# Patient Record
Sex: Female | Born: 1955 | Race: Black or African American | Hispanic: No | Marital: Married | Smoking: Never smoker
Health system: Southern US, Community
[De-identification: ages and names within clinical notes are randomized; demographics above are authoritative.]

## PROBLEM LIST (undated history)

## (undated) DIAGNOSIS — T699XXD Effect of reduced temperature, unspecified, subsequent encounter: Secondary | ICD-10-CM

## (undated) DIAGNOSIS — E669 Obesity, unspecified: Secondary | ICD-10-CM

## (undated) DIAGNOSIS — S86019A Strain of unspecified Achilles tendon, initial encounter: Secondary | ICD-10-CM

## (undated) DIAGNOSIS — D890 Polyclonal hypergammaglobulinemia: Secondary | ICD-10-CM

## (undated) DIAGNOSIS — D649 Anemia, unspecified: Principal | ICD-10-CM

## (undated) DIAGNOSIS — I2699 Other pulmonary embolism without acute cor pulmonale: Secondary | ICD-10-CM

## (undated) DIAGNOSIS — B009 Herpesviral infection, unspecified: Secondary | ICD-10-CM

## (undated) DIAGNOSIS — J45909 Unspecified asthma, uncomplicated: Secondary | ICD-10-CM

## (undated) DIAGNOSIS — R7303 Prediabetes: Secondary | ICD-10-CM

## (undated) DIAGNOSIS — Z86718 Personal history of other venous thrombosis and embolism: Secondary | ICD-10-CM

## (undated) DIAGNOSIS — J189 Pneumonia, unspecified organism: Secondary | ICD-10-CM

## (undated) DIAGNOSIS — I10 Essential (primary) hypertension: Secondary | ICD-10-CM

## (undated) DIAGNOSIS — E559 Vitamin D deficiency, unspecified: Secondary | ICD-10-CM

## (undated) DIAGNOSIS — M1990 Unspecified osteoarthritis, unspecified site: Secondary | ICD-10-CM

## (undated) DIAGNOSIS — R609 Edema, unspecified: Secondary | ICD-10-CM

## (undated) HISTORY — DX: Edema, unspecified: R60.9

## (undated) HISTORY — DX: Vitamin D deficiency, unspecified: E55.9

## (undated) HISTORY — DX: Herpesviral infection, unspecified: B00.9

## (undated) HISTORY — DX: Essential (primary) hypertension: I10

## (undated) HISTORY — DX: Strain of unspecified achilles tendon, initial encounter: S86.019A

## (undated) HISTORY — PX: BREAST BIOPSY: SHX20

## (undated) HISTORY — DX: Other pulmonary embolism without acute cor pulmonale: I26.99

## (undated) HISTORY — DX: Polyclonal hypergammaglobulinemia: D89.0

## (undated) HISTORY — DX: Obesity, unspecified: E66.9

## (undated) HISTORY — DX: Anemia, unspecified: D64.9

## (undated) HISTORY — DX: Personal history of other venous thrombosis and embolism: Z86.718

---

## 2006-04-17 ENCOUNTER — Ambulatory Visit: Payer: Self-pay | Admitting: Hematology & Oncology

## 2006-05-03 LAB — PROTIME-INR

## 2006-06-12 ENCOUNTER — Encounter: Payer: Self-pay | Admitting: Sports Medicine

## 2006-06-20 ENCOUNTER — Ambulatory Visit: Payer: Self-pay | Admitting: Oncology

## 2006-07-14 ENCOUNTER — Ambulatory Visit: Payer: Self-pay | Admitting: Sports Medicine

## 2006-07-14 DIAGNOSIS — IMO0002 Reserved for concepts with insufficient information to code with codable children: Secondary | ICD-10-CM

## 2006-07-14 DIAGNOSIS — M1710 Unilateral primary osteoarthritis, unspecified knee: Secondary | ICD-10-CM | POA: Insufficient documentation

## 2006-07-24 LAB — CBC WITH DIFFERENTIAL/PLATELET
Basophils Absolute: 0 10*3/uL (ref 0.0–0.1)
EOS%: 1.8 % (ref 0.0–7.0)
Eosinophils Absolute: 0.2 10*3/uL (ref 0.0–0.5)
HGB: 11.3 g/dL — ABNORMAL LOW (ref 11.6–15.9)
MCH: 28.3 pg (ref 26.0–34.0)
NEUT#: 6.6 10*3/uL — ABNORMAL HIGH (ref 1.5–6.5)
RDW: 18 % — ABNORMAL HIGH (ref 11.3–14.5)
lymph#: 2.5 10*3/uL (ref 0.9–3.3)

## 2006-07-24 LAB — MORPHOLOGY: PLT EST: ADEQUATE

## 2006-07-28 LAB — BETA-2 GLYCOPROTEIN ANTIBODIES
Beta-2-Glycoprotein I IgA: 5 U/mL (ref <10)
Beta-2-Glycoprotein I IgM: <4 U/mL (ref <10)

## 2006-09-21 ENCOUNTER — Ambulatory Visit: Payer: Self-pay | Admitting: Oncology

## 2006-09-28 LAB — FACTOR 8 ASSAY: Coagulation Factor VIII: 249 % — ABNORMAL HIGH (ref 73–140)

## 2007-01-17 ENCOUNTER — Encounter: Admission: RE | Admit: 2007-01-17 | Discharge: 2007-01-17 | Payer: Self-pay | Admitting: Family Medicine

## 2007-01-24 ENCOUNTER — Ambulatory Visit: Payer: Self-pay | Admitting: Oncology

## 2007-01-29 LAB — CBC WITH DIFFERENTIAL/PLATELET
Basophils Absolute: 0 10*3/uL (ref 0.0–0.1)
EOS%: 4.8 % (ref 0.0–7.0)
Eosinophils Absolute: 0.4 10*3/uL (ref 0.0–0.5)
HCT: 35.2 % (ref 34.8–46.6)
HGB: 11.6 g/dL (ref 11.6–15.9)
MCH: 28.6 pg (ref 26.0–34.0)
NEUT#: 5 10*3/uL (ref 1.5–6.5)
NEUT%: 54.3 % (ref 39.6–76.8)
lymph#: 2.9 10*3/uL (ref 0.9–3.3)

## 2007-01-30 ENCOUNTER — Encounter: Admission: RE | Admit: 2007-01-30 | Discharge: 2007-01-30 | Payer: Self-pay | Admitting: Family Medicine

## 2007-01-30 LAB — FACTOR 8 ASSAY: Coagulation Factor VIII: 266 % — ABNORMAL HIGH (ref 73–140)

## 2007-03-04 ENCOUNTER — Emergency Department (HOSPITAL_COMMUNITY): Admission: EM | Admit: 2007-03-04 | Discharge: 2007-03-05 | Payer: Self-pay | Admitting: Emergency Medicine

## 2007-04-25 ENCOUNTER — Ambulatory Visit: Payer: Self-pay | Admitting: Sports Medicine

## 2007-05-22 ENCOUNTER — Ambulatory Visit: Payer: Self-pay | Admitting: Sports Medicine

## 2007-05-22 DIAGNOSIS — S93499A Sprain of other ligament of unspecified ankle, initial encounter: Secondary | ICD-10-CM

## 2007-05-22 DIAGNOSIS — S96819A Strain of other specified muscles and tendons at ankle and foot level, unspecified foot, initial encounter: Secondary | ICD-10-CM

## 2007-08-21 ENCOUNTER — Ambulatory Visit: Payer: Self-pay | Admitting: Sports Medicine

## 2008-01-25 ENCOUNTER — Ambulatory Visit: Payer: Self-pay | Admitting: Oncology

## 2008-02-01 LAB — CBC WITH DIFFERENTIAL/PLATELET
BASO%: 0.5 % (ref 0.0–2.0)
Basophils Absolute: 0 10*3/uL (ref 0.0–0.1)
EOS%: 3 % (ref 0.0–7.0)
LYMPH%: 25 % (ref 14.0–48.0)
MCH: 27.8 pg (ref 26.0–34.0)
MONO#: 0.5 10*3/uL (ref 0.1–0.9)
NEUT#: 4.9 10*3/uL (ref 1.5–6.5)
Platelets: 403 10*3/uL — ABNORMAL HIGH (ref 145–400)
RDW: 16.5 % — ABNORMAL HIGH (ref 11.3–14.5)
lymph#: 1.9 10*3/uL (ref 0.9–3.3)

## 2008-02-01 LAB — PROTHROMBIN TIME: INR: 2.5 — ABNORMAL HIGH (ref 0.0–1.5)

## 2008-02-06 LAB — IRON AND TIBC
%SAT: 13 % — ABNORMAL LOW (ref 20–55)
Iron: 43 ug/dL (ref 42–145)
TIBC: 328 ug/dL (ref 250–470)

## 2008-02-06 LAB — IMMUNOFIXATION ELECTROPHORESIS: IgM, Serum: 95 mg/dL (ref 60–263)

## 2008-02-19 LAB — ANA: Anti Nuclear Antibody(ANA): NEGATIVE

## 2008-02-19 LAB — HEPATITIS C ANTIBODY: HCV Ab: NEGATIVE

## 2008-02-19 LAB — HEPATIC FUNCTION PANEL
ALT: 10 U/L (ref 0–35)
Alkaline Phosphatase: 73 U/L (ref 39–117)
Bilirubin, Direct: 0.1 mg/dL (ref 0.0–0.3)
Total Bilirubin: 0.4 mg/dL (ref 0.3–1.2)

## 2008-03-18 ENCOUNTER — Encounter: Admission: RE | Admit: 2008-03-18 | Discharge: 2008-03-18 | Payer: Self-pay | Admitting: Family Medicine

## 2008-04-10 ENCOUNTER — Other Ambulatory Visit: Admission: RE | Admit: 2008-04-10 | Discharge: 2008-04-10 | Payer: Self-pay | Admitting: Family Medicine

## 2008-05-22 ENCOUNTER — Ambulatory Visit: Payer: Self-pay | Admitting: Oncology

## 2008-05-26 LAB — IRON AND TIBC
%SAT: 23 % (ref 20–55)
Iron: 69 ug/dL (ref 42–145)

## 2008-05-26 LAB — CBC & DIFF AND RETIC
BASO%: 0.1 % (ref 0.0–2.0)
Basophils Absolute: 0 10*3/uL (ref 0.0–0.1)
EOS%: 3.1 % (ref 0.0–7.0)
Eosinophils Absolute: 0.2 10*3/uL (ref 0.0–0.5)
LYMPH%: 22.4 % (ref 14.0–49.7)
MONO#: 0.4 10*3/uL (ref 0.1–0.9)
MONO%: 6 % (ref 0.0–14.0)
NEUT%: 68.4 % (ref 38.4–76.8)
Platelets: 309 10*3/uL (ref 145–400)
RBC: 4.03 10*6/uL (ref 3.70–5.45)
WBC: 7.3 10*3/uL (ref 3.9–10.3)
lymph#: 1.6 10*3/uL (ref 0.9–3.3)

## 2008-05-26 LAB — FERRITIN: Ferritin: 27 ng/mL (ref 10–291)

## 2008-05-26 LAB — MORPHOLOGY: PLT EST: ADEQUATE

## 2008-11-14 ENCOUNTER — Ambulatory Visit: Payer: Self-pay | Admitting: Oncology

## 2008-11-20 LAB — CBC & DIFF AND RETIC
EOS%: 5.4 % (ref 0.0–7.0)
Eosinophils Absolute: 0.5 10*3/uL (ref 0.0–0.5)
Immature Retic Fract: 6.5 % (ref 0.00–10.70)
LYMPH%: 25.6 % (ref 14.0–49.7)
MONO#: 0.8 10*3/uL (ref 0.1–0.9)
MONO%: 8.5 % (ref 0.0–14.0)
Platelets: 304 10*3/uL (ref 145–400)
RBC: 4.1 10*6/uL (ref 3.70–5.45)
Retic %: 1.23 % (ref 0.50–1.50)
lymph#: 2.3 10*3/uL (ref 0.9–3.3)

## 2008-11-20 LAB — MORPHOLOGY: PLT EST: ADEQUATE

## 2008-11-21 LAB — IRON AND TIBC
Iron: 41 ug/dL — ABNORMAL LOW (ref 42–145)
UIBC: 278 ug/dL

## 2008-12-17 ENCOUNTER — Ambulatory Visit: Payer: Self-pay | Admitting: Oncology

## 2009-07-01 ENCOUNTER — Encounter: Admission: RE | Admit: 2009-07-01 | Discharge: 2009-07-01 | Payer: Self-pay | Admitting: Family Medicine

## 2009-12-09 ENCOUNTER — Ambulatory Visit: Payer: Self-pay | Admitting: Sports Medicine

## 2009-12-09 DIAGNOSIS — M170 Bilateral primary osteoarthritis of knee: Secondary | ICD-10-CM

## 2009-12-09 DIAGNOSIS — M794 Hypertrophy of (infrapatellar) fat pad: Secondary | ICD-10-CM | POA: Insufficient documentation

## 2010-01-08 ENCOUNTER — Ambulatory Visit: Payer: Self-pay | Admitting: Oncology

## 2010-04-04 ENCOUNTER — Encounter: Payer: Self-pay | Admitting: Family Medicine

## 2010-04-13 NOTE — Assessment & Plan Note (Signed)
Summary: b knee pain x 4-5 mos   Vital Signs:  Patient profile:   55 year old female BP sitting:   124 / 82  Vitals Entered By: Lillia Pauls CMA (December 09, 2009 10:36 AM)  History of Present Illness: 55 yo F here for b/l knee pain.  Knows she has OA in both knees.  Present worse x 4-5 months.  Worse throughout the day  with prolonged standing and walking.  Pain described as achy. Radiates down to her legs as well.  Better with rest. No other new injuries.  No swelling.  No locking/instability.  + popping/clicking. Has had xrays on L knee 3 years ago.  Had gotten CSI at that time and was better. Had some leftover tramadol that is working well.  Possibly intersted in taking daily med for he knees, cannot do NSAIDs because on coumadin. Possibly considering CSI. going to [employer] for weight lifting. Unable to walk for exercise 2/2 knee pain. Also has large anterior leg protrusion that she states has been ultrasounded and it was a "fat pad." Has only had that 1 CSI on L knee, none on R knee.  Physical Exam  General:  overweight-appearing.   Msk:  Knee: Normal to inspection with no erythema or effusion or obvious bony abnormalities. Palpation normal with no warmth.  Mild B/l medial joint line tenderness, no patellar tenderness or condyle tenderness. ROM normal in flexion and extension and lower leg rotation. Ligaments with solid consistent endpoints including ACL, PCL, LCL, MCL. Negative Mcmurray's and provocative meniscal tests. Non painful patellar compression. Patellar and quadriceps tendons unremarkable.  L anterior lower leg with large protrusion that is easily mobile and non tender.    Impression & Recommendations:  Problem # 1:  OSTEOARTHRITIS, KNEES, BILATERAL (ICD-715.96) Got great relief from 1st CSI, but would like to just try meds at this time, though admits to not liking daily medication. - Start tramadol 50 mg two times a day with 1-2 tabs as needed during the day  #90 - Declined CSI b/l today, but will RTC 1 month if decides she wants it - Counseled on continued weight loss, recommended recumbent bike and swimming/water aerobics. - f/u 1 month if no better or would like CSI  Her updated medication list for this problem includes:    Tramadol Hcl 50 Mg Tabs (Tramadol hcl) .Marland Kitchen... 1 by mouth two times a day, then 1-2 as needed during the day.  no more than 4 tabs per day.  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  Complete Medication List: 1)  Voltaren 1 % Gel (Diclofenac sodium) .... Apply 1/4 to 1/2 gram two times a day  to qid as needed to area of heel pain amount: 100 gm rf1 2)  Nitroglycerin 0.2 Mg/hr Pt24 (Nitroglycerin) .... Cut patch in quarters and apply to left achilles tendon in the morning and remove at bedtime. dispense enough for 3 months. 3)  Tramadol Hcl 50 Mg Tabs (Tramadol hcl) .Marland Kitchen.. 1 by mouth two times a day, then 1-2 as needed during the day.  no more than 4 tabs per day. Prescriptions: TRAMADOL HCL 50 MG  TABS (TRAMADOL HCL) 1 by mouth two times a day, then 1-2 as needed during the day.  No more than 4 tabs per day.  #90 x 3   Entered and Authorized by:   Corbin Ade MD  Signed by:   Corbin Ade MD on 12/09/2009   Method used:   Electronically to        [id number] (retail)       [address]       [place], [place]  [address]       Ph: [phone]       Fax: [phone]   RxID:   [id number]

## 2010-08-16 ENCOUNTER — Encounter: Payer: Self-pay | Admitting: Family Medicine

## 2010-08-16 ENCOUNTER — Ambulatory Visit (INDEPENDENT_AMBULATORY_CARE_PROVIDER_SITE_OTHER): Payer: BC Managed Care – PPO | Admitting: Family Medicine

## 2010-08-16 VITALS — BP 115/78 | HR 75 | Ht 66.5 in | Wt 260.0 lb

## 2010-08-16 DIAGNOSIS — M1710 Unilateral primary osteoarthritis, unspecified knee: Secondary | ICD-10-CM

## 2010-08-16 MED ORDER — TRAMADOL HCL 50 MG PO TABS: ORAL_TABLET | ORAL | Status: DC

## 2010-08-17 NOTE — Assessment & Plan Note (Signed)
B CSI 08/2010

## 2010-08-17 NOTE — Progress Notes (Signed)
  Subjective:    Patient ID: Laura [name], female    DOB: 24-Apr-1955, 55 y.o.   MRN: [id number]  HPI Bilateral knee pain that is chronic but worse over the last 2-3 months. The right is worse than the left. Pain is centered in the knee joint and does not radiate. She's noticed no locking or giving way. Pain is 4-6/10 most of the time. It occasionally awakens her from sleep. She has previously had a corticosteroid injection that actually helped for one to 2 years. She would like bilateral corticosteroid injections today if possible.   Review of Systems    denies recent fevers sweats or chills. No recent weight gain or weight loss. Objective:   Physical Exam    GENERAL: Well-developed obese female no acute distress KNEE: Bilaterally she has external changes of osteoarthritis including synovial thickening. She also has fat pad hypertrophy greater on the left than the right. Crepitus bilaterally greater on the left. He sneezed ligamentously intact. There is no effusion. She has full extension full flexion. Negative McMurray.  INJECTION: Patient was given informed consent, signed copy in the chart. Appropriate time out was taken. Area prepped and draped in usual sterile fashion. 1  cc of kenalog plus  4 cc of lidocaine was injected into the bilateral using a(n) anterior approach. The patient tolerated the procedure well. There were no complications. Post procedure instructions were given.     Assessment & Plan:  #1: Bilateral knee osteoarthritis. We discussed how weight loss would improve her long-term outcome. She has previously had good results from steroid injection and has not had one for several years. We discussed at length the evolution of osteoarthritis in the knee and use of corticosteroid treatment. She is aware that she cannot have injection more often than every 3 months but should go as long as possible in between injections. We also discussed pain management. She is only been taking  one or 2 of the 50 mg tramadol tablets a day. We will increase that dose and frequency as needed I have given her a new prescription. Bilateral steroid injections as above. She will followup when necessary.

## 2010-10-20 ENCOUNTER — Other Ambulatory Visit: Payer: Self-pay | Admitting: Family Medicine

## 2010-10-20 DIAGNOSIS — Z1231 Encounter for screening mammogram for malignant neoplasm of breast: Secondary | ICD-10-CM

## 2010-11-22 ENCOUNTER — Ambulatory Visit: Payer: BC Managed Care – PPO

## 2010-12-17 LAB — DIFFERENTIAL
Basophils Absolute: 0
Basophils Relative: 0
Eosinophils Absolute: 0.4
Eosinophils Relative: 5
Lymphocytes Relative: 26
Lymphs Abs: 2.3
Monocytes Absolute: 0.8
Monocytes Relative: 9
Neutro Abs: 5.4
Neutrophils Relative %: 61

## 2010-12-17 LAB — I-STAT 8, (EC8 V) (CONVERTED LAB)
Bicarbonate: 25.2 — ABNORMAL HIGH
Glucose, Bld: 110 — ABNORMAL HIGH
Potassium: 3.9
TCO2: 26
pH, Ven: 7.467 — ABNORMAL HIGH

## 2010-12-17 LAB — CBC
Hemoglobin: 10.9 — ABNORMAL LOW
RBC: 3.89

## 2010-12-17 LAB — PROTIME-INR
INR: 2 — ABNORMAL HIGH
Prothrombin Time: 23.5 — ABNORMAL HIGH

## 2011-02-24 ENCOUNTER — Ambulatory Visit
Admission: RE | Admit: 2011-02-24 | Discharge: 2011-02-24 | Disposition: A | Discharge status: Home / Self Care | Payer: BC Managed Care – PPO | Source: Ambulatory Visit | Attending: Family Medicine | Admitting: Family Medicine

## 2011-02-24 ENCOUNTER — Other Ambulatory Visit: Payer: Self-pay | Admitting: Family Medicine

## 2011-02-24 DIAGNOSIS — J189 Pneumonia, unspecified organism: Secondary | ICD-10-CM

## 2011-02-24 DIAGNOSIS — R0602 Shortness of breath: Secondary | ICD-10-CM

## 2011-02-24 MED ORDER — IOHEXOL 300 MG/ML  SOLN
75.0000 mL | Freq: Once | INTRAMUSCULAR | Status: AC | PRN
  Administered 2011-02-24: 75 mL via INTRAVENOUS

## 2011-03-30 ENCOUNTER — Encounter: Payer: Self-pay | Admitting: Internal Medicine

## 2011-03-30 ENCOUNTER — Telehealth: Payer: Self-pay

## 2011-03-30 ENCOUNTER — Ambulatory Visit (INDEPENDENT_AMBULATORY_CARE_PROVIDER_SITE_OTHER): Payer: BC Managed Care – PPO | Admitting: Internal Medicine

## 2011-03-30 VITALS — BP 124/84 | HR 89 | Temp 97.0°F | Ht 67.0 in | Wt 260.4 lb

## 2011-03-30 DIAGNOSIS — R0600 Dyspnea, unspecified: Secondary | ICD-10-CM

## 2011-03-30 DIAGNOSIS — R0609 Other forms of dyspnea: Secondary | ICD-10-CM

## 2011-03-30 NOTE — Progress Notes (Signed)
Subjective:    Patient ID: Laura [name], female    DOB: 02/03/1956, 56 y.o.   MRN: [id number]  HPI  IOV 03/30/2011  56 year old female. Non-smoker. 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 Has hx of 2 spontaneous  PE  (left lung per hx in 2002 and 2006 - Rx in [facility] [place], sees Dr Cyndie Chime, on lifelong coumadin, Factor '9 leidin" deficiency)  States in Nov 2012 developed cold and after that noticed dyspnea. Saw PMD cxr showed 'pna' and given abx. FU cxr 1 month later showed pna did not clear and so had CT chest and that still showed 'pna' (02/24/11) and had 2nd round of antibiotics. Dyspnea somewhat better but still dyspneic climbing steps and relieved by rest. Rates symptoms as mild (was moderate). But concerned that on fu cxr 03/27/11 abnormalities still persist.  Denies cough. Noted to on ACE inhibitor for bp. No asthma.   REview of radiology shows normal cxr 03/04/2007. BUt in 01/25/2011 there is left diaphragm elevation that persists to this day 03/28/2011. Emailed Dr Fredirick Lathe for her opinion who feels this c/w atelectasis related to diaphragm elevation and no mass seen or mediastinal tumor. Her opinion is slightly different which I agree with is different from Walking her in office today: 185 feet x 3 laps; lowest pulse ox is 90%   Past Medical History  Diagnosis Date  . High blood pressure   . History of blood clots      Family History  Problem Relation Age of Onset  . Multiple myeloma Mother 82    deceased  . Prostate cancer Father      History   Social History  . Marital Status: Single    Spouse Name: N/A    Number of Children: 2  . 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 Smoking status: Never Smoker   . Smokeless tobacco: Never Used  . Alcohol Use: No  . Drug Use: No  . Sexually Active: Not on file   Other Topics Concern  . Not on file   Social History Narrative  . No narrative on file     No Known Allergies    Outpatient Prescriptions Prior to Visit  Medication Sig Dispense Refill  . traMADol (ULTRAM) 50 MG tablet 1-2 tabs by mouth bid / tid prn knee pain  90 tablet  3  . warfarin (COUMADIN) 3 MG tablet Take 3 mg by mouth daily.        Marland Kitchen warfarin (COUMADIN) 4 MG tablet Take 4 mg by mouth daily.                .    Review of Systems  Constitutional: Negative for fever and unexpected weight change.  HENT: Negative for ear pain, nosebleeds, congestion, sore throat, rhinorrhea, sneezing, trouble swallowing, dental problem, postnasal drip and sinus pressure.   Eyes: Negative for redness and itching.  Respiratory: Positive for shortness of breath. Negative for cough, chest tightness and wheezing.   Cardiovascular: Negative for palpitations and leg swelling.  Gastrointestinal: Negative for nausea and vomiting.  Genitourinary: Negative for dysuria.  Musculoskeletal: Negative for joint swelling.  Skin: Negative for rash.  Neurological: Negative for headaches.  Hematological: Bruises/bleeds easily.  Psychiatric/Behavioral: Negative for dysphoric mood. The patient is not nervous/anxious.        Objective:   Physical Exam  Vitals reviewed. Constitutional: She is oriented to person, place, and time. She appears well-developed and  well-nourished. No distress.  HENT:  Head: Normocephalic and atraumatic.  Right Ear: External ear normal.  Left Ear: External ear normal.  Mouth/Throat: Oropharynx is clear and moist. No oropharyngeal exudate.  Eyes: Conjunctivae and EOM are normal. Pupils are equal, round, and reactive to light. Right eye exhibits no discharge. Left eye exhibits no discharge. No scleral icterus.  Neck: Normal range of motion. Neck supple. No JVD present. No tracheal deviation present. No thyromegaly present.  Cardiovascular: Normal rate, regular rhythm, normal heart sounds and intact distal pulses.  Exam reveals no gallop and no friction rub.   No murmur  heard. Pulmonary/Chest: Effort normal. No respiratory distress. She has no wheezes. She has no rales. She exhibits no tenderness.       Diminished on left base   Abdominal: Soft. Bowel sounds are normal. She exhibits no distension and no mass. There is no tenderness. There is no rebound and no guarding.  Musculoskeletal: Normal range of motion. She exhibits no edema and no tenderness.  Lymphadenopathy:    She has no cervical adenopathy.  Neurological: She is alert and oriented to person, place, and time. She has normal reflexes. No cranial nerve deficit. She exhibits normal muscle tone. Coordination normal.  Skin: Skin is warm and dry. No rash noted. She is not diaphoretic. No erythema. No pallor.  Psychiatric: She has a normal mood and affect. Her behavior is normal. Judgment and thought content normal.          Assessment & Plan:

## 2011-03-30 NOTE — Patient Instructions (Addendum)
I will talk to one of our raddiologist and get back to you over next several days Today my nurse will walk you and test for oxygen levels  ................ Post ov plan  - get sniff test  - get full pft  - fu after above

## 2011-03-30 NOTE — Telephone Encounter (Signed)
Consult w/ MR was just cancelled for today- I am calling shirley to advise of this opening. Hazel Sams

## 2011-04-01 ENCOUNTER — Encounter: Payer: Self-pay | Admitting: Internal Medicine

## 2011-04-01 ENCOUNTER — Telehealth: Payer: Self-pay | Admitting: Internal Medicine

## 2011-04-01 DIAGNOSIS — R0600 Dyspnea, unspecified: Secondary | ICD-10-CM | POA: Insufficient documentation

## 2011-04-01 NOTE — Assessment & Plan Note (Signed)
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 I suspect that the pulmonary opacity in left lower lobe is dependent atlectasis. Not sure why she has raise left hemidiphragm on left side. No obvious tumor. Will get PFT and SNIFF test and review  Note: plan created after she was seen in office.

## 2011-04-01 NOTE — Telephone Encounter (Signed)
Attempted to reach patient on cell/home phone x 2 - went to some one else. Called her at work but she was in meeting and someone else picked up and for privacy reasons did not reveal that it was MD calling  Please let patient know that I d/w radiologist and we feel that the left diaprhagm is weak and is unlikely pneumonia. Please arrange for SNIFF test and full PFT and come back and see me after that  Need her to get records from [facility] about her PE. She might says that is wiuth Dr Sheldon Silvan here about the PE

## 2011-04-05 NOTE — Telephone Encounter (Signed)
[email or web address].Nary Sneed, CMA  

## 2011-04-06 NOTE — Telephone Encounter (Signed)
I spoke with the pt and advised of results and appt set for pft on 04-12-11 at 9 am. Order placed for SNIFF test. Carron Curie, CMA

## 2011-04-11 ENCOUNTER — Other Ambulatory Visit (HOSPITAL_COMMUNITY): Payer: BC Managed Care – PPO

## 2011-04-12 ENCOUNTER — Ambulatory Visit (INDEPENDENT_AMBULATORY_CARE_PROVIDER_SITE_OTHER): Payer: BC Managed Care – PPO | Admitting: Internal Medicine

## 2011-04-12 DIAGNOSIS — R0989 Other specified symptoms and signs involving the circulatory and respiratory systems: Secondary | ICD-10-CM

## 2011-04-12 DIAGNOSIS — R0600 Dyspnea, unspecified: Secondary | ICD-10-CM

## 2011-04-12 LAB — PULMONARY FUNCTION TEST

## 2011-04-12 NOTE — Progress Notes (Signed)
PFT done today. 

## 2011-04-15 ENCOUNTER — Ambulatory Visit (HOSPITAL_COMMUNITY)
Admission: RE | Admit: 2011-04-15 | Discharge: 2011-04-15 | Disposition: A | Discharge status: Home / Self Care | Payer: BC Managed Care – PPO | Source: Ambulatory Visit | Attending: Internal Medicine | Admitting: Internal Medicine

## 2011-04-15 DIAGNOSIS — J986 Disorders of diaphragm: Secondary | ICD-10-CM | POA: Insufficient documentation

## 2011-04-15 DIAGNOSIS — R0602 Shortness of breath: Secondary | ICD-10-CM | POA: Insufficient documentation

## 2011-04-15 DIAGNOSIS — R0600 Dyspnea, unspecified: Secondary | ICD-10-CM

## 2011-05-05 ENCOUNTER — Encounter: Payer: Self-pay | Admitting: Internal Medicine

## 2011-05-06 ENCOUNTER — Ambulatory Visit: Payer: BC Managed Care – PPO | Admitting: Internal Medicine

## 2011-05-09 ENCOUNTER — Ambulatory Visit (INDEPENDENT_AMBULATORY_CARE_PROVIDER_SITE_OTHER): Payer: BC Managed Care – PPO | Admitting: Internal Medicine

## 2011-05-09 ENCOUNTER — Encounter: Payer: Self-pay | Admitting: Internal Medicine

## 2011-05-09 VITALS — BP 116/72 | HR 73 | Temp 97.8°F | Ht 67.0 in | Wt 258.4 lb

## 2011-05-09 DIAGNOSIS — J986 Disorders of diaphragm: Secondary | ICD-10-CM

## 2011-05-09 NOTE — Progress Notes (Signed)
Subjective:    Patient ID: Laura [name], female    DOB: 1955-09-30, 56 y.o.   MRN: [id number]  HPI  OV 03/30/11:  56 year old female. 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 Has hx of 2 spontaneous  PE  (left lung per hx in 2002 and 2006 - Rx in [facility] [place], sees Dr Cyndie Chime, on lifelong coumadin, Factor '9 leidin" deficiency). Body mass index is 40.47 kg/(m^2).   States in Nov 2012 developed cold and after that noticed dyspnea. Saw PMD cxr showed 'pna' and given abx. FU cxr 1 month later showed pna did not clear and so had CT chest and that still showed 'pna' (02/24/11) and had 2nd round of antibiotics. Dyspnea somewhat better but still dyspneic climbing steps and relieved by rest. Rates symptoms as mild (was moderate). But concerned that on fu cxr 03/27/11 abnormalities still persist.  Denies cough. Noted to on ACE inhibitor for bp. No asthma.   REview of radiology shows normal cxr 03/04/2007. BUt in 01/25/2011 there is left diaphragm elevation that persists to this day 03/28/2011. Emailed Dr Fredirick Lathe for her opinion who feels this c/w atelectasis related to diaphragm elevation and no mass seen or mediastinal tumor. Walking her in office today: 185 feet x 3 laps; lowest pulse ox is 90%  REC I will talk to one of our raddiologist and get back to you over next several days  Today my nurse will walk you and test for oxygen levels  ................  Post ov plan  - get sniff test  - get full pft  - fu after above   OV 05/09/2011  Telephonhe call to patient 04/01/11: d/w radiologist and we feel that the left diaprhagm is weak and is unlikely pneumonia. Dr Fredirick Lathe feels changes c/w compressive atelectasis.   PFT done 04/12/11: shows significant restriction with low dlco as well but also with positive BD response. TLC 2.9L/52%. 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 Dyspnea and cough and symptoms resolved. Just mild  baseline dyspnea when climbs 2 flight. Weight unchanged. Body mass index is 40.47 kg/(m^2). She has done some reading about diaph paralysis. We went over results  Past, Family, Social reviewed: no change since last visit       Review of Systems  Constitutional: Negative for fever and unexpected weight change.  HENT: Negative for ear pain, nosebleeds, congestion, sore throat, rhinorrhea, sneezing, trouble swallowing, dental problem, postnasal drip and sinus pressure.   Eyes: Negative for redness and itching.  Respiratory: Negative for cough, chest tightness, shortness of breath and wheezing.   Cardiovascular: Negative for palpitations and leg swelling.  Gastrointestinal: Negative for nausea and vomiting.  Genitourinary: Negative for dysuria.  Musculoskeletal: Negative for joint swelling.  Skin: Negative for rash.  Neurological: Negative for headaches.  Hematological: Does not bruise/bleed easily.  Psychiatric/Behavioral: Negative for dysphoric mood. The patient is not nervous/anxious.        Objective:   Physical Exam Vitals reviewed. Constitutional: She is oriented to person, place, and time. She appears well-developed and well-nourished. No distress.  HENT:  Head: Normocephalic and atraumatic.  Right Ear: External ear normal.  Left Ear: External ear normal.  Mouth/Throat: Oropharynx is clear and moist. No oropharyngeal exudate.  Eyes: Conjunctivae and EOM are normal. Pupils are equal, round, and reactive to light. Right eye exhibits no discharge. Left eye exhibits no discharge. No scleral icterus.  Neck: Normal range of motion. Neck supple. No JVD present. No  tracheal deviation present. No thyromegaly present.  Cardiovascular: Normal rate, regular rhythm, normal heart sounds and intact distal pulses.  Exam reveals no gallop and no friction rub.   No murmur heard. Pulmonary/Chest: Effort normal. No respiratory distress. She has no wheezes. She has no rales. She exhibits no  tenderness.       Diminished on left base  - SIMILAR TO LAST VISIT Abdominal: Soft. Bowel sounds are normal. She exhibits no distension and no mass. There is no tenderness. There is no rebound and no guarding.  Musculoskeletal: Normal range of motion. She exhibits no edema and no tenderness.  Lymphadenopathy:    She has no cervical adenopathy.  Neurological: She is alert and oriented to person, place, and time. She has normal reflexes. No cranial nerve deficit. She exhibits normal muscle tone. Coordination normal.  Skin: Skin is warm and dry. No rash noted. She is not diaphoretic. No erythema. No pallor.  Psychiatric: She has a normal mood and affect. Her behavior is normal. Judgment and thought content normal.           Assessment & Plan:

## 2011-05-09 NOTE — Assessment & Plan Note (Signed)
New in 2012 and new since 2008. 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 Went over potential etiologies: Unable to identify any. LLL infiltrate felt to be compressive atelectasis.   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

## 2011-05-09 NOTE — Patient Instructions (Signed)
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

## 2011-05-17 ENCOUNTER — Ambulatory Visit: Payer: BC Managed Care – PPO | Admitting: Sports Medicine

## 2011-05-27 ENCOUNTER — Other Ambulatory Visit: Payer: Self-pay | Admitting: Oncology

## 2011-05-27 ENCOUNTER — Other Ambulatory Visit (HOSPITAL_BASED_OUTPATIENT_CLINIC_OR_DEPARTMENT_OTHER): Payer: BC Managed Care – PPO | Admitting: Lab

## 2011-05-27 DIAGNOSIS — I2699 Other pulmonary embolism without acute cor pulmonale: Secondary | ICD-10-CM

## 2011-05-27 LAB — CBC WITH DIFFERENTIAL/PLATELET
Basophils Absolute: 0 10*3/uL (ref 0.0–0.1)
EOS%: 2.7 % (ref 0.0–7.0)
HGB: 11.9 g/dL (ref 11.6–15.9)
LYMPH%: 26 % (ref 14.0–49.7)
MCH: 28.8 pg (ref 25.1–34.0)
MCV: 89.3 fL (ref 79.5–101.0)
MONO%: 9.5 % (ref 0.0–14.0)
NEUT%: 61.6 % (ref 38.4–76.8)
Platelets: 332 10*3/uL (ref 145–400)
RDW: 14.9 % — ABNORMAL HIGH (ref 11.2–14.5)

## 2011-05-27 LAB — PROTIME-INR: INR: 2.8 (ref 2.00–3.50)

## 2011-05-30 ENCOUNTER — Ambulatory Visit (HOSPITAL_BASED_OUTPATIENT_CLINIC_OR_DEPARTMENT_OTHER): Payer: BC Managed Care – PPO | Admitting: Oncology

## 2011-05-30 ENCOUNTER — Telehealth: Payer: Self-pay | Admitting: Oncology

## 2011-05-30 ENCOUNTER — Encounter: Payer: Self-pay | Admitting: Oncology

## 2011-05-30 ENCOUNTER — Telehealth: Payer: Self-pay

## 2011-05-30 VITALS — BP 133/89 | HR 81 | Temp 97.0°F | Ht 67.0 in | Wt 257.7 lb

## 2011-05-30 DIAGNOSIS — D649 Anemia, unspecified: Secondary | ICD-10-CM

## 2011-05-30 DIAGNOSIS — I2699 Other pulmonary embolism without acute cor pulmonale: Secondary | ICD-10-CM

## 2011-05-30 DIAGNOSIS — D689 Coagulation defect, unspecified: Secondary | ICD-10-CM

## 2011-05-30 HISTORY — DX: Other pulmonary embolism without acute cor pulmonale: I26.99

## 2011-05-30 LAB — FACTOR 8 ASSAY: Coagulation Factor VIII: 245 % — ABNORMAL HIGH (ref 73–140)

## 2011-05-30 NOTE — Telephone Encounter (Signed)
Pt notified of factor VIII results per Dr Patsy Lager note. dph

## 2011-05-30 NOTE — Telephone Encounter (Signed)
Message copied by Albertha Ghee on Mon May 30, 2011  5:00 PM ------      Message from: Levert Feinstein      Created: Mon May 30, 2011  3:34 PM       Call pt factor 8 level same as her baseline @ 245% of control

## 2011-05-30 NOTE — Telephone Encounter (Signed)
pts appts made and printed for pt aom °

## 2011-05-31 NOTE — Progress Notes (Signed)
Hematology and Oncology Follow Up Visit  Laura [name] [id number] 08-10-1955 56 y.o. 05/31/2011 6:29 PM   Principle Diagnosis: Encounter Diagnoses  Name Primary?  . Coagulopathy   . Pulmonary embolus Yes     Interim History:   Followup visit for this pleasant 56 year old [employer]. 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 First event in November 2005. Recurrence in October 2007. She was first evaluated in this office in July 2008. 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 She has been on full dose Coumadin anticoagulation since that time with no subsequent thrombotic events.  Other than the hypercoagulation testing, additional testing showed  Negative ANA, hepatitis C antibody negative, protein electrophoresis with a mild polyclonal elevation of both IgG and IgA at 2120 mg percent and 441 mg percent respectively. She had a mild normochromic anemia with borderline decrease ferritin of 13. She was put on iron in her hemoglobin started to come up but she has not kept an appointment here since October of 2010. She was advised to get a colonoscopy.  Overall she has done well with no interim medical problems except for an episode of pneumonia in December 2012. 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 Since her last visit her mother died last year at age 31 of complications of multiple myeloma.   Medications: reviewed  Allergies: No Known Allergies  Review of Systems: Constitutional:   No constitutional symptoms Respiratory: See above Cardiovascular:  No chest pain or palpitations Gastrointestinal: No abdominal pain change in bowel habit or hematochezia Genito-Urinary: No vaginal bleeding Musculoskeletal: No muscle or bone pain Neurologic: No headache or change in vision Skin: No rash Remaining ROS negative.  Physical Exam: Blood pressure 133/89, pulse 81, temperature 97 F  (36.1 C), temperature source Oral, height 5\' 7"  (1.702 m), weight 257 lb 11.2 oz (116.892 kg). Wt Readings from Last 3 Encounters:  05/30/11 257 lb 11.2 oz (116.892 kg)  05/09/11 258 lb 6.4 oz (117.209 kg)  03/30/11 260 lb 6.4 oz (118.117 kg)     General appearance: Obese African American woman HENNT: Pharynx no erythema or exudate Lymph nodes: No cervical supraclavicular or axillary adenopathy Breasts: Not examined Lungs: Clear to auscultation resonant to percussion Heart: Regular rhythm no murmur Abdomen: Soft nontender Extremities: Large calves but nontender no edema Vascular: No cyanosis Neurologic: No focal deficit Skin: No rash or ecchymosis  Lab Results: Lab Results  Component Value Date   WBC 6.4 05/27/2011   HGB 11.9 05/27/2011   HCT 36.8 05/27/2011   MCV 89.3 05/27/2011   PLT 332 05/27/2011   MCV 89.3  normal white count differential 62% neutrophils 26 lymphocytes 9 monocytes 3 eosinophils Fracture VIII level 245% which is comparable to previous values obtained through this office in the last 3 years   Chemistry      Component Value Date/Time   NA 137 03/04/2007 2336   K 3.9 03/04/2007 2336   CL 106 03/04/2007 2336   BUN 14 03/04/2007 2336   CREATININE 0.9 03/04/2007 2336      Component Value Date/Time   ALKPHOS 73 02/18/2008 1458   AST 15 02/18/2008 1458   ALT 10 02/18/2008 1458   BILITOT 0.4 02/18/2008 1458       Impression and Plan: #1. 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  #2. Recurrent pulmonary emboli secondary to #1. She remains stable with  no further thrombotic events on chronic Coumadin anticoagulation. We reviewed and updated information about the new oral anticoagulants. Since she is on a stable dose of Coumadin I am recommending that she stay on the Coumadin for now. However, I think over the next year as we gain more experience with the new drugs particularly Xarelto, we will likely switch her over to this drug for  her convenience. I will see her again in one year. She would like to have her Coumadin monitored through our office. I will make arrangements for this. She would like to have her children tested. Her son is 20 her daughter 50. I gave her prescriptions have factor VIII activity levels done through the children's primary care physician's.  #3. Chronic mild normochromic anemia with some component of iron deficiency that was corrected with oral iron replacement. Myeloma screen was negative.   CC:. Dr. Aram Beecham white   Levert Feinstein, MD 3/19/20136:29 PM

## 2011-06-15 ENCOUNTER — Ambulatory Visit (INDEPENDENT_AMBULATORY_CARE_PROVIDER_SITE_OTHER): Payer: BC Managed Care – PPO | Admitting: Sports Medicine

## 2011-06-15 VITALS — BP 115/79

## 2011-06-15 DIAGNOSIS — M1710 Unilateral primary osteoarthritis, unspecified knee: Secondary | ICD-10-CM

## 2011-06-15 NOTE — Assessment & Plan Note (Signed)
Patient has responded well to injection. She would like to do this again today. Under ultrasound guidance I directed injections into the lateral suprapatellar pouch of the right and than the left knee  Consent obtained and verified. Sterile  prep. Furthur cleansed with alcohol. Topical analgesic spray: Ethyl chloride. Joint:left knee and Right knee Approached in typical fashion with: lateral US guided Completed without difficulty Meds: 1 cc of 40 mg Depo-Medrol in 4 cc of lidocaine in each injection  Warning signs for infection and complications were advised  She was advised that injection is effective in about 70% of cases but the duration of benefit ranges from 6 weeks to 6 months She can return for further treatment within 3 months  She will use Tylenol during the day and using tramadol at night also help lessen the pain She will modify her exercise schedule to include both weight bearing and also some nonweightbearing activity to help with her weight loss and aerobic conditioning Needle: Aftercare instructions and Red flags advised.

## 2011-06-15 NOTE — Progress Notes (Signed)
  Subjective:    Patient ID: Laura [name], female    DOB: 11/14/1955, 56 y.o.   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

## 2011-07-29 ENCOUNTER — Other Ambulatory Visit (HOSPITAL_BASED_OUTPATIENT_CLINIC_OR_DEPARTMENT_OTHER): Payer: BC Managed Care – PPO | Admitting: Lab

## 2011-07-29 ENCOUNTER — Ambulatory Visit (HOSPITAL_BASED_OUTPATIENT_CLINIC_OR_DEPARTMENT_OTHER): Payer: BC Managed Care – PPO | Admitting: Pharmacist

## 2011-07-29 DIAGNOSIS — D689 Coagulation defect, unspecified: Secondary | ICD-10-CM

## 2011-07-29 DIAGNOSIS — I2699 Other pulmonary embolism without acute cor pulmonale: Secondary | ICD-10-CM

## 2011-07-29 LAB — CBC & DIFF AND RETIC
Basophils Absolute: 0 10*3/uL (ref 0.0–0.1)
EOS%: 3.4 % (ref 0.0–7.0)
Eosinophils Absolute: 0.2 10*3/uL (ref 0.0–0.5)
HCT: 38.2 % (ref 34.8–46.6)
HGB: 12.1 g/dL (ref 11.6–15.9)
LYMPH%: 36.7 % (ref 14.0–49.7)
MCH: 28.4 pg (ref 25.1–34.0)
MCV: 89.7 fL (ref 79.5–101.0)
MONO%: 8.3 % (ref 0.0–14.0)
NEUT%: 51.3 % (ref 38.4–76.8)
Platelets: 281 10*3/uL (ref 145–400)

## 2011-07-29 LAB — PROTIME-INR
INR: 1.5 — ABNORMAL LOW (ref 2.00–3.50)
Protime: 18 Seconds — ABNORMAL HIGH (ref 10.6–13.4)

## 2011-07-29 NOTE — Progress Notes (Signed)
INR = 1.5 on 7 mg/day for "years" per pt.   She missed 1 dose this week & as a result, we are likely seeing a drop in her INR. Pt previously monitored by Dr. Laurann Montana, MD at [facility] on [facility].  Pt requests that we fax Dr. Cliffton Asters her INR results each time she visits our clinic (ph [phone]; fax [phone]) Diet: consists of salads every day (bagged lettuce) & has 2-3 servings/week of a green vegetable (collards, broccoli, spinach). EtOH: socially on occasion INR slightly low today but pt seems hesitant to increase her dose since she has been on this dose for years without altering dose (other than being held for dental procedures). I will keep her at 7 mg/day & repeat protime in 1 month. Marily Lente, Pharm.D.

## 2011-08-05 ENCOUNTER — Ambulatory Visit
Admission: RE | Admit: 2011-08-05 | Discharge: 2011-08-05 | Disposition: A | Discharge status: Home / Self Care | Payer: BC Managed Care – PPO | Source: Ambulatory Visit | Attending: Family Medicine | Admitting: Family Medicine

## 2011-08-05 DIAGNOSIS — Z1231 Encounter for screening mammogram for malignant neoplasm of breast: Secondary | ICD-10-CM

## 2011-08-26 ENCOUNTER — Telehealth: Payer: Self-pay | Admitting: Pharmacist

## 2011-08-26 ENCOUNTER — Ambulatory Visit: Payer: BC Managed Care – PPO

## 2011-08-26 ENCOUNTER — Other Ambulatory Visit: Payer: BC Managed Care – PPO | Admitting: Lab

## 2011-09-08 ENCOUNTER — Other Ambulatory Visit: Payer: BC Managed Care – PPO | Admitting: Lab

## 2011-09-08 ENCOUNTER — Ambulatory Visit (HOSPITAL_BASED_OUTPATIENT_CLINIC_OR_DEPARTMENT_OTHER): Payer: BC Managed Care – PPO | Admitting: Pharmacist

## 2011-09-08 DIAGNOSIS — D689 Coagulation defect, unspecified: Secondary | ICD-10-CM

## 2011-09-08 DIAGNOSIS — I2699 Other pulmonary embolism without acute cor pulmonale: Secondary | ICD-10-CM

## 2011-09-08 LAB — CBC & DIFF AND RETIC
BASO%: 0.3 % (ref 0.0–2.0)
Eosinophils Absolute: 0.3 10*3/uL (ref 0.0–0.5)
LYMPH%: 30.3 % (ref 14.0–49.7)
MCHC: 32.1 g/dL (ref 31.5–36.0)
MONO#: 0.7 10*3/uL (ref 0.1–0.9)
MONO%: 10.1 % (ref 0.0–14.0)
NEUT#: 4 10*3/uL (ref 1.5–6.5)
RBC: 4.28 10*6/uL (ref 3.70–5.45)
RDW: 15.1 % — ABNORMAL HIGH (ref 11.2–14.5)
Retic %: 1.56 % (ref 0.70–2.10)
WBC: 7.2 10*3/uL (ref 3.9–10.3)
nRBC: 0 % (ref 0–0)

## 2011-09-08 LAB — POCT INR: INR: 2.3

## 2011-09-08 LAB — PROTIME-INR: Protime: 27.6 Seconds — ABNORMAL HIGH (ref 10.6–13.4)

## 2011-09-08 NOTE — Patient Instructions (Signed)
Continue 7 mg/day Return 11/03/11 at 3:30 pm for lab; 3:45 pm for Coumadin clinic 

## 2011-09-08 NOTE — Progress Notes (Signed)
Continue 7 mg/day Return 11/03/11 at 3:30 pm for lab; 3:45 pm for Coumadin clinic

## 2011-11-01 ENCOUNTER — Ambulatory Visit (INDEPENDENT_AMBULATORY_CARE_PROVIDER_SITE_OTHER): Payer: BC Managed Care – PPO | Admitting: Sports Medicine

## 2011-11-01 ENCOUNTER — Encounter: Payer: Self-pay | Admitting: Sports Medicine

## 2011-11-01 ENCOUNTER — Other Ambulatory Visit: Payer: Self-pay | Admitting: *Deleted

## 2011-11-01 VITALS — BP 127/84 | HR 77 | Ht 67.0 in | Wt 250.0 lb

## 2011-11-01 DIAGNOSIS — M25569 Pain in unspecified knee: Secondary | ICD-10-CM

## 2011-11-01 DIAGNOSIS — M1710 Unilateral primary osteoarthritis, unspecified knee: Secondary | ICD-10-CM

## 2011-11-01 MED ORDER — TRAMADOL HCL 50 MG PO TABS: ORAL_TABLET | ORAL | Status: DC

## 2011-11-01 NOTE — Assessment & Plan Note (Signed)
Injection of RT knee today  We will monitor but certainly response seems to be getting shorter  Check XRay results  Trial with compression sleeve

## 2011-11-01 NOTE — Progress Notes (Signed)
  Subjective:    Patient ID: Laura [name], female    DOB: Mar 20, 1955, 56 y.o.   MRN: [id number]  HPI 61 yof with h/o DVT on chronic anticoagulation, HTN, and osteoarthritis presents for right knee pain.  Last steroid injection approximately 4 months ago, had relief for approximately 3 months, but pain has gradually returned.  Mostly aching sensation over lateral aspect of the knee.  Yesterday when walking she felt a sharp pain over the lateral aspect of the knee which made it feel like her knee was going to give out.  She denies any locking or catching of the knee.  She has not noticed increased swelling.  She endorses morning stiffness.  Denies other joint symptoms.       Review of Systems  No fevers, chills, skin rashes, bleeding.       Objective:   Physical Exam General - well appearing, well nourished, good hygeine, nad, morbidly obese HEENT - ncat, mmm, EOMI, no conjunctival injection or scleral icterus Resp - respirations non-labored MS - Antalgic gait.  Right knee with gross effusion, no overlying skin changes.  Tender to palpation over lateral joint line.  Right knee ROM is -10 degrees from full extension, flexion to 130 degrees.  Left knee ROM is -3 degrees from full extension and flexion to 135 degrees.  Right knee ultrasound shows relatively normal menisci.  Lateral suprapatellar effusion noted.note she does not have a large suprapatellar effusion which makes me concerned that her DJD is progressive  Procedure note:  Right knee corticosteroid injection.  Informed consent obtained.  Patient placed in supine position with right knee slightly flexed.  Suprapatellar bursa identified using ultrasound.  Area cleaned with isopropyl alcohol.  1cc of 40mg  Depo-Medrol and 4cc of 1% lidocaine injected.  No complications, no bleeding.  Patient tolerated procedure well.         Assessment & Plan:  56 year old morbidly obese female with osteoarthritis presents for recurrent knee pain.  1.   Right knee osteoarthritis.  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  Injection performed today with relief in office.  Educated on signs of infection or complication.  Continue to use tramadol for pain.  Body helix knee compression sleeve given, to be worn for 2-3 hours per day.  (avoid longer time secondary to increased risk of stasis and thrombosis).  Will obtain plain films of bilateral knees to evaluate severity.    RTC for appointment already scheduled for 11/23/11.

## 2011-11-03 ENCOUNTER — Other Ambulatory Visit: Payer: BC Managed Care – PPO | Admitting: Lab

## 2011-11-03 ENCOUNTER — Ambulatory Visit: Payer: BC Managed Care – PPO

## 2011-11-11 ENCOUNTER — Other Ambulatory Visit (HOSPITAL_BASED_OUTPATIENT_CLINIC_OR_DEPARTMENT_OTHER): Payer: BC Managed Care – PPO | Admitting: Lab

## 2011-11-11 ENCOUNTER — Ambulatory Visit
Admission: RE | Admit: 2011-11-11 | Discharge: 2011-11-11 | Disposition: A | Discharge status: Home / Self Care | Payer: BC Managed Care – PPO | Source: Ambulatory Visit | Attending: Sports Medicine | Admitting: Sports Medicine

## 2011-11-11 ENCOUNTER — Ambulatory Visit (HOSPITAL_BASED_OUTPATIENT_CLINIC_OR_DEPARTMENT_OTHER): Payer: BC Managed Care – PPO | Admitting: Pharmacist

## 2011-11-11 DIAGNOSIS — I2699 Other pulmonary embolism without acute cor pulmonale: Secondary | ICD-10-CM

## 2011-11-11 DIAGNOSIS — D689 Coagulation defect, unspecified: Secondary | ICD-10-CM

## 2011-11-11 DIAGNOSIS — M25569 Pain in unspecified knee: Secondary | ICD-10-CM

## 2011-11-11 LAB — PROTIME-INR
INR: 2.8 (ref 2.00–3.50)
Protime: 33.6 Seconds — ABNORMAL HIGH (ref 10.6–13.4)

## 2011-11-11 LAB — POCT INR: INR: 2.8

## 2011-11-11 LAB — CBC & DIFF AND RETIC
Basophils Absolute: 0 10*3/uL (ref 0.0–0.1)
Eosinophils Absolute: 0.4 10*3/uL (ref 0.0–0.5)
HCT: 40.7 % (ref 34.8–46.6)
HGB: 13.1 g/dL (ref 11.6–15.9)
Immature Retic Fract: 7.6 % (ref 1.60–10.00)
LYMPH%: 29.5 % (ref 14.0–49.7)
MCHC: 32.2 g/dL (ref 31.5–36.0)
MONO#: 0.7 10*3/uL (ref 0.1–0.9)
NEUT%: 55.8 % (ref 38.4–76.8)
Platelets: 270 10*3/uL (ref 145–400)
Retic Ct Abs: 51.08 10*3/uL (ref 33.70–90.70)
WBC: 7.7 10*3/uL (ref 3.9–10.3)
lymph#: 2.3 10*3/uL (ref 0.9–3.3)

## 2011-11-11 MED ORDER — WARFARIN SODIUM 3 MG PO TABS: 3.0000 mg | ORAL_TABLET | Freq: Every day | ORAL | Status: DC

## 2011-11-11 MED ORDER — WARFARIN SODIUM 4 MG PO TABS: 4.0000 mg | ORAL_TABLET | Freq: Every day | ORAL | Status: DC

## 2011-11-11 NOTE — Progress Notes (Signed)
No changes.  INR at goal.  Will continue warfarin 7mg  daily.  Refills called into [facility] for both 3mg  and 4mg  tablets.  Pt will return for PT/INR check in 2 months.  INR results faxed to Dr Cliffton Asters.

## 2011-11-23 ENCOUNTER — Ambulatory Visit (INDEPENDENT_AMBULATORY_CARE_PROVIDER_SITE_OTHER): Payer: BC Managed Care – PPO | Admitting: Sports Medicine

## 2011-11-23 ENCOUNTER — Encounter: Payer: Self-pay | Admitting: Sports Medicine

## 2011-11-23 VITALS — BP 117/84 | HR 85 | Ht 67.0 in | Wt 250.0 lb

## 2011-11-23 DIAGNOSIS — IMO0002 Reserved for concepts with insufficient information to code with codable children: Secondary | ICD-10-CM

## 2011-11-23 DIAGNOSIS — M1710 Unilateral primary osteoarthritis, unspecified knee: Secondary | ICD-10-CM

## 2011-11-23 NOTE — Assessment & Plan Note (Signed)
Patient had injection of the left knee today. Discuss with patient multiple other options. Patient brought up the idea of viscous supplementation. Told her evidence at this time does not show significant improvement over corticosteroid injections. As long as patient continues to have relief for 3 months we will continue the same regimen. If she has only one month of relief we'll consider doing viscous supplementation at that time. Encourage patient to continue with her weight loss which will be the most beneficial for her. Patient will followup again as needed for steroid injections of the knees.

## 2011-11-23 NOTE — Progress Notes (Signed)
  Subjective:    Patient ID: Laura [name], female    DOB: [birth year], 56 y.o.   MRN: [id number]  HPI 10 yof with h/o DVT on chronic anticoagulation, HTN, and osteoarthritis presents for leftknee pain.  Last steroid injection approximately 3 1/2 months ago, had relief for approximately 3 months, but pain has gradually returned.  Mostly aching sensation over medial aspect of the knee.   She denies any locking or catching of the knee.  She has not noticed increased swelling.  She endorses morning stiffness.  Denies other joint symptoms.     Patient has been working on trying to lose weight which he thinks has been beneficial for her knee pain. Patient did have x-rays done on August 20 which shows moderate to severe degenerative joint disease of the medial compartments bilateral knees.  Review of Systems  No fevers, chills, skin rashes, bleeding.       Objective:   Physical Exam Filed Vitals:   11/23/11 0902  BP: 117/84  Pulse: 85    General - well appearing, well nourished, good hygeine, nad, morbidly obese HEENT - ncat, mmm, EOMI, no conjunctival injection or scleral icterus Resp - respirations non-labored MS - Antalgic gait.  Left knee:  Tender to palpation over medial joint line.  Left knee ROM is -5 degrees from full extension, flexion to 120 degrees.  right knee ROM is -5 degrees from full extension and flexion to 135 degrees.  Left knee ultrasound shows relatively normal menisci.  Lateral suprapatellar effusion noted.  Procedure note:  Left knee corticosteroid injection.  Informed consent obtained.  Patient placed in supine position with right knee slightly flexed.  Suprapatellar bursa identified using ultrasound.  Area cleaned with isopropyl alcohol.  1cc of 40mg  Depo-Medrol and 4cc of 1% lidocaine injected.  No complications, no bleeding.  Patient tolerated procedure well.        Assessment & Plan:

## 2011-11-29 ENCOUNTER — Ambulatory Visit: Payer: BC Managed Care – PPO | Admitting: Internal Medicine

## 2011-12-12 ENCOUNTER — Ambulatory Visit (INDEPENDENT_AMBULATORY_CARE_PROVIDER_SITE_OTHER)
Admission: RE | Admit: 2011-12-12 | Discharge: 2011-12-12 | Disposition: A | Discharge status: Home / Self Care | Payer: BC Managed Care – PPO | Source: Ambulatory Visit | Attending: Internal Medicine | Admitting: Internal Medicine

## 2011-12-12 ENCOUNTER — Encounter: Payer: Self-pay | Admitting: Internal Medicine

## 2011-12-12 ENCOUNTER — Ambulatory Visit (INDEPENDENT_AMBULATORY_CARE_PROVIDER_SITE_OTHER): Payer: BC Managed Care – PPO | Admitting: Internal Medicine

## 2011-12-12 VITALS — BP 126/78 | HR 88 | Temp 98.0°F | Ht 67.0 in | Wt 263.6 lb

## 2011-12-12 DIAGNOSIS — R0602 Shortness of breath: Secondary | ICD-10-CM

## 2011-12-12 DIAGNOSIS — J986 Disorders of diaphragm: Secondary | ICD-10-CM

## 2011-12-12 NOTE — Patient Instructions (Addendum)
YOur diaphragm position is back to normal: If there are changes from official report I will let you know YOur breathing test is improved Your walking test is normal as well Suspect residual shortness of breath is weight related Please talk to your Cala Bradford, MD your PMD about weight loss - remember nutrition is most important thing in weight loss REturn as needed

## 2011-12-12 NOTE — Progress Notes (Signed)
Subjective:    Patient ID: Laura [name], female    DOB: 10-May-1955, 56 y.o.   MRN: [id number]  HPI OV 03/30/11:  56 year old female. Non-smoker. 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 Has hx of 2 spontaneous  PE  (left lung per hx in 2002 and 2006 - Rx in [facility], sees Dr Cyndie Chime, on lifelong coumadin, Factor '9 leidin" deficiency). Body mass index is 40.47 kg/(m^2).   States in Nov 2012 developed cold and after that noticed dyspnea. Saw PMD cxr showed 'pna' and given abx. FU cxr 1 month later showed pna did not clear and so had CT chest and that still showed 'pna' (02/24/11) and had 2nd round of antibiotics. Dyspnea somewhat better but still dyspneic climbing steps and relieved by rest. Rates symptoms as mild (was moderate). But concerned that on fu cxr 03/27/11 abnormalities still persist.  Denies cough. Noted to on ACE inhibitor for bp. No asthma.   REview of radiology shows normal cxr 03/04/2007. BUt in 01/25/2011 there is left diaphragm elevation that persists to this day 03/28/2011. Emailed Dr Fredirick Lathe for her opinion who feels this c/w atelectasis related to diaphragm elevation and no mass seen or mediastinal tumor. Walking her in office today: 185 feet x 3 laps; lowest pulse ox is 90%  REC I will talk to one of our raddiologist and get back to you over next several days  Today my nurse will walk you and test for oxygen levels  ................  Post ov plan  - get sniff test  - get full pft  - fu after above   OV 05/09/2011  Telephonhe call to patient 04/01/11: d/w radiologist and we feel that the left diaprhagm is weak and is unlikely pneumonia. Dr Fredirick Lathe feels changes c/w compressive atelectasis.   PFT done 04/12/11: shows significant restriction with low dlco as well but also with positive BD response. TLC 2.9L/52%. 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 Dyspnea and cough and symptoms resolved.  Just mild baseline dyspnea when climbs 2 flight. Weight unchanged. Body mass index is 40.47 kg/(m^2). She has done some reading about diaph paralysis. We went over results  Past, Family, Social reviewed: no change since last visit  REC Glad you feel better  Best way to combat left diaphragm paralysis is weight loss through nutrition and aerobic exercises The abnormality On CT scan in left lung wast not pneumonia but compressive lung collapse at local region due to diaphragm weakness  REturn in 6 months with cxr and spirometry and walk test for oxygen at followup   OV 12/12/2011  Well, Asymptomatic. Has not lost weight. Not had flu shot  Spirpo - fev1 1.44L/59%, FVC 1.9L/62%, Ratio 76/96% - improved signifiantly  CXR - normal status of left diaphragm according to me but per official report - both are in equal line   Body mass index is 41.29 kg/(m^2).   Dg Chest 2 View  12/12/2011  *RADIOLOGY REPORT*  Clinical Data: Diaphragm weakness.  CHEST - 2 VIEW  Comparison: 03/28/2011  Findings: Heart size is normal.  No pleural effusion or edema.  On today's examination the diaphragms appear even.  Linear density at the left base may represent scar or atelectasis.  IMPRESSION:  1.  No acute cardiopulmonary abnormalities. 2.  Symmetric appearance of the hemidiaphragms.   Original Report Authenticated By: Rosealee Albee, M.D.      Review of Systems  Constitutional: Negative for  fever and unexpected weight change.  HENT: Negative for ear pain, nosebleeds, congestion, sore throat, rhinorrhea, sneezing, trouble swallowing, dental problem, postnasal drip and sinus pressure.   Eyes: Negative for redness and itching.  Respiratory: Negative for cough, chest tightness, shortness of breath and wheezing.   Cardiovascular: Negative for palpitations and leg swelling.  Gastrointestinal: Negative for nausea and vomiting.  Genitourinary: Negative for dysuria.  Musculoskeletal: Negative for joint swelling.    Skin: Negative for rash.  Neurological: Negative for headaches.  Hematological: Does not bruise/bleed easily.  Psychiatric/Behavioral: Negative for dysphoric mood. The patient is not nervous/anxious.        Objective:   Physical Exam Vitals reviewed. Constitutional: She is oriented to person, place, and time. She appears well-developed and well-nourished. No distress.  Body mass index is 41.29 kg/(m^2).  HENT:  Head: Normocephalic and atraumatic.  Right Ear: External ear normal.  Left Ear: External ear normal.  Mouth/Throat: Oropharynx is clear and moist. No oropharyngeal exudate.  Eyes: Conjunctivae and EOM are normal. Pupils are equal, round, and reactive to light. Right eye exhibits no discharge. Left eye exhibits no discharge. No scleral icterus.  Neck: Normal range of motion. Neck supple. No JVD present. No tracheal deviation present. No thyromegaly present.  Cardiovascular: Normal rate, regular rhythm, normal heart sounds and intact distal pulses.  Exam reveals no gallop and no friction rub.   No murmur heard. Pulmonary/Chest: Effort normal. No respiratory distress. She has no wheezes. She has no rales. She exhibits no tenderness.       Improved on left base, no longer diminished Abdominal: Soft. Bowel sounds are normal. She exhibits no distension and no mass. There is no tenderness. There is no rebound and no guarding.  Musculoskeletal: Normal range of motion. She exhibits no edema and no tenderness.  Lymphadenopathy:    She has no cervical adenopathy.  Neurological: She is alert and oriented to person, place, and time. She has normal reflexes. No cranial nerve deficit. She exhibits normal muscle tone. Coordination normal.  Skin: Skin is warm and dry. No rash noted. She is not diaphoretic. No erythema. No pallor.  Psychiatric: She has a normal mood and affect. Her behavior is normal. Judgment and thought content normal.          Assessment & Plan:

## 2011-12-13 NOTE — Assessment & Plan Note (Signed)
Clinically and radiologically improved. REsidual dyspnea is only mild and likely due to obesity though with PE in past CTEPH (rare dx) should remain in ddx. I have reassured her from diaphragm stand point. Advised to talk to PMD and enrol in weight loss program. Emphasized nutrition is most important. Followup with me if any recurrence or worsening dyspnea > 50% of this 15 min visit spent in face to face counseling

## 2012-02-03 ENCOUNTER — Other Ambulatory Visit: Payer: BC Managed Care – PPO

## 2012-02-03 ENCOUNTER — Telehealth: Payer: Self-pay | Admitting: Pharmacist

## 2012-02-03 ENCOUNTER — Ambulatory Visit: Payer: BC Managed Care – PPO

## 2012-02-03 NOTE — Telephone Encounter (Signed)
Patient called and left a voicemail on coumadin clinic phone saying she needed to reschedule her coumadin appointment due to work today. Tried calling patient back but was unable to reach at work. Patient said she will call on Monday to reschedule. Pharmacy will follow up. CE

## 2012-02-27 ENCOUNTER — Other Ambulatory Visit (HOSPITAL_BASED_OUTPATIENT_CLINIC_OR_DEPARTMENT_OTHER): Payer: BC Managed Care – PPO | Admitting: Lab

## 2012-02-27 ENCOUNTER — Ambulatory Visit (HOSPITAL_BASED_OUTPATIENT_CLINIC_OR_DEPARTMENT_OTHER): Payer: BC Managed Care – PPO | Admitting: Pharmacist

## 2012-02-27 DIAGNOSIS — I2699 Other pulmonary embolism without acute cor pulmonale: Secondary | ICD-10-CM

## 2012-02-27 DIAGNOSIS — D689 Coagulation defect, unspecified: Secondary | ICD-10-CM

## 2012-02-27 LAB — CBC & DIFF AND RETIC
BASO%: 0.2 % (ref 0.0–2.0)
Basophils Absolute: 0 10*3/uL (ref 0.0–0.1)
EOS%: 5.2 % (ref 0.0–7.0)
HGB: 12.9 g/dL (ref 11.6–15.9)
MCH: 28.6 pg (ref 25.1–34.0)
MCHC: 31.1 g/dL — ABNORMAL LOW (ref 31.5–36.0)
MCV: 92 fL (ref 79.5–101.0)
MONO%: 8.5 % (ref 0.0–14.0)
RBC: 4.51 10*6/uL (ref 3.70–5.45)
RDW: 14.5 % (ref 11.2–14.5)
Retic %: 1.47 % (ref 0.70–2.10)
Retic Ct Abs: 66.3 10*3/uL (ref 33.70–90.70)
lymph#: 2.5 10*3/uL (ref 0.9–3.3)

## 2012-02-27 LAB — PROTIME-INR: INR: 2 (ref 2.00–3.50)

## 2012-02-27 NOTE — Progress Notes (Signed)
Pt continues to be therapeutic with an INR of 2.0 today on current regimen of 7mg  daily. She missed 2 doses last week because she ran out of her medication, but she has refilled it since then. She denies any change in diet or signs and symptoms of bruising/bleeding. Will continue this regimen and follow up in 8 weeks on 04/23/12.

## 2012-02-29 ENCOUNTER — Ambulatory Visit (INDEPENDENT_AMBULATORY_CARE_PROVIDER_SITE_OTHER): Payer: BC Managed Care – PPO | Admitting: Family Medicine

## 2012-02-29 ENCOUNTER — Encounter: Payer: Self-pay | Admitting: Family Medicine

## 2012-02-29 ENCOUNTER — Ambulatory Visit: Payer: BC Managed Care – PPO | Admitting: Family Medicine

## 2012-02-29 VITALS — BP 121/82 | HR 90 | Ht 67.0 in | Wt 259.0 lb

## 2012-02-29 DIAGNOSIS — M1710 Unilateral primary osteoarthritis, unspecified knee: Secondary | ICD-10-CM

## 2012-02-29 NOTE — Patient Instructions (Addendum)
You were given cortisone injections into both of your knees.   Follow up in 3 months in [place] for these if needed. Have fun in [place]!

## 2012-03-01 ENCOUNTER — Encounter: Payer: Self-pay | Admitting: Family Medicine

## 2012-03-01 NOTE — Progress Notes (Signed)
Subjective:    Patient ID: Laura [name], female    DOB: 06/01/1955, 56 y.o.   MRN: [id number]  PCP: Dr. Cliffton Asters  HPI 56 yo F here for bilateral knee pain  Patient has known history of mod-severe DJD of bilateral knees. Last cortisone injections 3 months ago - started wearing off recently and going on a trip to [place]. Has h/o PEs, on anticoagulation with coumadin - last INR in past week was 2.0. Pain currently worse in right knee than left - more lateral than medial but worse medially on left knee.   Past Medical History  Diagnosis Date  . High blood pressure   . History of blood clots   . 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 LISINOPRIL PO Take by mouth daily. Patient unsure of dosage      . traMADol (ULTRAM) 50 MG tablet 1-2 tabs by mouth bid / tid prn knee pain  90 tablet  3  . warfarin (COUMADIN) 4 MG tablet Take 1 tablet (4 mg total) by mouth daily. Take 1 tablet daily along with 3mg  tablet for a total of 7mg  daily.  90 tablet  4    Past Surgical History  Procedure Date  . Cesarean section     1993 and 1997    No Known Allergies  History   Social History  . Marital Status: Single    Spouse Name: N/A    Number of Children: 2  . 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 Smoking status: Never Smoker   . Smokeless tobacco: Never Used  . Alcohol Use: No  . Drug Use: No  . Sexually Active: Not on file   Other Topics Concern  . Not on file   Social History Narrative  . No narrative on file    Family History  Problem Relation Age of Onset  . Multiple myeloma Mother 82    deceased  . Hypertension Mother   . Prostate cancer Father   . Heart attack Neg Hx   . Hyperlipidemia Neg Hx   . Diabetes Neg Hx   . Sudden death Neg Hx     BP 121/82  Pulse 90  Ht 5'  7" (1.702 m)  Wt 259 lb (117.482 kg)  BMI 40.57 kg/m2  Review of Systems See HPI above.    Objective:   Physical Exam Gen: NAD  R knee: No effusion, ecchymoses, other deformity.  2+ crepitation. TTP medial joint line.  No lateral joint line, post patellar facet TTP. ROM 0 - 110 degrees. Negative ant/post drawers. Negative valgus/varus testing. Negative lachmanns. Negative mcmurrays, apleys, patellar apprehension. NV intact distally.  L knee: No effusion, ecchymoses, other deformity.  2+ crepitation. TTP lateral joint line.  No medial joint line, post patellar facet TTP. ROM 0 - 110 degrees. Negative ant/post drawers. Negative valgus/varus testing. Negative lachmanns. Negative mcmurrays, apleys, patellar apprehension. NV intact distally.    Assessment & Plan:  1. Bilateral knee pain - 2/2 DJD.  Went ahead with repeated injections today.  No visible fluid in suprapatellar pouches by ultrasound today.  F/u in 3 months if needed for repeat injection.  After informed written consent, patient was seated on exam table. Right knee was prepped with alcohol swab and utilizing anterolateral approach,  patient's right knee was injected intraarticularly with 3:1 marcaine: depomedrol. Patient tolerated the procedure well without immediate complications.  After informed written consent, patient was seated on exam table. Left knee was prepped with alcohol swab and utilizing anterolateral approach, patient's left knee was injected intraarticularly with 3:1 marcaine: depomedrol. Patient tolerated the procedure well without immediate complications.

## 2012-03-01 NOTE — Assessment & Plan Note (Signed)
2/2 DJD.  Went ahead with repeated injections today.  No visible fluid in suprapatellar pouches by ultrasound today.  F/u in 3 months if needed for repeat injection.  After informed written consent, patient was seated on exam table. Right knee was prepped with alcohol swab and utilizing anterolateral approach, patient's right knee was injected intraarticularly with 3:1 marcaine: depomedrol. Patient tolerated the procedure well without immediate complications.  After informed written consent, patient was seated on exam table. Left knee was prepped with alcohol swab and utilizing anterolateral approach, patient's left knee was injected intraarticularly with 3:1 marcaine: depomedrol. Patient tolerated the procedure well without immediate complications.

## 2012-04-03 ENCOUNTER — Ambulatory Visit (INDEPENDENT_AMBULATORY_CARE_PROVIDER_SITE_OTHER): Payer: BC Managed Care – PPO | Admitting: Sports Medicine

## 2012-04-03 VITALS — BP 130/86 | Ht 67.0 in | Wt 240.0 lb

## 2012-04-03 DIAGNOSIS — M1710 Unilateral primary osteoarthritis, unspecified knee: Secondary | ICD-10-CM

## 2012-04-03 MED ORDER — AMITRIPTYLINE HCL 10 MG PO TABS: 10.0000 mg | ORAL_TABLET | Freq: Every day | ORAL | Status: DC

## 2012-04-03 NOTE — Progress Notes (Signed)
HPI 56 yo female presents for followup of knee pain, complains that right knee is hurting.  She has h/o moderate-severe OA of both knees. States that last month she received corticosteroid injections of both knees, by Dr Pearletha Forge -- she then went to [place] and reportedly walked a lot as well as climbed up and down subway stairs. States after her return on January 2nd, she had significant knee pain with catching sensation and feeling that knee was giving out on her on lateral side of right knee.  Reports that pain is improved by rest, exacerbated by activity, and does not radiate.  States she usually gets good relief with steroid injections.  She does not take NSAIDS on a regular basis as she is on coumadin for h/o PE's.  She has seldom taken aleve which she states has helped relieve her pain.  She has tried tramadol as well, but states that the med makes her tired and she continues to be drowsy after waking up.  Physical Exam: General: in NAD Extremities: 5/5 strength lower extremities.   Right knee:  Warmth noted over patella; unclear if effusion is present; no erythema noted.   TTP of lateral joint line and on varus testing.   Stable on varus and valgus testing. ROM to 100 degrees flex and ~ - 3 of extension. Voluntary guarding noted. Negative anterior drawer, posterior drawer, and Mcmurray's test.  L knee:  No warmth over patella noted.  No erythema or deformity noted. ROM to 120 degrees flex and 0 extesnion  Review of x-rays reveals significant medial compartment arthritis bilaterally and mild spurring

## 2012-04-03 NOTE — Assessment & Plan Note (Signed)
We will try to fit her with an unloader brace and see if this gives her some relief of pain  Keep up using the exercycle and using water aerobics for exercise  Amitriptyline at night and she can take the tramadol at night as well as it makes her sleepy during the day  She continues on Coumadin so I would discourage use of NSAID agents  use good foot support

## 2012-04-19 ENCOUNTER — Encounter: Payer: Self-pay | Admitting: Pharmacist

## 2012-04-19 ENCOUNTER — Telehealth: Payer: Self-pay | Admitting: *Deleted

## 2012-04-19 NOTE — Progress Notes (Signed)
Pt called RN triage line today & s/w Jonna Coup.  Pt asked if she could begin RX for Voltaren gel (NSAID). While the INR may not be affected by this combination, pt may be at higher risk for bleeding.  Pt should use Voltaren gel in moderation. Pt coming 04/23/12 for next INR/CC visit.  Marily Lente, Pharm.D.

## 2012-04-19 NOTE — Telephone Encounter (Signed)
MAY SHE USE VOLTAREN GEL FOR ARTHRITIS IN HER KNEE? CHECKED WITH PHARMACIST, GINA TUCKER. PT. SHOULD USE MODESTLY AND REPORT ANY BLEEDING ISSUES. ALSO CHECKED WITH LISA THOMAS,NP. VERBAL ORDER AND READ BACK TO LISA THOMAS,NP- PT. MAY USE VOLTAREN GEL AS DIRECTED BY PHARMACIST. NOTIFIED PT. OF THE ABOVE INSTRUCTIONS AND REMINDED HER OF APPOINTMENTS ON 04/23/12 FOR LAB AND TO SEE THE PHARMACIST IN THE COUMADIN CLINIC. PT. VOICES UNDERSTANDING.

## 2012-04-23 ENCOUNTER — Other Ambulatory Visit (HOSPITAL_BASED_OUTPATIENT_CLINIC_OR_DEPARTMENT_OTHER): Payer: BC Managed Care – PPO | Admitting: Lab

## 2012-04-23 ENCOUNTER — Ambulatory Visit (HOSPITAL_BASED_OUTPATIENT_CLINIC_OR_DEPARTMENT_OTHER): Payer: BC Managed Care – PPO | Admitting: Pharmacist

## 2012-04-23 DIAGNOSIS — I2699 Other pulmonary embolism without acute cor pulmonale: Secondary | ICD-10-CM

## 2012-04-23 DIAGNOSIS — D689 Coagulation defect, unspecified: Secondary | ICD-10-CM

## 2012-04-23 LAB — CBC & DIFF AND RETIC
Basophils Absolute: 0 10*3/uL (ref 0.0–0.1)
Eosinophils Absolute: 0.2 10*3/uL (ref 0.0–0.5)
HGB: 12.9 g/dL (ref 11.6–15.9)
Immature Retic Fract: 4.3 % (ref 1.60–10.00)
MONO#: 0.6 10*3/uL (ref 0.1–0.9)
NEUT#: 3.9 10*3/uL (ref 1.5–6.5)
Platelets: 316 10*3/uL (ref 145–400)
RBC: 4.49 10*6/uL (ref 3.70–5.45)
RDW: 15.1 % — ABNORMAL HIGH (ref 11.2–14.5)
Retic %: 1.34 % (ref 0.70–2.10)
WBC: 7.2 10*3/uL (ref 3.9–10.3)
nRBC: 0 % (ref 0–0)

## 2012-04-23 LAB — PROTIME-INR
INR: 2.6 (ref 2.00–3.50)
Protime: 31.2 Seconds — ABNORMAL HIGH (ref 10.6–13.4)

## 2012-04-23 NOTE — Progress Notes (Signed)
INR at goal and stable on Coumadin 7mg  daily.  Pt may start taking fish oil which is a possible drug interaction with Coumadin and could increase INR.  I instructed pt to let us know if she starts fish oil.  Pt using Voltaren gel about every other day and is relieving pain.  Will check PT/INR in about 27month with next MD appt.

## 2012-05-17 ENCOUNTER — Other Ambulatory Visit (HOSPITAL_COMMUNITY)
Admission: RE | Admit: 2012-05-17 | Discharge: 2012-05-17 | Disposition: A | Discharge status: Home / Self Care | Payer: BC Managed Care – PPO | Source: Ambulatory Visit | Attending: Family Medicine | Admitting: Family Medicine

## 2012-05-17 ENCOUNTER — Other Ambulatory Visit: Payer: Self-pay | Admitting: Family Medicine

## 2012-05-17 DIAGNOSIS — Z0000 Encounter for general adult medical examination without abnormal findings: Secondary | ICD-10-CM | POA: Insufficient documentation

## 2012-05-29 ENCOUNTER — Telehealth: Payer: Self-pay | Admitting: Oncology

## 2012-05-29 ENCOUNTER — Ambulatory Visit: Payer: BC Managed Care – PPO

## 2012-05-29 ENCOUNTER — Other Ambulatory Visit (HOSPITAL_BASED_OUTPATIENT_CLINIC_OR_DEPARTMENT_OTHER): Payer: BC Managed Care – PPO | Admitting: Lab

## 2012-05-29 ENCOUNTER — Ambulatory Visit (HOSPITAL_BASED_OUTPATIENT_CLINIC_OR_DEPARTMENT_OTHER): Payer: BC Managed Care – PPO | Admitting: Oncology

## 2012-05-29 VITALS — BP 140/84 | HR 79 | Temp 97.6°F | Resp 18 | Ht 67.0 in | Wt 269.7 lb

## 2012-05-29 DIAGNOSIS — D689 Coagulation defect, unspecified: Secondary | ICD-10-CM

## 2012-05-29 DIAGNOSIS — D649 Anemia, unspecified: Secondary | ICD-10-CM

## 2012-05-29 DIAGNOSIS — I2699 Other pulmonary embolism without acute cor pulmonale: Secondary | ICD-10-CM

## 2012-05-29 LAB — PROTIME-INR
INR: 3.3 (ref 2.00–3.50)
Protime: 39.6 Seconds — ABNORMAL HIGH (ref 10.6–13.4)

## 2012-05-29 LAB — CBC & DIFF AND RETIC
Basophils Absolute: 0 10*3/uL (ref 0.0–0.1)
Eosinophils Absolute: 0.3 10*3/uL (ref 0.0–0.5)
HGB: 12.8 g/dL (ref 11.6–15.9)
Immature Retic Fract: 3.3 % (ref 1.60–10.00)
MCV: 90.3 fL (ref 79.5–101.0)
MONO#: 0.7 10*3/uL (ref 0.1–0.9)
MONO%: 8.2 % (ref 0.0–14.0)
NEUT#: 4.7 10*3/uL (ref 1.5–6.5)
RDW: 14.9 % — ABNORMAL HIGH (ref 11.2–14.5)
Retic %: 1.28 % (ref 0.70–2.10)
Retic Ct Abs: 56.7 10*3/uL (ref 33.70–90.70)
WBC: 8.7 10*3/uL (ref 3.9–10.3)
lymph#: 2.9 10*3/uL (ref 0.9–3.3)
nRBC: 0 % (ref 0–0)

## 2012-05-29 NOTE — Telephone Encounter (Signed)
Called pt and left message regarding appt on September 2014 lab and MD

## 2012-05-30 ENCOUNTER — Ambulatory Visit: Payer: Self-pay | Admitting: Pharmacist

## 2012-05-30 NOTE — Progress Notes (Signed)
Pt seen by MD yesterday and left before we could see her in clinic.  Talked with patient via telephone.  Her INR was 3.3 after being a goal of 2-3 for quite some time.  She informed me that she ran out of her 3 mg tablets the last 3-4 days and was just trying to "break a little off a 4mg  tablet".  She has since refilled her 3mg  tablets and she is back on her 7mg  daily dose.  She will continue this dose and we will see you in clinic in 2 months.

## 2012-05-30 NOTE — Patient Instructions (Signed)
Continue 7 mg/day. Return 08/02/11 at 8:00 am for lab and 8:30 am for Coumadin clinic

## 2012-05-30 NOTE — Progress Notes (Signed)
Hematology and Oncology Follow Up Visit  Laura [name] [id number] 01-18-1956 57 y.o. 05/30/2012 2:17 PM   Principle Diagnosis: Encounter Diagnoses  Name Primary?  . Pulmonary embolus   . 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 Recurrent in October 2007. Initial evaluation in our office July 2008. 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 I believe this is a real finding. There is good epidemiologic evidence that this is a risk factor for clotting and the prevalence is highest in [place] and [place] populations. She is on chronic Coumadin anticoagulation. 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 She walks with a limp now. She has had a number of steroid injections. She is considering knee replacement surgery or at least an orthopedic consultation.  Medications: reviewed  Allergies: No Known Allergies  Review of Systems: Constitutional:   No constitutional symptoms Respiratory: No cough or dyspnea Cardiovascular:  No chest pain or palpitations Gastrointestinal: No abdominal pain no change in bowel habit Genito-Urinary: No urinary tract symptoms. Musculoskeletal: See above Neurologic: No headache or change in vision Skin: No rash or ecchymosis Remaining ROS negative.  Physical Exam: Blood pressure 140/84, pulse 79, temperature 97.6 F (36.4 C), temperature source Oral, resp. rate 18, height 5\' 7"  (1.702 m), weight 269 lb 11.2 oz (122.335 kg). Wt Readings from Last 3 Encounters:  05/29/12 269 lb 11.2 oz (122.335 kg)  04/03/12 240 lb (108.863 kg)  02/29/12 259 lb (117.482 kg)     General appearance: Overweight African American woman HENNT: Pharynx no erythema or exudate Lymph nodes: No  lymphadenopathy Breasts: Lungs: Clear to auscultation resonant to percussion Heart: Regular rhythm no murmur Abdomen: Soft, obese, nontender, no mass, no organomegaly Extremities: No edema, no calf tenderness Vascular: No cyanosis Neurologic: Grossly normal Skin: No rash or ecchymosis  Lab Results: Lab Results  Component Value Date   WBC 8.7 05/29/2012   HGB 12.8 05/29/2012   HCT 40.0 05/29/2012   MCV 90.3 05/29/2012   PLT 265 05/29/2012     Chemistry      Component Value Date/Time   NA 137 03/04/2007 2336   K 3.9 03/04/2007 2336   CL 106 03/04/2007 2336   BUN 14 03/04/2007 2336   CREATININE 0.9 03/04/2007 2336      Component Value Date/Time   ALKPHOS 73 02/18/2008 1458   AST 15 02/18/2008 1458   ALT 10 02/18/2008 1458   BILITOT 0.4 02/18/2008 1458        Impression and Plan: #1. 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 History of recurrent pulmonary emboli secondary to #1 She will continue chronic anticoagulation.  #3. Chronic, mild, normochromic anemia Hemoglobin stable at 12.8 g MCV 90. Myeloma screen negative.  #4. Degenerative arthritis. Evaluation in progress. I told R. be happy to work with her orthopedic surgeon around perioperative anticoagulation.  We discussed potential transition to one of the new direct Xa inhibitors, Xarelto. Comfort level is now getting higher since the drug has been on the market for 2 years and there is more data to support its use and an acceptable safety profile as well. We will readdress this issue in the near future.    CC:. Dr Laurann Montana; Dr Ladona Mow, MD 3/19/20142:17 PM

## 2012-07-31 ENCOUNTER — Other Ambulatory Visit: Payer: Self-pay | Admitting: *Deleted

## 2012-07-31 DIAGNOSIS — D689 Coagulation defect, unspecified: Secondary | ICD-10-CM

## 2012-08-01 ENCOUNTER — Ambulatory Visit: Payer: BC Managed Care – PPO | Admitting: Lab

## 2012-08-01 ENCOUNTER — Ambulatory Visit (HOSPITAL_BASED_OUTPATIENT_CLINIC_OR_DEPARTMENT_OTHER): Payer: BC Managed Care – PPO | Admitting: Pharmacist

## 2012-08-01 DIAGNOSIS — I2699 Other pulmonary embolism without acute cor pulmonale: Secondary | ICD-10-CM

## 2012-08-01 DIAGNOSIS — D689 Coagulation defect, unspecified: Secondary | ICD-10-CM

## 2012-08-01 LAB — POCT INR: INR: 1.6

## 2012-08-01 NOTE — Patient Instructions (Signed)
Take 10mg  today only.  On 5/22, continue 7 mg daily. Recheck INR on 08/29/12; Lab at 8 am and coumadin clinic at 8:15 am.

## 2012-08-01 NOTE — Progress Notes (Addendum)
INR slightly below goal. Pt has missed one dose of coumadin in the last week. No changes in diet. Pt has nearly completed a series of Synvisc (hyaluronan) injections in her knee for arthritis. She has on more left. They have been helping. These injections should not cause an interaction with coumadin. Last clotting event was in 12/2005. Take 10mg  today only.  On 5/22, continue 7 mg daily. 7mg  daily has been a stable dose for pt. Recheck INR on 08/29/12; Lab at 8 am and coumadin clinic at 8:15 am. Pt INR is usually checked ~ 2 months. Will recheck INR in 1 month to verify INR returns to therapeutic range. A copy of INR result has been faxed to Dr. Laurann Montana.

## 2012-08-29 ENCOUNTER — Ambulatory Visit: Payer: BC Managed Care – PPO

## 2012-08-29 ENCOUNTER — Other Ambulatory Visit: Payer: BC Managed Care – PPO | Admitting: Lab

## 2012-08-30 ENCOUNTER — Ambulatory Visit: Payer: BC Managed Care – PPO

## 2012-08-30 ENCOUNTER — Other Ambulatory Visit: Payer: BC Managed Care – PPO | Admitting: Lab

## 2012-08-30 ENCOUNTER — Telehealth: Payer: Self-pay | Admitting: Pharmacist

## 2012-08-30 NOTE — Telephone Encounter (Signed)
Pt called to inform of missed appmt.  She is going out of town and stated she will call next week when she is back to reschedule new appmt.

## 2012-09-20 ENCOUNTER — Other Ambulatory Visit (HOSPITAL_BASED_OUTPATIENT_CLINIC_OR_DEPARTMENT_OTHER): Payer: BC Managed Care – PPO | Admitting: Lab

## 2012-09-20 ENCOUNTER — Ambulatory Visit (HOSPITAL_BASED_OUTPATIENT_CLINIC_OR_DEPARTMENT_OTHER): Payer: BC Managed Care – PPO | Admitting: Pharmacist

## 2012-09-20 DIAGNOSIS — I2699 Other pulmonary embolism without acute cor pulmonale: Secondary | ICD-10-CM

## 2012-09-20 DIAGNOSIS — D689 Coagulation defect, unspecified: Secondary | ICD-10-CM

## 2012-09-20 LAB — PROTIME-INR
INR: 2.6 (ref 2.00–3.50)
Protime: 31.2 Seconds — ABNORMAL HIGH (ref 10.6–13.4)

## 2012-09-20 LAB — POCT INR: INR: 2.6

## 2012-09-20 NOTE — Progress Notes (Addendum)
INR within goal today. No problems to report regarding anticoagulation. Pt started Amoxicillin 500mg  po TID x 10 days on 09/18/12. This should have minimal effect, if any, on her INR (it could slightly increase INR). Pt stated that she has been on Amoxicillin in the past and it hasn't effected her INR. No other changes to report. Will continue same dose. She is stable on this dose. Continue 7 mg daily. Return 11/27/12 at 2:15pm for lab, 2:45pm with Dr. Cyndie Chime and 3:15pm for coumadin clinic.   INR results faxed to Dr. Laurann Montana

## 2012-09-20 NOTE — Patient Instructions (Addendum)
Continue 7 mg daily. Return 11/27/12 at 2:15pm for lab, 2:45pm with Dr. Cyndie Chime and 3:15pm for coumadin clinic.

## 2012-11-27 ENCOUNTER — Ambulatory Visit: Payer: BC Managed Care – PPO

## 2012-11-27 ENCOUNTER — Other Ambulatory Visit: Payer: BC Managed Care – PPO | Admitting: Lab

## 2012-11-27 ENCOUNTER — Ambulatory Visit: Payer: BC Managed Care – PPO | Admitting: Oncology

## 2012-11-28 ENCOUNTER — Telehealth: Payer: Self-pay | Admitting: Pharmacist

## 2012-11-28 NOTE — Telephone Encounter (Signed)
Left VM. Asked pt to reschedule lab/CC apt from 11/27/12. Please call [phone] to reschedule.

## 2012-11-29 ENCOUNTER — Encounter: Payer: Self-pay | Admitting: Oncology

## 2012-11-29 NOTE — Progress Notes (Signed)
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 She called at the last minute to reschedule her appointment.

## 2012-12-04 ENCOUNTER — Other Ambulatory Visit: Payer: BC Managed Care – PPO | Admitting: Lab

## 2012-12-04 ENCOUNTER — Telehealth: Payer: Self-pay | Admitting: Pharmacist

## 2012-12-04 ENCOUNTER — Ambulatory Visit: Payer: BC Managed Care – PPO

## 2012-12-04 NOTE — Telephone Encounter (Signed)
Patient FTKA today for lab and coumadin visit. Called patient's home phone and left VM to call back and reschedule  Christell Faith, PharmD

## 2012-12-11 ENCOUNTER — Other Ambulatory Visit: Payer: Self-pay | Admitting: *Deleted

## 2012-12-11 DIAGNOSIS — I2699 Other pulmonary embolism without acute cor pulmonale: Secondary | ICD-10-CM

## 2012-12-11 DIAGNOSIS — D689 Coagulation defect, unspecified: Secondary | ICD-10-CM

## 2012-12-11 MED ORDER — WARFARIN SODIUM 3 MG PO TABS: ORAL_TABLET | ORAL | Status: DC

## 2012-12-11 MED ORDER — WARFARIN SODIUM 4 MG PO TABS: 4.0000 mg | ORAL_TABLET | Freq: Every day | ORAL | Status: DC

## 2012-12-17 ENCOUNTER — Ambulatory Visit: Payer: BC Managed Care – PPO | Admitting: Pharmacist

## 2012-12-17 ENCOUNTER — Telehealth: Payer: Self-pay | Admitting: Pharmacist

## 2012-12-17 DIAGNOSIS — D689 Coagulation defect, unspecified: Secondary | ICD-10-CM

## 2012-12-17 DIAGNOSIS — I2699 Other pulmonary embolism without acute cor pulmonale: Secondary | ICD-10-CM

## 2012-12-17 NOTE — Progress Notes (Signed)
INR = 2.1 drawn at [facility] on 12/14/12 (result received by fax 12/17/12) Pt is on Coumadin 7 mg daily.  She has been maintained on this dose for a while. I called pt & left her vm to remain on same Coumadin dose. We need to see pt in 1 month for next INR check unless she'd rather go to the lab at [facility] to get the INR drawn there.  We can set that up if she'd rather that office draw her labs & we can call her w/ the dosing instructions. Await pt return call to set up her INR check here. Ebony Hail, Pharm.D., CPP 12/17/2012@10 :46 AM

## 2012-12-26 IMAGING — CR DG CHEST 2V
2 series · 2 of 2 positions shown · non-contrast
Comparison: 03/28/2011

CLINICAL DATA: Diaphragm weakness.

CHEST - 2 VIEW

[view not recorded (1 of 2)]
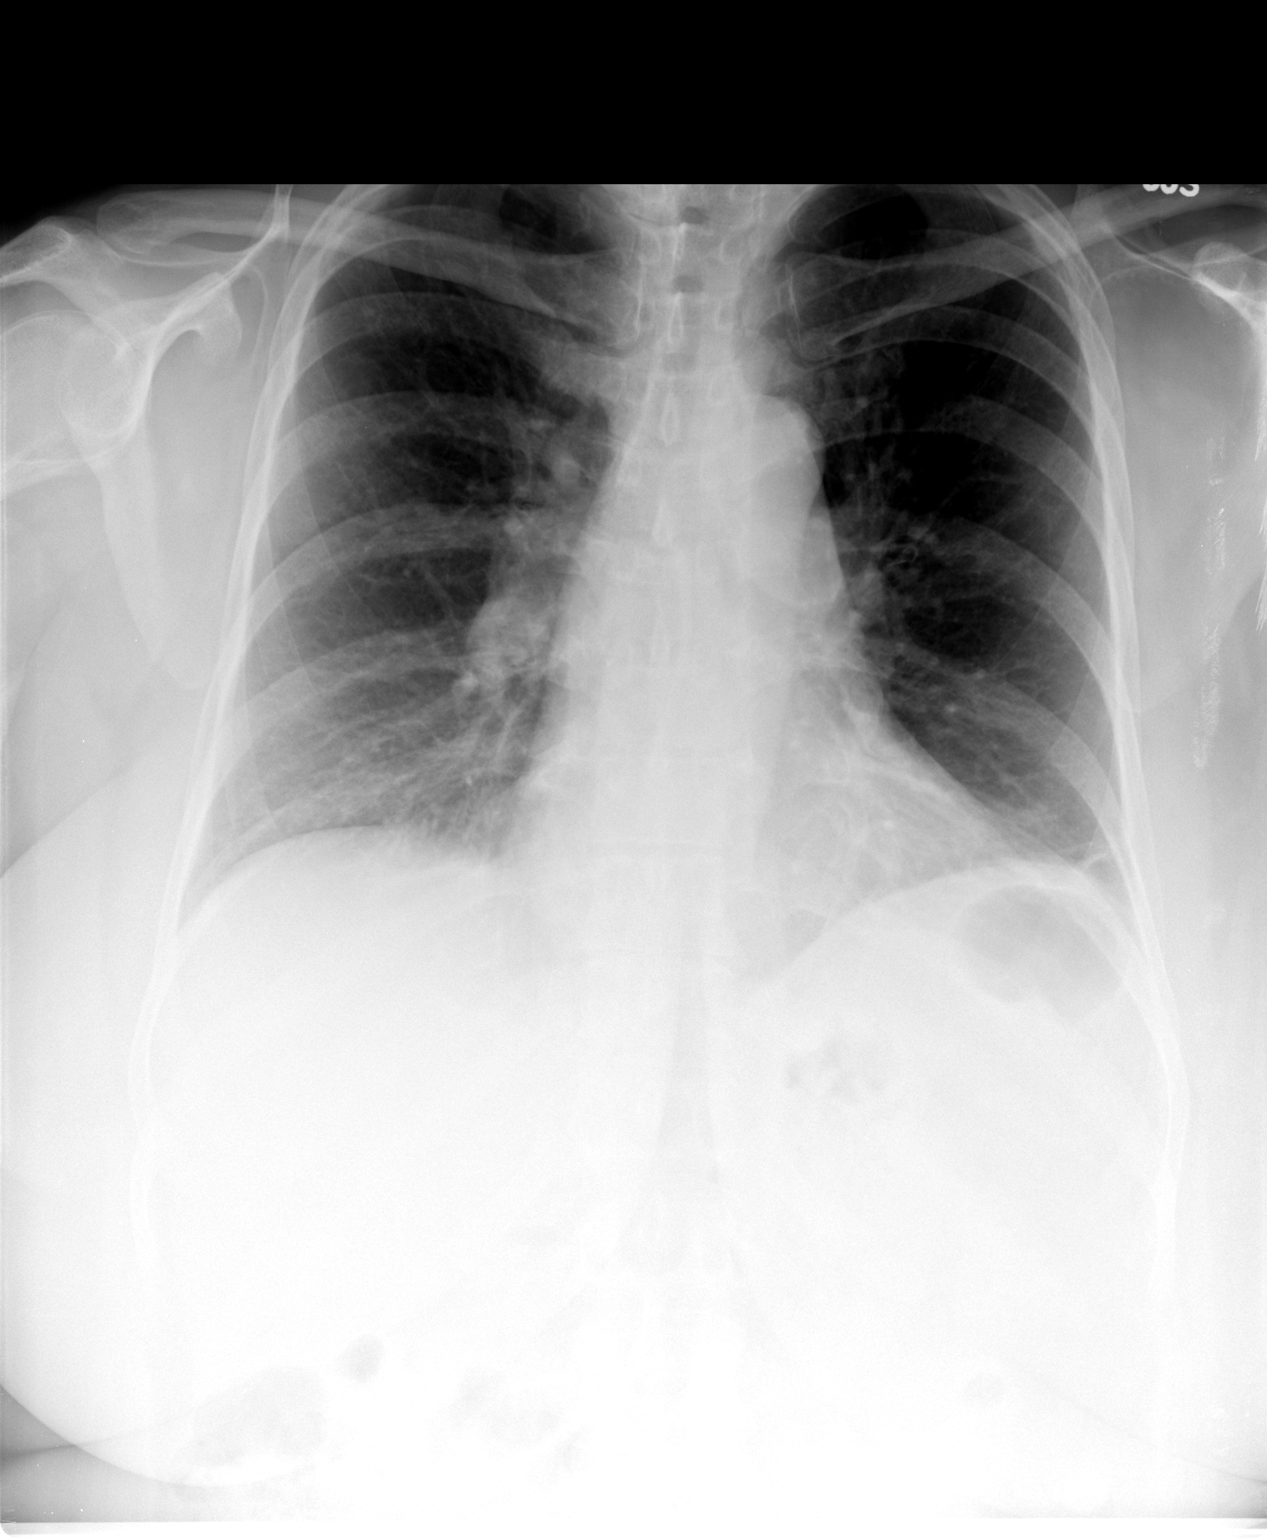

[view not recorded (2 of 2)]
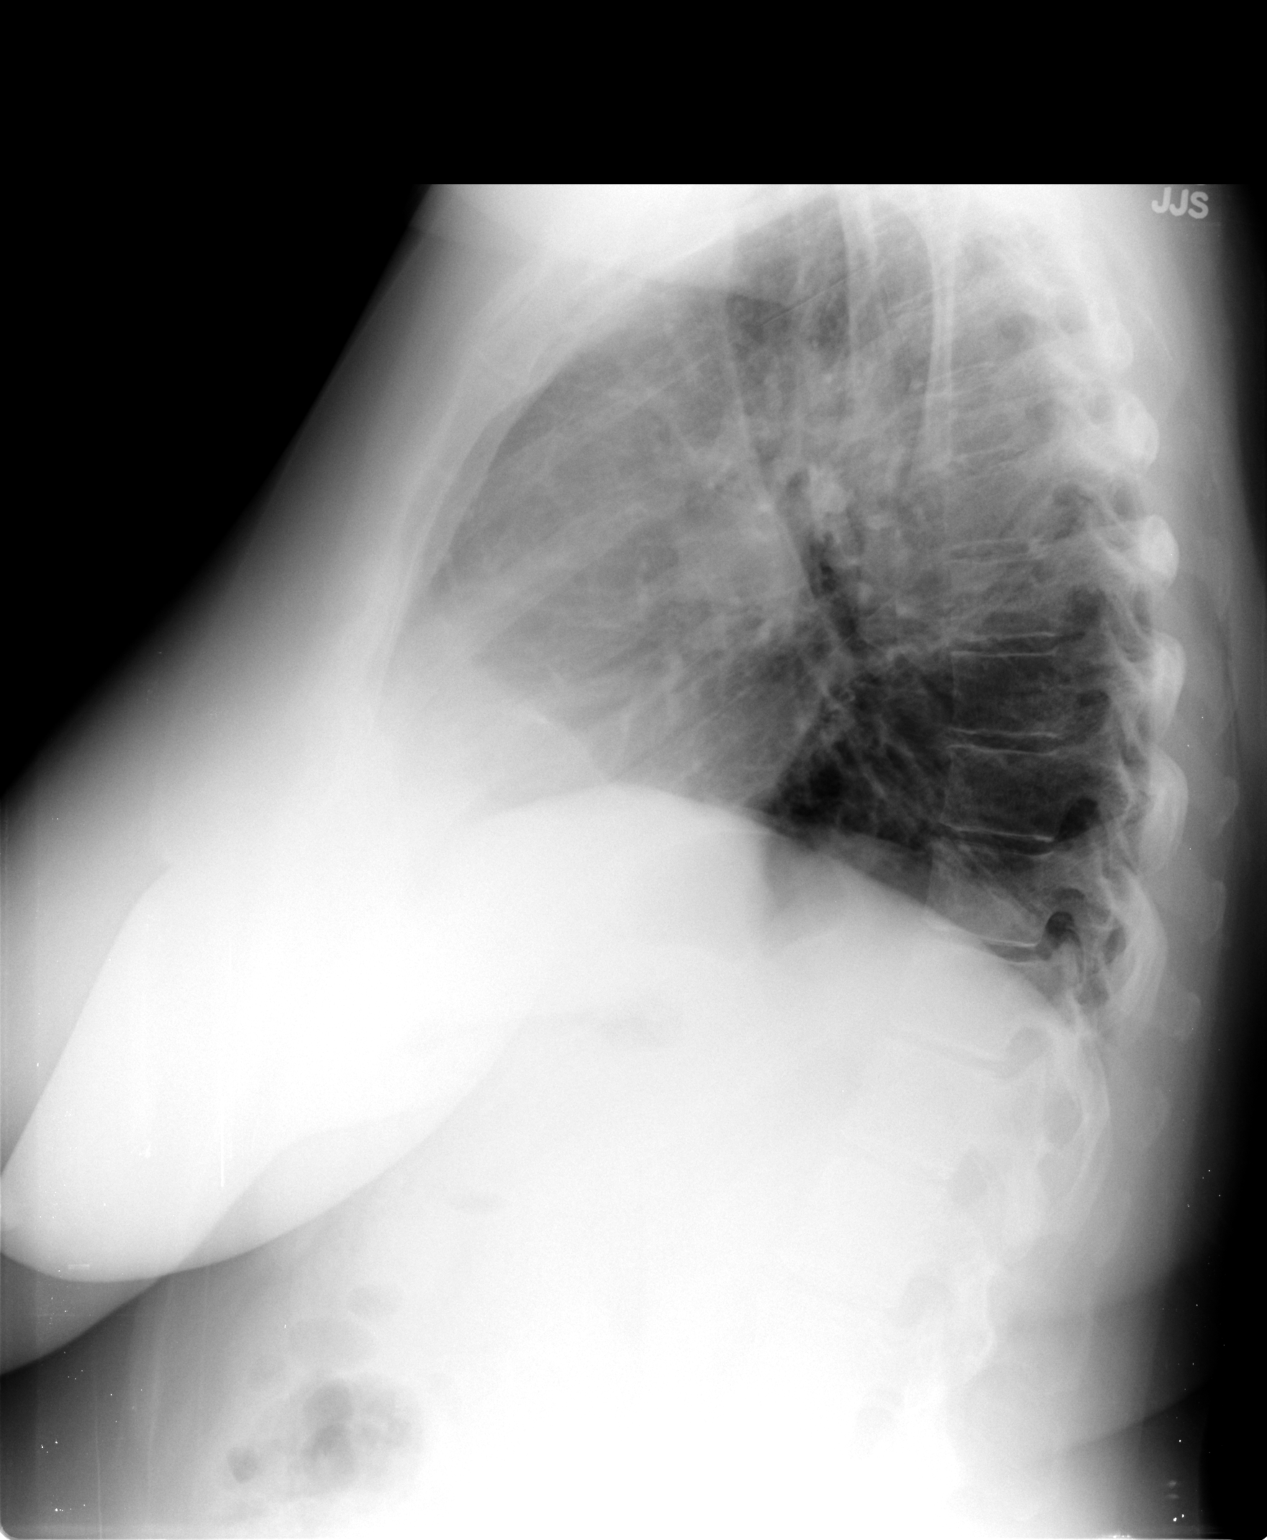

[2 of 2 positions shown; findings below may reference images not displayed]

FINDINGS: Heart size is normal.

No pleural effusion or edema.

On today's examination the diaphragms appear even.

Linear density at the left base may represent scar or atelectasis.
IMPRESSION: 1.  No acute cardiopulmonary abnormalities.
2.  Symmetric appearance of the hemidiaphragms.

## 2013-01-14 ENCOUNTER — Other Ambulatory Visit: Payer: Self-pay | Admitting: *Deleted

## 2013-01-14 DIAGNOSIS — D689 Coagulation defect, unspecified: Secondary | ICD-10-CM

## 2013-01-14 DIAGNOSIS — I2699 Other pulmonary embolism without acute cor pulmonale: Secondary | ICD-10-CM

## 2013-01-14 MED ORDER — WARFARIN SODIUM 3 MG PO TABS: ORAL_TABLET | ORAL | Status: DC

## 2013-01-14 MED ORDER — WARFARIN SODIUM 4 MG PO TABS: 4.0000 mg | ORAL_TABLET | Freq: Every day | ORAL | Status: DC

## 2013-01-28 ENCOUNTER — Ambulatory Visit: Payer: BC Managed Care – PPO

## 2013-01-28 ENCOUNTER — Other Ambulatory Visit: Payer: BC Managed Care – PPO | Admitting: Lab

## 2013-02-11 ENCOUNTER — Ambulatory Visit (HOSPITAL_BASED_OUTPATIENT_CLINIC_OR_DEPARTMENT_OTHER): Payer: BC Managed Care – PPO | Admitting: Pharmacist

## 2013-02-11 ENCOUNTER — Other Ambulatory Visit (HOSPITAL_BASED_OUTPATIENT_CLINIC_OR_DEPARTMENT_OTHER): Payer: BC Managed Care – PPO | Admitting: Lab

## 2013-02-11 DIAGNOSIS — I2699 Other pulmonary embolism without acute cor pulmonale: Secondary | ICD-10-CM

## 2013-02-11 DIAGNOSIS — T699XXD Effect of reduced temperature, unspecified, subsequent encounter: Secondary | ICD-10-CM

## 2013-02-11 DIAGNOSIS — D689 Coagulation defect, unspecified: Secondary | ICD-10-CM

## 2013-02-11 HISTORY — DX: Effect of reduced temperature, unspecified, subsequent encounter: T69.9XXD

## 2013-02-11 LAB — POCT INR: INR: 1.9

## 2013-02-11 LAB — PROTIME-INR: INR: 1.9 — ABNORMAL LOW (ref 2.00–3.50)

## 2013-02-11 NOTE — Progress Notes (Signed)
INR = 1.9    Goal 2-3 Patient continues Coumadin 7 mg daily.   She missed 1 dose last Wednesday while travelling. She has stable at this dose. No complications of anticoagulation noted. She needs refills. Refills x 4 called in to [facility] on [address]. She will return in 8 weeks on 04/12/13 for lab at 3:00 and Coumadin clinic at 3:15.  Cletis Athens, PharmD

## 2013-04-12 ENCOUNTER — Other Ambulatory Visit: Payer: BC Managed Care – PPO

## 2013-04-12 ENCOUNTER — Ambulatory Visit: Payer: BC Managed Care – PPO

## 2013-04-12 ENCOUNTER — Telehealth: Payer: Self-pay | Admitting: Oncology

## 2013-04-12 NOTE — Telephone Encounter (Signed)
wanted to cx and will call back on monday to r/s

## 2013-04-29 ENCOUNTER — Telehealth: Payer: Self-pay | Admitting: Pharmacist

## 2013-04-29 ENCOUNTER — Ambulatory Visit (HOSPITAL_BASED_OUTPATIENT_CLINIC_OR_DEPARTMENT_OTHER): Payer: BC Managed Care – PPO | Admitting: Pharmacist

## 2013-04-29 ENCOUNTER — Other Ambulatory Visit: Payer: BC Managed Care – PPO

## 2013-04-29 ENCOUNTER — Ambulatory Visit: Payer: BC Managed Care – PPO

## 2013-04-29 ENCOUNTER — Ambulatory Visit (HOSPITAL_BASED_OUTPATIENT_CLINIC_OR_DEPARTMENT_OTHER): Payer: BC Managed Care – PPO

## 2013-04-29 DIAGNOSIS — I2699 Other pulmonary embolism without acute cor pulmonale: Secondary | ICD-10-CM

## 2013-04-29 DIAGNOSIS — D689 Coagulation defect, unspecified: Secondary | ICD-10-CM

## 2013-04-29 LAB — POCT INR: INR: 2.3

## 2013-04-29 LAB — PROTIME-INR
INR: 2.3 (ref 2.00–3.50)
Protime: 27.6 Seconds — ABNORMAL HIGH (ref 10.6–13.4)

## 2013-04-29 NOTE — Telephone Encounter (Signed)
Pt left message on Coumadin clinic phone stating she had to go into work today due to weather (snow/ice expected).  She would like to come for lab/CC today at 4 pm. I returned her call to her cell #[phone] & her voicemail was full.  I then left a message at her work [phone] & requested she return my call to reschedule her appts. Ebony HailGinna Anchor Dwan, Pharm.D., CPP 04/29/2013@9 :05 AM

## 2013-04-29 NOTE — Progress Notes (Signed)
INR = 2.3 on Coumadin 7 mg daily No complaints re: anticoag. Only med change is splitting her lisinopril & HCTZ doses into 2 tablets. No missed doses of Coumadin  INR at goal.  No change to Coumadin necessary. Return in 8 weeks. Ebony HailGinna Marelin Tat, Pharm.D., CPP 04/29/2013@3 :55 PM

## 2013-05-11 ENCOUNTER — Telehealth: Payer: Self-pay | Admitting: Oncology

## 2013-05-11 ENCOUNTER — Encounter: Payer: Self-pay | Admitting: Oncology

## 2013-05-11 NOTE — Telephone Encounter (Signed)
, °

## 2013-06-07 ENCOUNTER — Telehealth: Payer: Self-pay | Admitting: Oncology

## 2013-06-07 NOTE — Telephone Encounter (Signed)
LEFT MESSAGE FOR PATIENT TO RETURN CALL IN RE TO TIME CHANGE APPTS.

## 2013-06-24 ENCOUNTER — Ambulatory Visit: Payer: BC Managed Care – PPO

## 2013-06-24 ENCOUNTER — Other Ambulatory Visit: Payer: BC Managed Care – PPO

## 2013-06-24 ENCOUNTER — Telehealth: Payer: Self-pay | Admitting: Pharmacist

## 2013-06-24 NOTE — Telephone Encounter (Signed)
Called patient to r/s missed cc appmt.  Mailbox is full and cannot leave message

## 2013-06-25 ENCOUNTER — Telehealth: Payer: Self-pay | Admitting: Pharmacist

## 2013-06-25 NOTE — Telephone Encounter (Signed)
Ms. Hem's home phone voicemail system is full. I was able to reach her at her work phone. She forgot her coumadin appointment on 06/24/13. Rescheduled to 07/05/13 at 1pm for lab and 1:15pm for coumadin clinic.  Thank you,  Christell Faithhris Shaquile Lutze, PharmD

## 2013-07-05 ENCOUNTER — Telehealth: Payer: Self-pay | Admitting: Pharmacist

## 2013-07-05 ENCOUNTER — Ambulatory Visit: Payer: BC Managed Care – PPO

## 2013-07-05 ENCOUNTER — Other Ambulatory Visit: Payer: BC Managed Care – PPO

## 2013-07-05 NOTE — Telephone Encounter (Signed)
Patient FTKA today for lab and CC. Called and left VM for patient to call back and reschedule  Thank you, Christell Faithhris Britten Parady, PharmD

## 2013-07-13 ENCOUNTER — Other Ambulatory Visit: Payer: Self-pay | Admitting: Oncology

## 2013-07-22 ENCOUNTER — Ambulatory Visit (HOSPITAL_BASED_OUTPATIENT_CLINIC_OR_DEPARTMENT_OTHER): Payer: BC Managed Care – PPO | Admitting: Pharmacist

## 2013-07-22 ENCOUNTER — Other Ambulatory Visit (HOSPITAL_BASED_OUTPATIENT_CLINIC_OR_DEPARTMENT_OTHER): Payer: BC Managed Care – PPO

## 2013-07-22 DIAGNOSIS — I2699 Other pulmonary embolism without acute cor pulmonale: Secondary | ICD-10-CM

## 2013-07-22 DIAGNOSIS — D689 Coagulation defect, unspecified: Secondary | ICD-10-CM

## 2013-07-22 LAB — POCT INR: INR: 2.8

## 2013-07-22 LAB — PROTIME-INR
INR: 2.8 (ref 2.00–3.50)
Protime: 33.6 Seconds — ABNORMAL HIGH (ref 10.6–13.4)

## 2013-07-22 NOTE — Addendum Note (Signed)
Addended by: Neita GoodnightPERSSON, Humaira Sculley L on: 07/22/2013 02:12 PM   Modules accepted: Orders

## 2013-07-22 NOTE — Patient Instructions (Signed)
Continue 7 mg daily.   Recheck INR with scheduled appointments on 09/11/13: lab at 2:45pm, Coumadin clinic at 3pm and Dr. Clelia CroftShadad at 3:15pm.

## 2013-07-22 NOTE — Progress Notes (Addendum)
INR within goal today. No changes in diet. Pt taking Vitamin D 5000 units daily. Lisinopril dose is 10mg  daily. Confirmed dose with pharmacy records. Medication list updated. No missed doses of coumadin. No problems or concerns regarding anticoagulation. No s/s of clotting noted. Continue 7 mg daily.   Recheck INR with scheduled appointments on 09/11/13: lab at 2:45pm, Coumadin clinic at 3pm and Dr. Clelia CroftShadad at 3:15pm. Will order CBC for review with next visit in July. INR results faxed to Dr. Lucilla LameWhite's office at [phone]

## 2013-09-09 ENCOUNTER — Ambulatory Visit: Payer: BC Managed Care – PPO | Admitting: Family Medicine

## 2013-09-10 ENCOUNTER — Encounter: Payer: Self-pay | Admitting: Family Medicine

## 2013-09-10 ENCOUNTER — Ambulatory Visit (INDEPENDENT_AMBULATORY_CARE_PROVIDER_SITE_OTHER): Payer: BC Managed Care – PPO | Admitting: Family Medicine

## 2013-09-10 ENCOUNTER — Encounter (INDEPENDENT_AMBULATORY_CARE_PROVIDER_SITE_OTHER): Payer: Self-pay

## 2013-09-10 VITALS — BP 102/73 | HR 182 | Ht 67.0 in | Wt 250.0 lb

## 2013-09-10 DIAGNOSIS — IMO0002 Reserved for concepts with insufficient information to code with codable children: Secondary | ICD-10-CM

## 2013-09-10 DIAGNOSIS — M25569 Pain in unspecified knee: Secondary | ICD-10-CM

## 2013-09-10 DIAGNOSIS — M1710 Unilateral primary osteoarthritis, unspecified knee: Secondary | ICD-10-CM

## 2013-09-10 DIAGNOSIS — M25561 Pain in right knee: Secondary | ICD-10-CM

## 2013-09-10 DIAGNOSIS — M25562 Pain in left knee: Secondary | ICD-10-CM

## 2013-09-10 MED ORDER — METHYLPREDNISOLONE ACETATE 40 MG/ML IJ SUSP
40.0000 mg | Freq: Once | INTRAMUSCULAR | Status: AC
  Administered 2013-09-10: 40 mg via INTRA_ARTICULAR

## 2013-09-11 ENCOUNTER — Ambulatory Visit: Payer: BC Managed Care – PPO

## 2013-09-11 ENCOUNTER — Other Ambulatory Visit: Payer: BC Managed Care – PPO

## 2013-09-11 ENCOUNTER — Encounter: Payer: Self-pay | Admitting: Family Medicine

## 2013-09-11 ENCOUNTER — Ambulatory Visit: Payer: BC Managed Care – PPO | Admitting: Family Medicine

## 2013-09-11 ENCOUNTER — Ambulatory Visit: Payer: BC Managed Care – PPO | Admitting: Oncology

## 2013-09-11 NOTE — Progress Notes (Signed)
Patient ID: Laura [name], female   DOB: November 18, 1955, 58 y.o.   MRN: [id number]  Subjective:    Patient ID: Laura [name], female    DOB: 1955/06/18, 58 y.o.   MRN: [id number]  PCP: Dr. Dema Severin  Knee Pain   58 yo F here for bilateral knee pain  02/29/12: Patient has known history of mod-severe DJD of bilateral knees. Last cortisone injections 3 months ago - started wearing off recently and going on a trip to [place] Has h/o PEs, on anticoagulation with coumadin - last INR in past week was 2.0. Pain currently worse in right knee than left - more lateral than medial but worse medially on left knee.  09/10/13: Patient returns today for bilateral knee injections. Tried synvisc twice since last visit here. First set helped for 3 months - second one did not help. At least a few months relief with cortisone shots in past. No new injuries.  Past Medical History  Diagnosis Date  . High blood pressure   . History of blood clots   . 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 Cholecalciferol 5000 UNITS capsule Take 5,000 Units by mouth daily.      . hydrochlorothiazide (HYDRODIURIL) 25 MG tablet Take 25 mg by mouth daily.      Marland Kitchen lisinopril (PRINIVIL,ZESTRIL) 10 MG tablet Take 10 mg by mouth daily.      Marland Kitchen warfarin (COUMADIN) 3 MG tablet TAKE 1 TABLET BY MOUTH EVERY DAY  30 tablet  2  . warfarin (COUMADIN) 4 MG tablet TAKE 1 TABLET BY MOUTH EVERY DAY  30 tablet  2   No current facility-administered medications on file prior to visit.    Past Surgical History  Procedure Laterality Date  . Cesarean section      1993 and 1997    No Known Allergies  History   Social History  . Marital Status: Single    Spouse Name: N/A    Number of Children: 2  . 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  Smoking status: Never Smoker   . Smokeless tobacco: Never Used  . Alcohol Use: No  . Drug Use: No  . Sexual Activity: Not on file   Other Topics Concern  . Not on file   Social History Narrative  . No narrative on file    Family History  Problem Relation Age of Onset  . Multiple myeloma Mother 15    deceased  . Hypertension Mother   . Prostate cancer Father   . Heart attack Neg Hx   . Hyperlipidemia Neg Hx   . Diabetes Neg Hx   . Sudden death Neg Hx     BP 102/73  Pulse 182  Ht 5' 7"  (1.702 m)  Wt 250 lb (113.399 kg)  BMI 39.15 kg/m2  Review of Systems See HPI above.    Objective:   Physical Exam Gen: NAD  R knee: No effusion, ecchymoses, other deformity.  2+ crepitation. TTP medial joint line.  No lateral joint line, post patellar facet TTP. ROM 0 - 110 degrees. Negative ant/post drawers. Negative valgus/varus testing. Negative lachmanns. Negative mcmurrays, apleys, patellar apprehension. NV intact distally.  L knee: No effusion, ecchymoses, other deformity.  2+ crepitation. TTP medial joint line.  No lateral joint  line, post patellar facet TTP. ROM 0 - 110 degrees. Negative ant/post drawers. Negative valgus/varus testing. Negative lachmanns. Negative mcmurrays, apleys, patellar apprehension. NV intact distally.    Assessment & Plan:  1. Bilateral knee pain - 2/2 DJD.  Went ahead with repeated injections today.  F/u in 3 months if needed for repeat injection.  After informed written consent, patient was seated on exam table. Right knee was prepped with alcohol swab and utilizing anterolateral approach, patient's right knee was injected intraarticularly with 3:1 marcaine: depomedrol. Patient tolerated the procedure well without immediate complications.  After informed written consent, patient was seated on exam table. Left knee was prepped with alcohol swab and utilizing anterolateral approach, patient's left knee was injected intraarticularly with 3:1  marcaine: depomedrol. Patient tolerated the procedure well without immediate complications.

## 2013-09-11 NOTE — Assessment & Plan Note (Signed)
2/2 DJD.  Went ahead with repeated injections today.  F/u in 3 months if needed for repeat injection.  After informed written consent, patient was seated on exam table. Right knee was prepped with alcohol swab and utilizing anterolateral approach, patient's right knee was injected intraarticularly with 3:1 marcaine: depomedrol. Patient tolerated the procedure well without immediate complications.  After informed written consent, patient was seated on exam table. Left knee was prepped with alcohol swab and utilizing anterolateral approach, patient's left knee was injected intraarticularly with 3:1 marcaine: depomedrol. Patient tolerated the procedure well without immediate complications.

## 2013-09-17 ENCOUNTER — Telehealth: Payer: Self-pay | Admitting: Pharmacist

## 2013-09-18 ENCOUNTER — Encounter: Payer: Self-pay | Admitting: Pharmacist

## 2013-09-18 ENCOUNTER — Other Ambulatory Visit: Payer: Self-pay | Admitting: Pharmacist

## 2013-09-18 DIAGNOSIS — Z7901 Long term (current) use of anticoagulants: Secondary | ICD-10-CM

## 2013-09-18 MED ORDER — WARFARIN SODIUM 3 MG PO TABS: 3.0000 mg | ORAL_TABLET | Freq: Every day | ORAL | Status: DC

## 2013-09-18 MED ORDER — WARFARIN SODIUM 4 MG PO TABS: 4.0000 mg | ORAL_TABLET | Freq: Every day | ORAL | Status: DC

## 2013-09-18 NOTE — Progress Notes (Signed)
[facility] [phone] [address], [place] [id number] Ph# [phone]; Fax# [phone] I refilled both the 3 mg & the 4 mg strengths of Warfarin to this pharmacy rather than [facility] which is documented in refill/current med history.  I was not able to locate this particular [facility] in our database--maybe because it is a new store.  I faxed the [facility]ill [facility] w/ [facility]. Ebony HailGinna Dinara Lupu, Pharm.D., CPP 09/18/2013@10 :19 AM

## 2013-09-20 ENCOUNTER — Other Ambulatory Visit (HOSPITAL_BASED_OUTPATIENT_CLINIC_OR_DEPARTMENT_OTHER): Payer: BC Managed Care – PPO

## 2013-09-20 ENCOUNTER — Ambulatory Visit (HOSPITAL_BASED_OUTPATIENT_CLINIC_OR_DEPARTMENT_OTHER): Payer: BC Managed Care – PPO | Admitting: Pharmacist

## 2013-09-20 DIAGNOSIS — I2699 Other pulmonary embolism without acute cor pulmonale: Secondary | ICD-10-CM

## 2013-09-20 DIAGNOSIS — D689 Coagulation defect, unspecified: Secondary | ICD-10-CM

## 2013-09-20 LAB — POCT INR: INR: 3.7

## 2013-09-20 LAB — PROTIME-INR
INR: 3.7 — AB (ref 2.00–3.50)
PROTIME: 44.4 s — AB (ref 10.6–13.4)

## 2013-09-20 NOTE — Progress Notes (Signed)
Pt seen in clinic today INR=3.7 On 7mg  daily Pt states she had a minor nose bleed this AM No other changes to report Added lasix and potassium to meds, should not effect INR  Will cut dose in half today, taking 3mg  then continue with normal dose of 7mg  daily. RTC in 3 weeks on 10/11/13 at 11:30 lab and 11:45 CC  Will make an annual appmt with Dr. Cyndie ChimeGranfortuna Her last visit was 3/14

## 2013-09-20 NOTE — Patient Instructions (Signed)
Take 3mg  today then continue 7 mg daily.  Recheck INR with scheduled appointments on 10/11/13: lab at 11:30am and Coumadin clinic at 11:45am.

## 2013-10-09 ENCOUNTER — Other Ambulatory Visit: Payer: Self-pay | Admitting: Pharmacist

## 2013-10-09 DIAGNOSIS — I2699 Other pulmonary embolism without acute cor pulmonale: Secondary | ICD-10-CM

## 2013-10-09 DIAGNOSIS — D689 Coagulation defect, unspecified: Secondary | ICD-10-CM

## 2013-10-11 ENCOUNTER — Ambulatory Visit (HOSPITAL_BASED_OUTPATIENT_CLINIC_OR_DEPARTMENT_OTHER): Payer: Self-pay | Admitting: Pharmacist

## 2013-10-11 ENCOUNTER — Other Ambulatory Visit: Payer: BC Managed Care – PPO

## 2013-10-11 DIAGNOSIS — I2699 Other pulmonary embolism without acute cor pulmonale: Secondary | ICD-10-CM

## 2013-10-11 DIAGNOSIS — Z7901 Long term (current) use of anticoagulants: Secondary | ICD-10-CM

## 2013-10-11 DIAGNOSIS — D689 Coagulation defect, unspecified: Secondary | ICD-10-CM

## 2013-10-11 LAB — PROTIME-INR
INR: 3.7 — ABNORMAL HIGH (ref 2.00–3.50)
Protime: 44.4 Seconds — ABNORMAL HIGH (ref 10.6–13.4)

## 2013-10-11 LAB — POCT INR: INR: 3.7

## 2013-10-11 MED ORDER — WARFARIN SODIUM 3 MG PO TABS: 6.0000 mg | ORAL_TABLET | Freq: Every day | ORAL | Status: DC

## 2013-10-11 NOTE — Patient Instructions (Signed)
Take 3mg  today.   On 10/12/13, decrease coumadin to 6mg  daily.   Recheck INR with in 2 weeks on 10/25/13; lab at 3:15pm and coumadin clinic at 3:30pm.

## 2013-10-11 NOTE — Progress Notes (Signed)
INR unchanged from previous visit. INR remains above goal today. Pt took coumadin as instructed.  No missed or extra coumadin doses. No problems or concerns regarding anticoagulation. No unusual bruising. No bleeding. No s/s of clotting noted. No changes in medications. Pt has been eating less amounts of food overall. She is also exercising. She has lost about 10lbs. Pt plans to continue her diet and exercise plans. INR unchanged and elevated x 2 visits. Pt will take 3mg  today. On 10/12/13, decrease coumadin to 6mg  daily. Recheck INR in 2 weeks on 10/25/13; lab at 3:15pm and coumadin clinic at 3:30pm. [facility] [place]. Reminded pt to make office visit appointment with Dr. Cyndie ChimeGranfortuna. INR results today faxed to Dr. Cliffton AstersWhite.

## 2013-10-21 NOTE — Telephone Encounter (Signed)
Phone call - encounter closed. 

## 2013-10-25 ENCOUNTER — Ambulatory Visit (HOSPITAL_BASED_OUTPATIENT_CLINIC_OR_DEPARTMENT_OTHER): Payer: BC Managed Care – PPO | Admitting: Pharmacist

## 2013-10-25 ENCOUNTER — Other Ambulatory Visit (HOSPITAL_BASED_OUTPATIENT_CLINIC_OR_DEPARTMENT_OTHER): Payer: BC Managed Care – PPO

## 2013-10-25 DIAGNOSIS — I2699 Other pulmonary embolism without acute cor pulmonale: Secondary | ICD-10-CM

## 2013-10-25 DIAGNOSIS — D689 Coagulation defect, unspecified: Secondary | ICD-10-CM

## 2013-10-25 DIAGNOSIS — D6859 Other primary thrombophilia: Secondary | ICD-10-CM

## 2013-10-25 LAB — POCT INR: INR: 2.6

## 2013-10-25 LAB — PROTIME-INR
INR: 2.6 (ref 2.00–3.50)
PROTIME: 31.2 s — AB (ref 10.6–13.4)

## 2013-10-25 NOTE — Patient Instructions (Signed)
INR at goal No changes Continue coumadin to 6mg  daily.   Recheck INR with in 8 weeks on 12/20/13; lab at 3:45pm and coumadin clinic at 4pm.

## 2013-10-25 NOTE — Progress Notes (Signed)
INR at goal Pt is doing well with no complaints No missed or extra doses No medication or diet changes No unusual bleeding or bruising Pt states she will call Dr. Patsy LagerGranfortuna's office to set up a follow up appointment Plan: No changes Continue coumadin to 6mg  daily.   Recheck INR with in 8 weeks on 12/20/13; lab at 3:45pm and coumadin clinic at 4pm.

## 2013-11-07 NOTE — Telephone Encounter (Signed)
Encounter was telephone call. 

## 2013-12-20 ENCOUNTER — Telehealth: Payer: Self-pay | Admitting: Pharmacist

## 2013-12-20 ENCOUNTER — Ambulatory Visit: Payer: BC Managed Care – PPO

## 2013-12-20 ENCOUNTER — Other Ambulatory Visit: Payer: BC Managed Care – PPO

## 2013-12-20 NOTE — Telephone Encounter (Signed)
Pt requested to change her lab/CC time from 12/20/13 to 12/24/13. She will have lab at 12:00pm on 12/24/13 and coumadin clinic at 12:15pm.

## 2013-12-23 ENCOUNTER — Telehealth: Payer: Self-pay | Admitting: Oncology

## 2013-12-23 NOTE — Telephone Encounter (Signed)
per Ike BeneKolleen to add CC * lab-pt aware

## 2013-12-24 ENCOUNTER — Other Ambulatory Visit (HOSPITAL_BASED_OUTPATIENT_CLINIC_OR_DEPARTMENT_OTHER): Payer: BC Managed Care – PPO

## 2013-12-24 ENCOUNTER — Ambulatory Visit (HOSPITAL_BASED_OUTPATIENT_CLINIC_OR_DEPARTMENT_OTHER): Payer: BC Managed Care – PPO | Admitting: Pharmacist

## 2013-12-24 DIAGNOSIS — I2699 Other pulmonary embolism without acute cor pulmonale: Secondary | ICD-10-CM

## 2013-12-24 DIAGNOSIS — D689 Coagulation defect, unspecified: Secondary | ICD-10-CM

## 2013-12-24 LAB — PROTIME-INR
INR: 1.8 — ABNORMAL LOW (ref 2.00–3.50)
Protime: 21.6 Seconds — ABNORMAL HIGH (ref 10.6–13.4)

## 2013-12-24 NOTE — Progress Notes (Signed)
INR = 1.8 on Coumadin 6 mg daily Pt states she thinks she missed her dose of Coumadin this past Saturday. Med changes: D/C'd Lisinopril, Lasix & KCl.  Now on HCTZ + Coreg.  No drug interactions w/ Coumadin. No c/o s/sxs of VTE. INR slightly low today.  Likely due to omitting her Saturday dose.  I will leave her dose of Coumadin the same since it has been keeping her INR in goal before today. Return in 4 weeks. Ebony HailGinna Jazzelle Zhang, Pharm.D., CPP 12/24/2013@12 :45 PM

## 2014-01-15 ENCOUNTER — Encounter: Payer: Self-pay | Admitting: Family Medicine

## 2014-01-15 ENCOUNTER — Other Ambulatory Visit: Payer: Self-pay | Admitting: Pharmacist

## 2014-01-15 ENCOUNTER — Ambulatory Visit (INDEPENDENT_AMBULATORY_CARE_PROVIDER_SITE_OTHER): Payer: BC Managed Care – PPO | Admitting: Family Medicine

## 2014-01-15 VITALS — BP 148/96 | HR 79 | Ht 67.0 in | Wt 250.0 lb

## 2014-01-15 DIAGNOSIS — Z7901 Long term (current) use of anticoagulants: Secondary | ICD-10-CM

## 2014-01-15 DIAGNOSIS — M25562 Pain in left knee: Secondary | ICD-10-CM

## 2014-01-15 DIAGNOSIS — M25561 Pain in right knee: Secondary | ICD-10-CM

## 2014-01-15 MED ORDER — METHYLPREDNISOLONE ACETATE 40 MG/ML IJ SUSP
40.0000 mg | Freq: Once | INTRAMUSCULAR | Status: AC
  Administered 2014-01-15: 40 mg via INTRA_ARTICULAR

## 2014-01-15 MED ORDER — WARFARIN SODIUM 3 MG PO TABS: ORAL_TABLET | ORAL | Status: DC

## 2014-01-15 MED ORDER — METHYLPREDNISOLONE ACETATE 40 MG/ML IJ SUSP: 40.0000 mg | Freq: Once | INTRAMUSCULAR | Status: DC

## 2014-01-20 DIAGNOSIS — M25561 Pain in right knee: Secondary | ICD-10-CM | POA: Insufficient documentation

## 2014-01-20 NOTE — Progress Notes (Signed)
Patient ID: Laura [name], female   DOB: [birth year], 58 y.o.   MRN: [id number]  Subjective:    Patient ID: Laura [name], female    DOB: 11-Apr-1955, 58 y.o.   MRN: [id number]  PCP: Dr. Dema Severin  Knee Pain   58 yo F here for bilateral knee pain  02/29/12: Patient has known history of mod-severe DJD of bilateral knees. Last cortisone injections 3 months ago - started wearing off recently and going on a trip to [place] Has h/o PEs, on anticoagulation with coumadin - last INR in past week was 2.0. Pain currently worse in right knee than left - more lateral than medial but worse medially on left knee.  09/10/13: Patient returns today for bilateral knee injections. Tried synvisc twice since last visit here. First set helped for 3 months - second one did not help. At least a few months relief with cortisone shots in past. No new injuries.  01/15/14: Patient reports her knees started bothering her a couple weeks ago. Did well with cortisone shots. Taking tylenol.  Past Medical History  Diagnosis Date  . High blood pressure   . History of blood clots   . 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 carvedilol (COREG) 3.125 MG tablet Take 3.125 mg by mouth 2 (two) times daily with a meal.    . Cholecalciferol 5000 UNITS capsule Take 5,000 Units by mouth daily.    . hydrochlorothiazide (HYDRODIURIL) 25 MG tablet Take 25 mg by mouth daily.     No current facility-administered medications on file prior to visit.    Past Surgical History  Procedure Laterality Date  . Cesarean section      1993 and 1997    No Known Allergies  History   Social History  . Marital Status: Single    Spouse Name: N/A    Number of Children: 2  . 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 Smoking status: Never  Smoker   . Smokeless tobacco: Never Used  . Alcohol Use: No  . Drug Use: No  . Sexual Activity: Not on file   Other Topics Concern  . Not on file   Social History Narrative    Family History  Problem Relation Age of Onset  . Multiple myeloma Mother 66    deceased  . Hypertension Mother   . Prostate cancer Father   . Heart attack Neg Hx   . Hyperlipidemia Neg Hx   . Diabetes Neg Hx   . Sudden death Neg Hx     BP 148/96 mmHg  Pulse 79  Ht 5' 7"  (1.702 m)  Wt 250 lb (113.399 kg)  BMI 39.15 kg/m2  Review of Systems See HPI above.    Objective:   Physical Exam Gen: NAD  R knee: No effusion, ecchymoses, other deformity.  2+ crepitation. TTP medial joint line.  No lateral joint line, post patellar facet TTP. ROM 0 - 110 degrees. Negative ant/post drawers. Negative valgus/varus testing. Negative lachmanns. Negative mcmurrays, apleys, patellar apprehension. NV intact distally.  L knee: No effusion, ecchymoses, other deformity.  2+ crepitation. TTP medial joint line.  No lateral joint line, post patellar facet TTP. ROM 0 - 110 degrees. Negative ant/post drawers. Negative valgus/varus testing. Negative lachmanns. Negative mcmurrays, apleys, patellar apprehension.  NV intact distally.    Assessment & Plan:  1. Bilateral knee pain - 2/2 DJD.  Went ahead with repeated cortisone injections today.  F/u in 3 months if needed for repeat injection.  After informed written consent, patient was seated on exam table. Right knee was prepped with alcohol swab and utilizing anterolateral approach, patient's right knee was injected intraarticularly with 3:1 marcaine: depomedrol. Patient tolerated the procedure well without immediate complications.  After informed written consent, patient was seated on exam table. Left knee was prepped with alcohol swab and utilizing anterolateral approach, patient's left knee was injected intraarticularly with 3:1 marcaine: depomedrol. Patient  tolerated the procedure well without immediate complications.

## 2014-01-20 NOTE — Assessment & Plan Note (Signed)
2/2 DJD.  Went ahead with repeated cortisone injections today.  F/u in 3 months if needed for repeat injection.  After informed written consent, patient was seated on exam table. Right knee was prepped with alcohol swab and utilizing anterolateral approach, patient's right knee was injected intraarticularly with 3:1 marcaine: depomedrol. Patient tolerated the procedure well without immediate complications.  After informed written consent, patient was seated on exam table. Left knee was prepped with alcohol swab and utilizing anterolateral approach, patient's left knee was injected intraarticularly with 3:1 marcaine: depomedrol. Patient tolerated the procedure well without immediate complications.

## 2014-01-21 ENCOUNTER — Other Ambulatory Visit: Payer: BC Managed Care – PPO

## 2014-01-21 ENCOUNTER — Ambulatory Visit: Payer: BC Managed Care – PPO

## 2014-01-21 ENCOUNTER — Telehealth: Payer: Self-pay | Admitting: Pharmacist

## 2014-01-21 NOTE — Telephone Encounter (Signed)
LVM to return our call to r/s CC visit missed on 01/21/14

## 2014-02-04 ENCOUNTER — Ambulatory Visit (HOSPITAL_BASED_OUTPATIENT_CLINIC_OR_DEPARTMENT_OTHER): Payer: BC Managed Care – PPO | Admitting: Pharmacist

## 2014-02-04 ENCOUNTER — Other Ambulatory Visit (HOSPITAL_BASED_OUTPATIENT_CLINIC_OR_DEPARTMENT_OTHER): Payer: BC Managed Care – PPO

## 2014-02-04 DIAGNOSIS — I2699 Other pulmonary embolism without acute cor pulmonale: Secondary | ICD-10-CM

## 2014-02-04 DIAGNOSIS — D689 Coagulation defect, unspecified: Secondary | ICD-10-CM

## 2014-02-04 LAB — PROTIME-INR
INR: 1.9 — AB (ref 2.00–3.50)
Protime: 22.8 Seconds — ABNORMAL HIGH (ref 10.6–13.4)

## 2014-02-04 LAB — POCT INR: INR: 1.9

## 2014-02-04 NOTE — Patient Instructions (Signed)
Change dose to 7mg  daily.   Recheck INR 02/19/14; lab at 11:15am and coumadin clinic at 11:45am.

## 2014-02-04 NOTE — Progress Notes (Signed)
Pt seen in clinic today INR=1.9 on 6mg  daily Suggested an alternating dose with MWF of 7mg  but patient hesitant. Pt requested we go back to what she was on "forvever", 7mg  daily She has had 2 subtherapautic INR's, 1.8 and 1.9 the last two visits. Agreed to 7mg  daily and she return to clinic in 2 weeks. If INR at goal, will continue 7mg  daily Supplied patient with #30 4mg  Coumadin exp 1/17 Lot [id number] Pt [facility] as she has not seen him since G left She plans to discuss options other than coumadin at that visit  Change dose to 7mg  daily.   Recheck INR 02/19/14; lab at 11:15am and coumadin clinic at 11:45am.

## 2014-02-19 ENCOUNTER — Ambulatory Visit: Payer: BC Managed Care – PPO

## 2014-02-19 ENCOUNTER — Other Ambulatory Visit: Payer: BC Managed Care – PPO

## 2014-02-24 ENCOUNTER — Other Ambulatory Visit (HOSPITAL_BASED_OUTPATIENT_CLINIC_OR_DEPARTMENT_OTHER): Payer: BC Managed Care – PPO

## 2014-02-24 ENCOUNTER — Ambulatory Visit (HOSPITAL_BASED_OUTPATIENT_CLINIC_OR_DEPARTMENT_OTHER): Payer: BC Managed Care – PPO | Admitting: Pharmacist

## 2014-02-24 DIAGNOSIS — I2699 Other pulmonary embolism without acute cor pulmonale: Secondary | ICD-10-CM

## 2014-02-24 DIAGNOSIS — D689 Coagulation defect, unspecified: Secondary | ICD-10-CM

## 2014-02-24 LAB — PROTIME-INR
INR: 3.3 (ref 2.00–3.50)
PROTIME: 39.6 s — AB (ref 10.6–13.4)

## 2014-02-24 LAB — POCT INR: INR: 3.3

## 2014-02-24 NOTE — Progress Notes (Signed)
INR = 3.3 on Coumadin 7 mg daily No sxs of bleeding/bruising. No recent med changes. It's possible she may have missed 1 dose of her Coumadin last week sometime. She recently had steroid inj in both knees prior to a [place].  She did not come off Coumadin for this.  Her knee pain is improved. INR slightly elevated today but was low x 2 visits recently so we increased her Coumadin last visit.  She prefers to have the same daily dose as opposed to alternating doses.  I will keep her on 7 mg/day since she has been therapeutic in the past on this dose. Return in 3 weeks.  We discussed diet/EtOH consumption over the holidays.  She states she usually doesn't drink a lot of EtOH during the holidays.  She will also eat greens in moderation. Laura HailGinna [name] [name], Pharm.D., CPP 02/24/2014@12 :08 PM

## 2014-03-17 ENCOUNTER — Other Ambulatory Visit: Payer: Self-pay

## 2014-03-17 DIAGNOSIS — Z1231 Encounter for screening mammogram for malignant neoplasm of breast: Secondary | ICD-10-CM

## 2014-03-18 ENCOUNTER — Ambulatory Visit: Payer: BC Managed Care – PPO

## 2014-03-18 ENCOUNTER — Other Ambulatory Visit (HOSPITAL_BASED_OUTPATIENT_CLINIC_OR_DEPARTMENT_OTHER): Payer: BC Managed Care – PPO

## 2014-03-18 ENCOUNTER — Ambulatory Visit (HOSPITAL_BASED_OUTPATIENT_CLINIC_OR_DEPARTMENT_OTHER): Payer: Self-pay | Admitting: Pharmacist

## 2014-03-18 DIAGNOSIS — I2699 Other pulmonary embolism without acute cor pulmonale: Secondary | ICD-10-CM

## 2014-03-18 DIAGNOSIS — D689 Coagulation defect, unspecified: Secondary | ICD-10-CM

## 2014-03-18 LAB — PROTIME-INR
INR: 2.2 (ref 2.00–3.50)
Protime: 26.4 Seconds — ABNORMAL HIGH (ref 10.6–13.4)

## 2014-03-18 LAB — POCT INR: INR: 2.2

## 2014-03-18 NOTE — Progress Notes (Signed)
INR at goal Pt is doing well with no complaints No missed or extra doses No diet changes No unusual bleeding or bruising Pt states she believes her INR was fluctuating previously due to increased tylenol intake (she was taking 1000 mg twice daily and now she has decreased this to just as needed) No other changes to report Ms. Hittle states she will be calling to make appointment with provider Plan: Continue 7mg  daily.   Recheck INR 05/19/14; lab at 11:30am and coumadin clinic at 11:45am.

## 2014-03-18 NOTE — Patient Instructions (Signed)
INR at goal No changes Continue 7mg  daily.   Recheck INR 05/19/14; lab at 11:30am and coumadin clinic at 11:45am.

## 2014-04-04 ENCOUNTER — Encounter: Payer: Self-pay | Admitting: Pharmacist

## 2014-04-04 NOTE — Progress Notes (Signed)
Rec'vd refill authorization request from [facility] on [address] stating they are using new generic warfarin & needed authorization to switch generics. Signed authorization to switch & [facility]. Ebony HailGinna Jaxxen Voong, Pharm.D., CPP 04/04/2014@10 :05 AM

## 2014-04-17 ENCOUNTER — Encounter (INDEPENDENT_AMBULATORY_CARE_PROVIDER_SITE_OTHER): Payer: Self-pay

## 2014-04-17 ENCOUNTER — Encounter: Payer: Self-pay | Admitting: Family Medicine

## 2014-04-17 ENCOUNTER — Ambulatory Visit (INDEPENDENT_AMBULATORY_CARE_PROVIDER_SITE_OTHER): Payer: BC Managed Care – PPO | Admitting: Family Medicine

## 2014-04-17 VITALS — BP 162/99 | HR 80 | Ht 67.0 in | Wt 260.0 lb

## 2014-04-17 DIAGNOSIS — M25561 Pain in right knee: Secondary | ICD-10-CM | POA: Diagnosis not present

## 2014-04-17 DIAGNOSIS — M25562 Pain in left knee: Secondary | ICD-10-CM

## 2014-04-17 MED ORDER — METHYLPREDNISOLONE ACETATE 40 MG/ML IJ SUSP
40.0000 mg | Freq: Once | INTRAMUSCULAR | Status: AC
  Administered 2014-04-17: 40 mg via INTRA_ARTICULAR

## 2014-04-17 MED ORDER — METHYLPREDNISOLONE ACETATE 40 MG/ML IJ SUSP
40.0000 mg | Freq: Once | INTRAMUSCULAR | Status: AC
Start: 2014-04-17 — End: 2014-04-17
  Administered 2014-04-17: 40 mg via INTRA_ARTICULAR

## 2014-04-18 NOTE — Progress Notes (Signed)
Patient ID: Laura [name], female   DOB: 1955-08-27, 59 y.o.   MRN: [id number]  Subjective:    Patient ID: Laura [name], female    DOB: June 22, 1955, 59 y.o.   MRN: [id number]  PCP: Dr. Dema Severin  Knee Pain   59 yo F here for bilateral knee pain  02/29/12: Patient has known history of mod-severe DJD of bilateral knees. Last cortisone injections 3 months ago - started wearing off recently and going on a trip to [place] Has h/o PEs, on anticoagulation with coumadin - last INR in past week was 2.0. Pain currently worse in right knee than left - more lateral than medial but worse medially on left knee.  09/10/13: Patient returns today for bilateral knee injections. Tried synvisc twice since last visit here. First set helped for 3 months - second one did not help. At least a few months relief with cortisone shots in past. No new injuries.  01/15/14: Patient reports her knees started bothering her a couple weeks ago. Did well with cortisone shots. Taking tylenol.  04/17/14: Patient returns for bilateral knee injections for DJD. Did well since last visit and just recently they started bothering her again. Left knee 6/10 pain, right 4/10. No catching, locking, giving out.  Past Medical History  Diagnosis Date  . High blood pressure   . History of blood clots   . 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 carvedilol (COREG) 3.125 MG tablet Take 3.125 mg by mouth 2 (two) times daily with a meal.    . Cholecalciferol 5000 UNITS capsule Take 5,000 Units by mouth daily.    . hydrochlorothiazide (HYDRODIURIL) 25 MG tablet Take 25 mg by mouth daily.    Marland Kitchen warfarin (COUMADIN) 3 MG tablet Take 2 tablets (6 mg) by mouth daily 60 tablet 1   No current facility-administered medications on file prior to visit.    Past Surgical History  Procedure  Laterality Date  . Cesarean section      1993 and 1997    No Known Allergies  History   Social History  . Marital Status: Single    Spouse Name: N/A    Number of Children: 2  . 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 Smoking status: Never Smoker   . Smokeless tobacco: Never Used  . Alcohol Use: No  . Drug Use: No  . Sexual Activity: Not on file   Other Topics Concern  . Not on file   Social History Narrative    Family History  Problem Relation Age of Onset  . Multiple myeloma Mother 33    deceased  . Hypertension Mother   . Prostate cancer Father   . Heart attack Neg Hx   . Hyperlipidemia Neg Hx   . Diabetes Neg Hx   . Sudden death Neg Hx     BP 162/99 mmHg  Pulse 80  Ht _0  (1.702 m)  Wt 260 lb (117.935 kg)  BMI 40.71 kg/m2  Review of Systems See HPI above.    Objective:   Physical Exam Gen: NAD  R knee: No effusion, ecchymoses, other deformity.  2+ crepitation. TTP medial joint line.  No lateral joint line, post patellar facet TTP. ROM 0 - 110 degrees. Negative ant/post drawers. Negative valgus/varus testing.  Negative lachmanns. Negative mcmurrays, apleys, patellar apprehension. NV intact distally.  L knee: No effusion, ecchymoses, other deformity.  2+ crepitation. TTP medial joint line.  No lateral joint line, post patellar facet TTP. ROM 0 - 110 degrees. Negative ant/post drawers. Negative valgus/varus testing. Negative lachmanns. Negative mcmurrays, apleys, patellar apprehension. NV intact distally.    Assessment & Plan:  1. Bilateral knee pain - 2/2 DJD.  Went ahead with repeated cortisone injections today.  F/u in 3 months if needed for repeat injections.  After informed written consent, patient was seated on exam table. Right knee was prepped with alcohol swab and utilizing anterolateral approach, patient's right knee was injected intraarticularly with 3:1 marcaine: depomedrol. Patient  tolerated the procedure well without immediate complications.  After informed written consent, patient was seated on exam table. Left knee was prepped with alcohol swab and utilizing anterolateral approach, patient's left knee was injected intraarticularly with 3:1 marcaine: depomedrol. Patient tolerated the procedure well without immediate complications.

## 2014-04-18 NOTE — Assessment & Plan Note (Signed)
2/2 DJD.  Went ahead with repeated cortisone injections today.  F/u in 3 months if needed for repeat injections.  After informed written consent, patient was seated on exam table. Right knee was prepped with alcohol swab and utilizing anterolateral approach, patient's right knee was injected intraarticularly with 3:1 marcaine: depomedrol. Patient tolerated the procedure well without immediate complications.  After informed written consent, patient was seated on exam table. Left knee was prepped with alcohol swab and utilizing anterolateral approach, patient's left knee was injected intraarticularly with 3:1 marcaine: depomedrol. Patient tolerated the procedure well without immediate complications.

## 2014-05-08 ENCOUNTER — Other Ambulatory Visit: Payer: Self-pay | Admitting: Oncology

## 2014-05-08 ENCOUNTER — Encounter: Payer: Self-pay | Admitting: Pharmacist

## 2014-05-08 DIAGNOSIS — I2699 Other pulmonary embolism without acute cor pulmonale: Secondary | ICD-10-CM

## 2014-05-08 DIAGNOSIS — Z7901 Long term (current) use of anticoagulants: Secondary | ICD-10-CM

## 2014-05-08 NOTE — Progress Notes (Signed)
Sent inbox to Dr. Reece AgarGSherron Monday: Spoke with Lawson FiscalLori.  She has appmt with you on 05/13/14.  Please draw PT/INR on that date.  Told her you would discuss the bridging around colonoscopy then.  She stated she was planning to r/s that appmt.  Advise on your bridge plans so we have it documented for whenever it is r/s.  She has CC appmt on 3/7, doubt she will need to keep it with her seeing you on 3/1.  Let us know if she will continue to be followed by your office or our clinic as you mentioned in your inbox message.   Thanks! Ike BeneKolleen

## 2014-05-09 ENCOUNTER — Other Ambulatory Visit: Payer: Self-pay | Admitting: Pharmacist

## 2014-05-09 ENCOUNTER — Other Ambulatory Visit: Payer: Self-pay

## 2014-05-09 DIAGNOSIS — Z7901 Long term (current) use of anticoagulants: Secondary | ICD-10-CM

## 2014-05-09 MED ORDER — WARFARIN SODIUM 3 MG PO TABS: ORAL_TABLET | ORAL | Status: DC

## 2014-05-09 MED ORDER — WARFARIN SODIUM 4 MG PO TABS
ORAL_TABLET | ORAL | Status: DC
Start: 2014-05-09 — End: 2014-05-13

## 2014-05-09 MED ORDER — WARFARIN SODIUM 4 MG PO TABS: ORAL_TABLET | ORAL | Status: DC

## 2014-05-12 ENCOUNTER — Telehealth: Payer: Self-pay | Admitting: Oncology

## 2014-05-12 NOTE — Telephone Encounter (Signed)
Call to patient to confirm appointment for 05/13/14 at 11:15. lmtcb

## 2014-05-13 ENCOUNTER — Ambulatory Visit (INDEPENDENT_AMBULATORY_CARE_PROVIDER_SITE_OTHER): Payer: BC Managed Care – PPO | Admitting: Oncology

## 2014-05-13 ENCOUNTER — Encounter: Payer: Self-pay | Admitting: Oncology

## 2014-05-13 VITALS — BP 127/85 | HR 81 | Temp 98.2°F | Ht 67.0 in | Wt 280.9 lb

## 2014-05-13 DIAGNOSIS — D689 Coagulation defect, unspecified: Secondary | ICD-10-CM | POA: Diagnosis not present

## 2014-05-13 DIAGNOSIS — M1990 Unspecified osteoarthritis, unspecified site: Secondary | ICD-10-CM

## 2014-05-13 DIAGNOSIS — Z86711 Personal history of pulmonary embolism: Secondary | ICD-10-CM

## 2014-05-13 DIAGNOSIS — Z7901 Long term (current) use of anticoagulants: Secondary | ICD-10-CM | POA: Diagnosis not present

## 2014-05-13 DIAGNOSIS — D649 Anemia, unspecified: Secondary | ICD-10-CM

## 2014-05-13 DIAGNOSIS — I2699 Other pulmonary embolism without acute cor pulmonale: Secondary | ICD-10-CM

## 2014-05-13 LAB — COMPREHENSIVE METABOLIC PANEL
ALBUMIN: 3.8 g/dL (ref 3.5–5.2)
ALK PHOS: 72 U/L (ref 39–117)
ALT: 13 U/L (ref 0–35)
AST: 17 U/L (ref 0–37)
BUN: 18 mg/dL (ref 6–23)
CO2: 28 mEq/L (ref 19–32)
Calcium: 9.1 mg/dL (ref 8.4–10.5)
Chloride: 102 mEq/L (ref 96–112)
Creat: 0.83 mg/dL (ref 0.50–1.10)
Glucose, Bld: 82 mg/dL (ref 70–99)
POTASSIUM: 4.3 meq/L (ref 3.5–5.3)
SODIUM: 140 meq/L (ref 135–145)
TOTAL PROTEIN: 7.5 g/dL (ref 6.0–8.3)
Total Bilirubin: 0.4 mg/dL (ref 0.2–1.2)

## 2014-05-13 LAB — CBC WITH DIFFERENTIAL/PLATELET
Basophils Absolute: 0 10*3/uL (ref 0.0–0.1)
Basophils Relative: 0 % (ref 0–1)
EOS PCT: 3 % (ref 0–5)
Eosinophils Absolute: 0.2 10*3/uL (ref 0.0–0.7)
HCT: 40.2 % (ref 36.0–46.0)
HEMOGLOBIN: 12.8 g/dL (ref 12.0–15.0)
LYMPHS ABS: 2.4 10*3/uL (ref 0.7–4.0)
LYMPHS PCT: 32 % (ref 12–46)
MCH: 28.6 pg (ref 26.0–34.0)
MCHC: 31.8 g/dL (ref 30.0–36.0)
MCV: 89.9 fL (ref 78.0–100.0)
MPV: 8.1 fL — ABNORMAL LOW (ref 8.6–12.4)
Monocytes Absolute: 0.7 10*3/uL (ref 0.1–1.0)
Monocytes Relative: 9 % (ref 3–12)
Neutro Abs: 4.2 10*3/uL (ref 1.7–7.7)
Neutrophils Relative %: 56 % (ref 43–77)
PLATELETS: 319 10*3/uL (ref 150–400)
RBC: 4.47 MIL/uL (ref 3.87–5.11)
RDW: 14.5 % (ref 11.5–15.5)
WBC: 7.5 10*3/uL (ref 4.0–10.5)

## 2014-05-13 MED ORDER — RIVAROXABAN 20 MG PO TABS: 20.0000 mg | ORAL_TABLET | Freq: Every day | ORAL | Status: DC

## 2014-05-13 NOTE — Progress Notes (Signed)
Patient ID: Laura [name], female   DOB: 10/07/1955, 59 y.o.   MRN: [id number] Hematology and Oncology Follow Up Visit  Laura [name] [id number] October 11, 1955 59 y.o. 05/13/2014 11:58 AM   Principle Diagnosis: Encounter Diagnoses  Name Primary?  . Coagulopathy Yes  . Pulmonary embolus    clinical summary: Pleasant 59 year old [employer]. 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 First event in November 2005. Recurrence in October 2007. She was first evaluated by me  in July 2008. 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 She has been on full dose Coumadin anticoagulation since that time with no subsequent thrombotic events. Other than the hypercoagulation testing, additional testing showed Negative ANA, hepatitis C antibody negative, protein electrophoresis with a mild polyclonal elevation of both IgG and IgA at 2120 mg percent and 441 mg percent respectively. She had a mild normochromic anemia with borderline decrease ferritin of 13. She was put on iron and her hemoglobin started to come up.Marland Kitchen She was advised to get a colonoscopy.  Interim History:  I have not seen her in 2 years. Last is it March 2014. She has had no interim medical problems. She has put off her colonoscopy but now has this scheduled for May of this year. She denies any chest pain, dyspnea, chest pressure, palpitations, leg pain or swelling. She still has arthritis pain in her knees. She gets short of breath if she walks up a hill likely due to her weight which she admits. She continues to practice psychology at [employer]    Medications: reviewed  Allergies: No Known Allergies  Review of Systems: See history of present illness  Remaining ROS negative:   Physical Exam: Blood pressure 127/85, pulse 81, temperature 98.2 F (36.8 C), temperature source Oral, height  (1.702 [name]), weight 280 lb 14.4 oz (127.415 kg), SpO2 97 %. Wt Readings from Last 3 Encounters:  05/13/14 280 lb 14.4  oz (127.415 kg)  04/17/14 260 lb (117.935 kg)  01/15/14 250 lb (113.399 kg)     General appearance: Overweight African-American woman HENNT: Pharynx no erythema, exudate, mass, or ulcer. No thyromegaly or thyroid nodules Lymph nodes: No cervical, supraclavicular, or axillary lymphadenopathy Breasts: Lungs: Clear to auscultation, resonant to percussion throughout Heart: Regular rhythm, no murmur, no gallop, no rub, no click, no edema Abdomen: Soft, obese, nontender, normal bowel sounds, no mass, no organomegaly Extremities: No edema, no calf tenderness Musculoskeletal: no joint deformities GU:  Vascular: Carotid pulses 2+, no bruits,  Neurologic: Alert, oriented, PERRLA, optic discs sharp and vessels normal left eye, not well visualized on the right, no hemorrhage or exudate, cranial nerves grossly normal, motor strength 5 over 5, reflexes 1+ symmetric at the biceps, absent symmetric at the knees,, upper body coordination normal, gait normal, Skin: No rash or ecchymosis  Lab Results: CBC W/Diff    Component Value Date/Time   WBC 8.7 05/29/2012 1502   WBC 8.9 03/04/2007 2250   RBC 4.43 05/29/2012 1502   RBC 3.89 03/04/2007 2250   HGB 12.8 05/29/2012 1502   HGB 13.3 03/04/2007 2336   HCT 40.0 05/29/2012 1502   HCT 39.0 03/04/2007 2336   PLT 265 05/29/2012 1502   PLT 381 03/04/2007 2250   MCV 90.3 05/29/2012 1502   MCV 87.1 03/04/2007 2250   MCH 28.9 05/29/2012 1502   MCHC 32.0 05/29/2012 1502   MCHC 32.4 03/04/2007 2250   RDW 14.9* 05/29/2012 1502   RDW 15.0 03/04/2007 2250   LYMPHSABS 2.9 05/29/2012 1502  LYMPHSABS 2.3 03/04/2007 2250   MONOABS 0.7 05/29/2012 1502   MONOABS 0.8 03/04/2007 2250   EOSABS 0.3 05/29/2012 1502   EOSABS 0.4 03/04/2007 2250   BASOSABS 0.0 05/29/2012 1502   BASOSABS 0.0 03/04/2007 2250     Chemistry      Component Value Date/Time   NA 137 03/04/2007 2336   K 3.9 03/04/2007 2336   CL 106 03/04/2007 2336   BUN 14 03/04/2007 2336    CREATININE 0.9 03/04/2007 2336      Component Value Date/Time   ALKPHOS 73 02/18/2008 1458   AST 15 02/18/2008 1458   ALT 10 02/18/2008 1458   BILITOT 0.4 02/18/2008 1458       Impression:  #1. 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 History of recurrent pulmonary emboli secondary to #1 She will continue chronic anticoagulation. We discussed transitioning off of warfarin and going on one of the new target specific oral anticoagulants. International guidelines chest updated in December 2015 in the journal Chest and these drugs are now being recommended in preference to warfarin for people who need to be on long-term anticoagulation due to their efficacy and overall safety. She will stop the warfarin. Wait for 48 hours then go on Xarelto 20 mg daily without a loading dose.  In anticipation of colonoscopy, she is advised to stop the Xarelto for 24 hours prior to the procedure and resume it on the night of the procedure. I'[name] going to check a baseline CBC and chemistry profile today and then again in 6 months with a follow-up clinical visit to make sure she is having no problems with the Xarelto. I gave her an education packet on the drug.  #3. Chronic, mild, normochromic anemia Most recent Hemoglobin stable at 12.8 g MCV 90 05/29/2012. Myeloma screen negative. Repeat lab today pending.  #4. Degenerative arthritis.   CC: Patient Care Team: Laura Bradfordynthia S White, MD as PCP - General (Family Medicine)   Levert FeinsteinGRANFORTUNA,Laura Beckner M, MD 3/1/201611:58 AM

## 2014-05-13 NOTE — Patient Instructions (Signed)
To lab today Stop coumadin X 2 days then Start Xarelto 20 mg 1 daily Stop Xarelto for 24 hours before colonoscopy; resume night of procedure Return visit with Dr Reece AgarG in 6 months; lab same day

## 2014-05-14 ENCOUNTER — Telehealth: Payer: Self-pay | Admitting: *Deleted

## 2014-05-14 NOTE — Telephone Encounter (Signed)
Pt called/informed labs are good per Dr Cyndie ChimeGranfortuna. And she can view them via My Chart which she does have.

## 2014-05-14 NOTE — Telephone Encounter (Signed)
-----   Message from Levert FeinsteinJames M Granfortuna, MD sent at 05/14/2014  2:22 PM EST ----- Call pt: labs good - she can view on my chart

## 2014-05-19 ENCOUNTER — Other Ambulatory Visit: Payer: BC Managed Care – PPO

## 2014-05-19 ENCOUNTER — Ambulatory Visit: Payer: BC Managed Care – PPO

## 2014-06-23 ENCOUNTER — Telehealth: Payer: Self-pay | Admitting: Family Medicine

## 2014-06-23 NOTE — Telephone Encounter (Signed)
Ok to get approval for Con Memossupartz series for her

## 2014-06-30 ENCOUNTER — Encounter (INDEPENDENT_AMBULATORY_CARE_PROVIDER_SITE_OTHER): Payer: Self-pay

## 2014-06-30 ENCOUNTER — Encounter: Payer: Self-pay | Admitting: Family Medicine

## 2014-06-30 ENCOUNTER — Ambulatory Visit (INDEPENDENT_AMBULATORY_CARE_PROVIDER_SITE_OTHER): Payer: BC Managed Care – PPO | Admitting: Family Medicine

## 2014-06-30 VITALS — BP 122/81 | HR 87 | Ht 66.0 in | Wt 275.0 lb

## 2014-06-30 DIAGNOSIS — M25562 Pain in left knee: Secondary | ICD-10-CM | POA: Diagnosis not present

## 2014-06-30 DIAGNOSIS — M25561 Pain in right knee: Secondary | ICD-10-CM | POA: Diagnosis not present

## 2014-06-30 DIAGNOSIS — M179 Osteoarthritis of knee, unspecified: Secondary | ICD-10-CM | POA: Diagnosis not present

## 2014-06-30 DIAGNOSIS — M1710 Unilateral primary osteoarthritis, unspecified knee: Secondary | ICD-10-CM

## 2014-06-30 DIAGNOSIS — IMO0002 Reserved for concepts with insufficient information to code with codable children: Secondary | ICD-10-CM

## 2014-06-30 MED ORDER — METHYLPREDNISOLONE ACETATE 40 MG/ML IJ SUSP
40.0000 mg | Freq: Once | INTRAMUSCULAR | Status: AC
  Administered 2014-06-30: 40 mg via INTRA_ARTICULAR

## 2014-07-01 NOTE — Assessment & Plan Note (Signed)
2/2 DJD.  Went ahead with repeated cortisone injections today.  Will work on viscosupplementation approval and start this if cortisone not lasting 3 months which it seems to only be lasting 2 at this point.  After informed written consent, patient was seated on exam table. Right knee was prepped with alcohol swab and utilizing anterolateral approach, patient's right knee was injected intraarticularly with 3:1 marcaine: depomedrol. Patient tolerated the procedure well without immediate complications.  After informed written consent, patient was seated on exam table. Left knee was prepped with alcohol swab and utilizing anterolateral approach, patient's left knee was injected intraarticularly with 3:1 marcaine: depomedrol. Patient tolerated the procedure well without immediate complications.

## 2014-07-01 NOTE — Progress Notes (Signed)
Patient ID: Laura [name], female   DOB: [birth year], 59 y.o.   MRN: [id number]  Subjective:   PCP: Dr. Dema Severin  Knee Pain   59 yo F here for bilateral knee pain  02/29/12: Patient has known history of mod-severe DJD of bilateral knees. Last cortisone injections 3 months ago - started wearing off recently and going on a trip to [place] Has h/o PEs, on anticoagulation with coumadin - last INR in past week was 2.0. Pain currently worse in right knee than left - more lateral than medial but worse medially on left knee.  09/10/13: Patient returns today for bilateral knee injections. Tried synvisc twice since last visit here. First set helped for 3 months - second one did not help. At least a few months relief with cortisone shots in past. No new injuries.  01/15/14: Patient reports her knees started bothering her a couple weeks ago. Did well with cortisone shots. Taking tylenol.  04/17/14: Patient returns for bilateral knee injections for DJD. Did well since last visit and just recently they started bothering her again. Left knee 6/10 pain, right 4/10. No catching, locking, giving out.  4/18: Patient returns for repeat injections - both knees 3/10 level. She's also interested in viscosupplementation - working on getting approval Jacklyn Shell, our usual, is not approved by her insurance).  Past Medical History  Diagnosis Date  . High blood pressure   . History of blood clots   . 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 carvedilol (COREG) 3.125 MG tablet Take 3.125 mg by mouth 2 (two) times daily with a meal.    . Cholecalciferol 5000 UNITS capsule Take 5,000 Units by mouth daily.    . hydrochlorothiazide (HYDRODIURIL) 25 MG tablet Take 25 mg by mouth daily.    . rivaroxaban (XARELTO) 20 MG TABS tablet Take 1 tablet (20 mg total) by mouth  daily with supper. 30 tablet 11   No current facility-administered medications on file prior to visit.    Past Surgical History  Procedure Laterality Date  . Cesarean section      1993 and 1997    No Known Allergies  History   Social History  . Marital Status: Single    Spouse Name: N/A  . Number of Children: 2  . 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 Smoking status: Never Smoker   . Smokeless tobacco: Never Used  . Alcohol Use: 0.0 oz/week    0 Standard drinks or equivalent per week     Comment: Rarely.  . Drug Use: No  . Sexual Activity: Not on file   Other Topics Concern  . Not on file   Social History Narrative    Family History  Problem Relation Age of Onset  . Multiple myeloma Mother 99    deceased  . Hypertension Mother   . Prostate cancer Father   . Heart attack Neg Hx   . Hyperlipidemia Neg Hx   . Diabetes Neg Hx   . Sudden death Neg Hx     BP 122/81 mmHg  Pulse 87  Ht _0  (1.676 m)  Wt 275 lb (124.739 kg)  BMI 44.41 kg/m2  Review of Systems See HPI above.    Objective:   Physical Exam Gen: NAD Exam not  repeated today R knee: No effusion, ecchymoses, other deformity.  2+ crepitation. TTP medial joint line.  No lateral joint line, post patellar facet TTP. ROM 0 - 110 degrees. Negative ant/post drawers. Negative valgus/varus testing. Negative lachmanns. Negative mcmurrays, apleys, patellar apprehension. NV intact distally.  L knee: No effusion, ecchymoses, other deformity.  2+ crepitation. TTP medial joint line.  No lateral joint line, post patellar facet TTP. ROM 0 - 110 degrees. Negative ant/post drawers. Negative valgus/varus testing. Negative lachmanns. Negative mcmurrays, apleys, patellar apprehension. NV intact distally.    Assessment & Plan:  1. Bilateral knee pain - 2/2 DJD.  Went ahead with repeated cortisone injections today.  Will work on viscosupplementation approval  and start this if cortisone not lasting 3 months which it seems to only be lasting 2 at this point.  After informed written consent, patient was seated on exam table. Right knee was prepped with alcohol swab and utilizing anterolateral approach, patient's right knee was injected intraarticularly with 3:1 marcaine: depomedrol. Patient tolerated the procedure well without immediate complications.  After informed written consent, patient was seated on exam table. Left knee was prepped with alcohol swab and utilizing anterolateral approach, patient's left knee was injected intraarticularly with 3:1 marcaine: depomedrol. Patient tolerated the procedure well without immediate complications.

## 2014-08-05 ENCOUNTER — Telehealth: Payer: Self-pay | Admitting: Family Medicine

## 2014-08-05 NOTE — Telephone Encounter (Signed)
Spoke with patient and told her that as soon as I hear back from insurance company I will contact her.

## 2014-08-20 ENCOUNTER — Encounter: Payer: Self-pay | Admitting: Family Medicine

## 2014-08-20 ENCOUNTER — Ambulatory Visit (INDEPENDENT_AMBULATORY_CARE_PROVIDER_SITE_OTHER): Payer: BC Managed Care – PPO | Admitting: Family Medicine

## 2014-08-20 VITALS — BP 139/87 | HR 81 | Ht 66.0 in | Wt 275.0 lb

## 2014-08-20 DIAGNOSIS — M7661 Achilles tendinitis, right leg: Secondary | ICD-10-CM

## 2014-08-20 MED ORDER — DICLOFENAC SODIUM 1 % TD GEL: 2.0000 g | Freq: Four times a day (QID) | TRANSDERMAL | Status: DC

## 2014-08-20 NOTE — Patient Instructions (Signed)
You have Achilles Tendinopathy Tylenol and/or voltaren gel for pain. Calf raises 3 sets of 10 on level ground once a day first. When these are easy, can do them one legged 3 sets of 10. Finally advance to doing them on a step. Can add heel walks, toe walks forward and backward as well Ice bucket 10-15 minutes at end of day - can ice 3-4 times a day. Stretching - hold for 20-30 seconds, repeat each one 3 times. Avoid uneven ground, hills as much as possible. Heel lifts in shoes or shoes with a natural heel lift. Consider physical therapy, orthotics, nitro patches if not improving as expected. Follow up in 6 weeks.

## 2014-08-21 DIAGNOSIS — M7661 Achilles tendinitis, right leg: Secondary | ICD-10-CM | POA: Insufficient documentation

## 2014-08-21 NOTE — Assessment & Plan Note (Signed)
Start with home exercise program.  Did well with voltaren gel in past and was cleared by her cardiologist (not cleared for oral NSAIDs as she's on xarelto).  Icing, heel lifts.  Avoid uneven ground, flat shoes.  Consider PT, orthotics, nitro patches if not improving.  F/u in 6 weeks.

## 2014-08-21 NOTE — Progress Notes (Signed)
PCP: Vidal Schwalbe, MD  Subjective:   HPI: Patient is a 59 y.o. female here for right heel pain.  Patient reports over past few months she's had worsening right heel pain. History of partial tear of this achilles several years ago. Pain up to 6/10 with walking. Feels stiff in the morning. Not tried to do anything for this yet.  Past Medical History  Diagnosis Date  . High blood pressure   . History of blood clots   . 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 carvedilol (COREG) 3.125 MG tablet Take 3.125 mg by mouth 2 (two) times daily with a meal.    . Cholecalciferol 5000 UNITS capsule Take 5,000 Units by mouth daily.    . hydrochlorothiazide (HYDRODIURIL) 25 MG tablet Take 25 mg by mouth daily.    . rivaroxaban (XARELTO) 20 MG TABS tablet Take 1 tablet (20 mg total) by mouth daily with supper. 30 tablet 11   No current facility-administered medications on file prior to visit.    Past Surgical History  Procedure Laterality Date  . Cesarean section      1993 and 1997    No Known Allergies  History   Social History  . Marital Status: Single    Spouse Name: N/A  . Number of Children: 2  . 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 Smoking status: Never Smoker   . Smokeless tobacco: Never Used  . Alcohol Use: 0.0 oz/week    0 Standard drinks or equivalent per week     Comment: Rarely.  . Drug Use: No  . Sexual Activity: Not on file   Other Topics Concern  . Not on file   Social History Narrative    Family History  Problem Relation Age of Onset  . Multiple myeloma Mother 18    deceased  . Hypertension Mother   . Prostate cancer Father   . Heart attack Neg Hx   . Hyperlipidemia Neg Hx   . Diabetes Neg Hx   . Sudden death Neg Hx     BP 139/87 mmHg  Pulse 81  Ht 5' 6"   (1.676 m)  Wt 275 lb (124.739 kg)  BMI 44.41 kg/m2  Review of Systems: See HPI above.    Objective:  Physical Exam:  Gen: NAD  Right ankle/foot: Diffuse lower leg swelling.  No bruising, other deformity. FROM with mild pain on calf raise, plantarflexion. TTP achilles at insertion and just proximal to this.  No other tenderness. Negative ant drawer and talar tilt.   Negative syndesmotic compression. Thompsons test negative. NV intact distally.    Assessment & Plan:  1. Right achilles tendinopathy - Start with home exercise program.  Did well with voltaren gel in past and was cleared by her cardiologist (not cleared for oral NSAIDs as she's on xarelto).  Icing, heel lifts.  Avoid uneven ground, flat shoes.  Consider PT, orthotics, nitro patches if not improving.  F/u in 6 weeks.

## 2014-09-12 ENCOUNTER — Ambulatory Visit: Payer: BC Managed Care – PPO | Admitting: Podiatry

## 2014-09-24 ENCOUNTER — Ambulatory Visit (INDEPENDENT_AMBULATORY_CARE_PROVIDER_SITE_OTHER): Payer: BC Managed Care – PPO

## 2014-09-24 ENCOUNTER — Encounter: Payer: Self-pay | Admitting: Podiatry

## 2014-09-24 ENCOUNTER — Ambulatory Visit (INDEPENDENT_AMBULATORY_CARE_PROVIDER_SITE_OTHER): Payer: BC Managed Care – PPO | Admitting: Podiatry

## 2014-09-24 VITALS — BP 168/72 | HR 88 | Resp 12

## 2014-09-24 DIAGNOSIS — M2010 Hallux valgus (acquired), unspecified foot: Secondary | ICD-10-CM

## 2014-09-24 DIAGNOSIS — M722 Plantar fascial fibromatosis: Secondary | ICD-10-CM | POA: Diagnosis not present

## 2014-09-24 NOTE — Patient Instructions (Signed)
Plantar Fasciitis (Heel Spur Syndrome) with Rehab The plantar fascia is a fibrous, ligament-like, soft-tissue structure that spans the bottom of the foot. Plantar fasciitis is a condition that causes pain in the foot due to inflammation of the tissue. SYMPTOMS   Pain and tenderness on the underneath side of the foot.  Pain that worsens with standing or walking. CAUSES  Plantar fasciitis is caused by irritation and injury to the plantar fascia on the underneath side of the foot. Common mechanisms of injury include:  Direct trauma to bottom of the foot.  Damage to a small nerve that runs under the foot where the main fascia attaches to the heel bone.  Stress placed on the plantar fascia due to bone spurs. RISK INCREASES WITH:   Activities that place stress on the plantar fascia (running, jumping, pivoting, or cutting).  Poor strength and flexibility.  Improperly fitted shoes.  Tight calf muscles.  Flat feet.  Failure to warm-up properly before activity.  Obesity. PREVENTION  Warm up and stretch properly before activity.  Allow for adequate recovery between workouts.  Maintain physical fitness:  Strength, flexibility, and endurance.  Cardiovascular fitness.  Maintain a health body weight.  Avoid stress on the plantar fascia.  Wear properly fitted shoes, including arch supports for individuals who have flat feet. PROGNOSIS  If treated properly, then the symptoms of plantar fasciitis usually resolve without surgery. However, occasionally surgery is necessary. RELATED COMPLICATIONS   Recurrent symptoms that may result in a chronic condition.  Problems of the lower back that are caused by compensating for the injury, such as limping.  Pain or weakness of the foot during push-off following surgery.  Chronic inflammation, scarring, and partial or complete fascia tear, occurring more often from repeated injections. TREATMENT  Treatment initially involves the use of  ice and medication to help reduce pain and inflammation. The use of strengthening and stretching exercises may help reduce pain with activity, especially stretches of the Achilles tendon. These exercises may be performed at home or with a therapist. Your caregiver may recommend that you use heel cups of arch supports to help reduce stress on the plantar fascia. Occasionally, corticosteroid injections are given to reduce inflammation. If symptoms persist for greater than 6 months despite non-surgical (conservative), then surgery may be recommended.  MEDICATION   If pain medication is necessary, then nonsteroidal anti-inflammatory medications, such as aspirin and ibuprofen, or other minor pain relievers, such as acetaminophen, are often recommended.  Do not take pain medication within 7 days before surgery.  Prescription pain relievers may be given if deemed necessary by your caregiver. Use only as directed and only as much as you need.  Corticosteroid injections may be given by your caregiver. These injections should be reserved for the most serious cases, because they may only be given a certain number of times. HEAT AND COLD  Cold treatment (icing) relieves pain and reduces inflammation. Cold treatment should be applied for 10 to 15 minutes every 2 to 3 hours for inflammation and pain and immediately after any activity that aggravates your symptoms. Use ice packs or massage the area with a piece of ice (ice massage).  Heat treatment may be used prior to performing the stretching and strengthening activities prescribed by your caregiver, physical therapist, or athletic trainer. Use a heat pack or soak the injury in warm water. SEEK IMMEDIATE MEDICAL CARE IF:  Treatment seems to offer no benefit, or the condition worsens.  Any medications produce adverse side effects. EXERCISES RANGE   OF MOTION (ROM) AND STRETCHING EXERCISES - Plantar Fasciitis (Heel Spur Syndrome) These exercises may help you  when beginning to rehabilitate your injury. Your symptoms may resolve with or without further involvement from your physician, physical therapist or athletic trainer. While completing these exercises, remember:   Restoring tissue flexibility helps normal motion to return to the joints. This allows healthier, less painful movement and activity.  An effective stretch should be held for at least 30 seconds.  A stretch should never be painful. You should only feel a gentle lengthening or release in the stretched tissue. RANGE OF MOTION - Toe Extension, Flexion  Sit with your right / left leg crossed over your opposite knee.  Grasp your toes and gently pull them back toward the top of your foot. You should feel a stretch on the bottom of your toes and/or foot.  Hold this stretch for __________ seconds.  Now, gently pull your toes toward the bottom of your foot. You should feel a stretch on the top of your toes and or foot.  Hold this stretch for __________ seconds. Repeat __________ times. Complete this stretch __________ times per day.  RANGE OF MOTION - Ankle Dorsiflexion, Active Assisted  Remove shoes and sit on a chair that is preferably not on a carpeted surface.  Place right / left foot under knee. Extend your opposite leg for support.  Keeping your heel down, slide your right / left foot back toward the chair until you feel a stretch at your ankle or calf. If you do not feel a stretch, slide your bottom forward to the edge of the chair, while still keeping your heel down.  Hold this stretch for __________ seconds. Repeat __________ times. Complete this stretch __________ times per day.  STRETCH - Gastroc, Standing  Place hands on wall.  Extend right / left leg, keeping the front knee somewhat bent.  Slightly point your toes inward on your back foot.  Keeping your right / left heel on the floor and your knee straight, shift your weight toward the wall, not allowing your back to  arch.  You should feel a gentle stretch in the right / left calf. Hold this position for __________ seconds. Repeat __________ times. Complete this stretch __________ times per day. STRETCH - Soleus, Standing  Place hands on wall.  Extend right / left leg, keeping the other knee somewhat bent.  Slightly point your toes inward on your back foot.  Keep your right / left heel on the floor, bend your back knee, and slightly shift your weight over the back leg so that you feel a gentle stretch deep in your back calf.  Hold this position for __________ seconds. Repeat __________ times. Complete this stretch __________ times per day. STRETCH - Gastrocsoleus, Standing  Note: This exercise can place a lot of stress on your foot and ankle. Please complete this exercise only if specifically instructed by your caregiver.   Place the ball of your right / left foot on a step, keeping your other foot firmly on the same step.  Hold on to the wall or a rail for balance.  Slowly lift your other foot, allowing your body weight to press your heel down over the edge of the step.  You should feel a stretch in your right / left calf.  Hold this position for __________ seconds.  Repeat this exercise with a slight bend in your right / left knee. Repeat __________ times. Complete this stretch __________ times per day.    STRENGTHENING EXERCISES - Plantar Fasciitis (Heel Spur Syndrome)  These exercises may help you when beginning to rehabilitate your injury. They may resolve your symptoms with or without further involvement from your physician, physical therapist or athletic trainer. While completing these exercises, remember:   Muscles can gain both the endurance and the strength needed for everyday activities through controlled exercises.  Complete these exercises as instructed by your physician, physical therapist or athletic trainer. Progress the resistance and repetitions only as guided. STRENGTH -  Towel Curls  Sit in a chair positioned on a non-carpeted surface.  Place your foot on a towel, keeping your heel on the floor.  Pull the towel toward your heel by only curling your toes. Keep your heel on the floor.  If instructed by your physician, physical therapist or athletic trainer, add ____________________ at the end of the towel. Repeat __________ times. Complete this exercise __________ times per day. STRENGTH - Ankle Inversion  Secure one end of a rubber exercise band/tubing to a fixed object (table, pole). Loop the other end around your foot just before your toes.  Place your fists between your knees. This will focus your strengthening at your ankle.  Slowly, pull your big toe up and in, making sure the band/tubing is positioned to resist the entire motion.  Hold this position for __________ seconds.  Have your muscles resist the band/tubing as it slowly pulls your foot back to the starting position. Repeat __________ times. Complete this exercises __________ times per day.  Document Released: 02/28/2005 Document Revised: 05/23/2011 Document Reviewed: 06/12/2008 Exit[employer] Patient Information 2015 [employer]. This information is not intended to replace advice given to you by your health care provider. Make sure you discuss any questions you have with your health care provider.  

## 2014-09-24 NOTE — Progress Notes (Signed)
   Subjective:    Patient ID: Eddie NorthLori Winchel, female    DOB: [birth year], 57 y.o.   MRN: [id number]  HPI 59 year old female presents the office if wounds of bilateral heel pain on the left side worse in the right. She states that she has had pain for the last couple of months. She works in [employer], [employer] and she asked multiple doctors about what to do and she hasn't been stretching icing the area. She also saw her primary care physician who recently prescribed a Medrol Dosepak for which she finished last week. She states that she has not had much relief from these treatments. She states that she has pain to the bottom of her left heel mostly in the morning which is relieved by ambulation although it does become a throbbing pain by the end of the day after being on her feet for quite some time. She also states that she has been diagnosed with Achilles tendinitis on the right side. She states this has been improving from the stretching. Denies any history of injury or trauma denies any change or increase activities time of onset of symptoms. Denies any swelling or redness. Denies a numbness or tearing. The pain does not wake her up at night. No other complaints at this time.  Review of Systems  All other systems reviewed and are negative.      Objective:   Physical Exam AAO x3, NAD DP/PT pulses palpable bilaterally, CRT less than 3 seconds Protective sensation intact with Simms Weinstein monofilament, vibratory sensation intact, Achilles tendon reflex intact Tenderness to palpation overlying the plantar medial tubercle of the calcaneus to left heel at the insertion of the plantar fascia. There is no pain on the right at this time. There is no pain along the course of plantar fascial within the arch of the foot. There is no pain with lateral compression of the calcaneus or pain the vibratory sensation. No pain on the posterior aspect of the calcaneus or along the course/insertion of the Achilles tendon on the  left or the right at this time. There is no defect noted and Thompson test was performed and is negative. There is no overlying edema, erythema, increase in warmth. No other areas of tenderness palpation or pain with vibratory sensation to the foot/ankle. MMT 5/5, ROM WNL No open lesions or pre-ulcerative lesions are identified. No pain with calf compression, swelling, warmth, erythema.      Assessment & Plan:  59 year old female with bilateral heel pain left greater than right. -X-rays were obtained and reviewed with the patient.  -Treatment options discussed including all alternatives, risks, and complications -Patient elects to proceed with steroid injection into the left heel. Under sterile skin preparation, a total of 2.5cc of kenalog 10, 0.5% Marcaine plain, and 2% lidocaine plain were infiltrated into the symptomatic area without complication. A band-aid was applied. Patient tolerated the injection well without complication. Post-injection care with discussed with the patient. Discussed with the patient to ice the area over the next couple of days to help prevent a steroid flare.  -Dispensed plantar fascial brace. -Continue stretching activities on a consistent basis. Ice to the area daily. -Discussed shoe gear modifications and not to go barefoot even while at home. -She is purchased over-the-counter inserts and recommended her to continue with these for now. -Follow-up 3 weeks or sooner if any problems arise. In the meantime, encouraged to call the office with any questions, concerns, change in symptoms.   Ovid CurdMatthew Rafeef Lau, DPM

## 2014-10-02 ENCOUNTER — Ambulatory Visit: Payer: BC Managed Care – PPO | Admitting: Podiatry

## 2014-10-17 ENCOUNTER — Ambulatory Visit: Payer: BC Managed Care – PPO | Admitting: Podiatry

## 2014-11-21 ENCOUNTER — Encounter: Payer: Self-pay | Admitting: *Deleted

## 2014-11-21 ENCOUNTER — Ambulatory Visit: Payer: BC Managed Care – PPO | Admitting: Cardiology

## 2014-11-26 ENCOUNTER — Encounter: Payer: Self-pay | Admitting: Cardiology

## 2014-11-26 ENCOUNTER — Ambulatory Visit (INDEPENDENT_AMBULATORY_CARE_PROVIDER_SITE_OTHER): Payer: BC Managed Care – PPO | Admitting: Cardiology

## 2014-11-26 VITALS — BP 130/90 | HR 70 | Ht 66.5 in | Wt 290.0 lb

## 2014-11-26 DIAGNOSIS — R0600 Dyspnea, unspecified: Secondary | ICD-10-CM

## 2014-11-26 DIAGNOSIS — I2699 Other pulmonary embolism without acute cor pulmonale: Secondary | ICD-10-CM

## 2014-11-26 NOTE — Patient Instructions (Signed)
Medication Instructions:  Your physician recommends that you continue on your current medications as directed. Please refer to the Current Medication list given to you today.  Testing/Procedures: Your physician has requested that you have an echocardiogram. Echocardiography is a painless test that uses sound waves to create images of your heart. It provides your doctor with information about the size and shape of your heart and how well your heart's chambers and valves are working. This procedure takes approximately one hour. There are no restrictions for this procedure.  Follow-Up: Follow up as needed with Dr Anne Fu after testing.  Thank you for choosing [facility]!!

## 2014-11-26 NOTE — Progress Notes (Signed)
Cardiology Office Note   Date:  11/26/2014   ID:  Laura [name], DOB April 16, 1955, MRN [id number]  PCP:  Vidal Schwalbe, MD  Cardiologist:   Candee Furbish, MD       History of Present Illness: Laura [name] is a 59 y.o. female who presents for evaluation of exercise program. Has history of pulmonary embolism 2, lifelong anticoagulation, obesity, family history of heart failure and dyspnea on exertion.  Weight Watchers, trying the back on track after father died. Struggling dyspnea. Reducing it when going up stairs. Pulmonary embolism was over 10 years ago and she continues with Xarelto. Her father had heart failure and she is worried about this. Oxygen saturation 97%. Blood pressure is under fairly good control.  She is a Engineer, water at [employer]. Her daughter just went to college at [employer] in [place]    Past Medical History  Diagnosis Date  . High blood pressure   . History of blood clots   . 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 Edema   . Herpes   . Obesity   . Vitamin D deficiency   . Partial tear of Achilles tendon   . Pulmonary embolism     Past Surgical History  Procedure Laterality Date  . Cesarean section      1993 and 1997     Current Outpatient Prescriptions  Medication Sig Dispense Refill  . carvedilol (COREG) 3.125 MG tablet Take 3.125 mg by mouth 2 (two) times daily with a meal.    . Cholecalciferol 5000 UNITS capsule Take 5,000 Units by mouth daily.    . diclofenac sodium (VOLTAREN) 1 % GEL Apply 2 g topically 4 (four) times daily. 3 Tube 1  . hydrochlorothiazide (HYDRODIURIL) 25 MG tablet Take 25 mg by mouth daily.    . rivaroxaban (XARELTO) 20 MG TABS tablet Take 1 tablet (20 mg total) by mouth daily with supper. 30 tablet 11   No current facility-administered medications for this visit.    Allergies:   Review of patient's allergies indicates no known allergies.    Social History:  The patient  reports  that she has never smoked. She has never used smokeless tobacco. She reports that she drinks alcohol. She reports that she does not use illicit drugs.   Family History:  The patient's family history includes Heart failure in her father and mother; Hypertension in her mother; Multiple myeloma in her father; Prostate cancer in her father. There is no history of Heart attack, Hyperlipidemia, Diabetes, or Sudden death.    ROS:  Please see the history of present illness.   Otherwise, review of systems are positive for none.   All other systems are reviewed and negative.    PHYSICAL EXAM: VS:  BP 130/90 mmHg  Pulse 70  Ht 5' 6.5" (1.689 m)  Wt 290 lb (131.543 kg)  BMI 46.11 kg/m2 , BMI Body mass index is 46.11 kg/(m^2). GEN: Well nourished, well developed, in no acute distress HEENT: normal Neck: no JVD, carotid bruits, or masses Cardiac: RRR; no murmurs, rubs, or gallops,no edema  Respiratory:  clear to auscultation bilaterally, normal work of breathing GI: soft, nontender, nondistended, + BS MS: no deformity or atrophy Skin: warm and dry, no rash, chronic edema changes of lower extremities Neuro:  Strength and sensation are intact Psych: euthymic mood, full affect   EKG:  EKG is ordered today. The ekg ordered today demonstrates 11/26/14-sinus rhythm, 70, no other abnormalities,  normal intervals.   Recent Labs: 05/13/2014: ALT 13; BUN 18; Creat 0.83; Hemoglobin 12.8; Platelets 319; Potassium 4.3; Sodium 140    Lipid Panel No results found for: CHOL, TRIG, HDL, CHOLHDL, VLDL, LDLCALC, LDLDIRECT    Wt Readings from Last 3 Encounters:  11/26/14 290 lb (131.543 kg)  08/20/14 275 lb (124.739 kg)  06/30/14 275 lb (124.739 kg)      Other studies Reviewed: Additional studies/ records that were reviewed today include: Office note, lab work, EKG. Review of the above records demonstrates: As above   ASSESSMENT AND PLAN:  1.  Dyspnea  - she is eager to start exercise program, weight  loss. She has prior history of pulmonary embolism. I will check echo to ensure that she has proper structure and function. Her father died with heart failure. EKG is unremarkable. If echo is normal or low risk, I'm comfortable with her proceeding with exercise.  2. Lower extremity edema-likely dependent, influenced by obesity as well. It would not be unreasonable to transition from HCTZ to Lasix 40 mg once a day, more potent diuretic. I would not use combination of both for fear of electrolyte abnormalities.  3. Morbid obesity-continue with weight loss. Okay to start exercise. She has been struggling with surrounding her father's recent death.   Current medicines are reviewed at length with the patient today.  The patient does not have concerns regarding medicines.  The following changes have been made:  no change  Labs/ tests ordered today include:   Orders Placed This Encounter  Procedures  . EKG 12-Lead  . Echocardiogram     Disposition:   Will follow up with ECHO.   Bobby Rumpf, MD  11/26/2014 4:59 PM    [facility] [place], [place], [place]  [address] Phone: [phone]; Fax: [phone]

## 2014-12-03 ENCOUNTER — Other Ambulatory Visit (HOSPITAL_COMMUNITY): Payer: BC Managed Care – PPO

## 2014-12-05 ENCOUNTER — Other Ambulatory Visit (HOSPITAL_COMMUNITY): Payer: BC Managed Care – PPO

## 2014-12-05 ENCOUNTER — Ambulatory Visit: Payer: BC Managed Care – PPO | Admitting: Family Medicine

## 2014-12-08 ENCOUNTER — Ambulatory Visit: Payer: BC Managed Care – PPO | Admitting: Oncology

## 2014-12-08 ENCOUNTER — Encounter: Payer: Self-pay | Admitting: Oncology

## 2014-12-09 ENCOUNTER — Encounter: Payer: Self-pay | Admitting: Oncology

## 2014-12-09 NOTE — Progress Notes (Signed)
Patient ID: Laura [name], female   DOB: 1955-07-06, 59 y.o.   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 She did not keep her appointment today.

## 2014-12-10 ENCOUNTER — Telehealth: Payer: Self-pay | Admitting: *Deleted

## 2014-12-10 NOTE — Telephone Encounter (Signed)
Return pt's call - states Xarelto is causing weight gain and joint pain (she google the side effects); wants to stop Xarelto right a way and be put on something else. States she had to cancel her last appt ( 9/26 Monday) and left message to re-schedule her appt. And does not want to wait to talk about this at appt since she does not know when it will be. Telephone# [phone]

## 2014-12-12 ENCOUNTER — Ambulatory Visit (HOSPITAL_COMMUNITY): Payer: BC Managed Care – PPO | Attending: Cardiology

## 2014-12-12 ENCOUNTER — Other Ambulatory Visit: Payer: Self-pay

## 2014-12-12 DIAGNOSIS — Z8249 Family history of ischemic heart disease and other diseases of the circulatory system: Secondary | ICD-10-CM | POA: Diagnosis not present

## 2014-12-12 DIAGNOSIS — I10 Essential (primary) hypertension: Secondary | ICD-10-CM | POA: Diagnosis not present

## 2014-12-12 DIAGNOSIS — I517 Cardiomegaly: Secondary | ICD-10-CM | POA: Diagnosis not present

## 2014-12-12 DIAGNOSIS — R0600 Dyspnea, unspecified: Secondary | ICD-10-CM | POA: Diagnosis not present

## 2014-12-18 ENCOUNTER — Encounter: Payer: Self-pay | Admitting: Oncology

## 2014-12-18 ENCOUNTER — Telehealth: Payer: Self-pay | Admitting: *Deleted

## 2014-12-18 NOTE — Telephone Encounter (Addendum)
Letter faxed back to [facility] with instructions for Xarelto per Dr Cyndie Chime - "Stop Xarelto day before extractions.resume day of surgery >/- 8 hours after extractions".  Pt was called also.

## 2014-12-18 NOTE — Progress Notes (Unsigned)
Patient ID: Laura [name], female   DOB: 12-22-1955, 59 y.o.   MRN: [id number] Patient on chronic anticoagulation with Xarelto S/P recurrent PEs, congenital elevation of factor VIII. She believes Xarelto causing weight gain and joint pains. I left phone message the other day & today that we could change her to apixaban 5 mg BID. Our pharmacist researched & no musculoskeletal problems reported with this med. Waiting to hear back from patient, who, by the way, skipped her scheduled appointment here last week.

## 2014-12-18 NOTE — Telephone Encounter (Addendum)
I called pt - requesting side effects of Eliquis; concern about joint pain - told her from what I read this is a rare side effect. She decided to stay on Xarelto until her appt  Monday 10/31; then she will discuss this issue further. Also stated she's having dental work on the 18th - needs to know how to take Xarelto.

## 2014-12-29 ENCOUNTER — Ambulatory Visit (INDEPENDENT_AMBULATORY_CARE_PROVIDER_SITE_OTHER): Payer: BC Managed Care – PPO | Admitting: Family Medicine

## 2014-12-29 ENCOUNTER — Encounter: Payer: Self-pay | Admitting: Family Medicine

## 2014-12-29 VITALS — BP 157/84 | Ht 66.5 in | Wt 290.0 lb

## 2014-12-29 DIAGNOSIS — M25562 Pain in left knee: Secondary | ICD-10-CM

## 2014-12-29 DIAGNOSIS — M25561 Pain in right knee: Secondary | ICD-10-CM | POA: Diagnosis not present

## 2014-12-29 MED ORDER — METHYLPREDNISOLONE ACETATE 40 MG/ML IJ SUSP
40.0000 mg | Freq: Once | INTRAMUSCULAR | Status: AC
  Administered 2014-12-29: 40 mg via INTRA_ARTICULAR

## 2014-12-29 MED ORDER — NITROGLYCERIN 0.2 MG/HR TD PT24: MEDICATED_PATCH | TRANSDERMAL | Status: DC

## 2014-12-29 MED ORDER — METHYLPREDNISOLONE ACETATE 40 MG/ML IJ SUSP
40.0000 mg | Freq: Once | INTRAMUSCULAR | Status: AC
Start: 2014-12-29 — End: 2014-12-29
  Administered 2014-12-29: 40 mg via INTRA_ARTICULAR

## 2014-12-29 NOTE — Patient Instructions (Signed)
You have Achilles Tendinopathy Calf raises 3 sets of 10 on level ground once a day first. When these are easy, can do them one legged 3 sets of 10. Finally advance to doing them on a step. Can add heel walks, toe walks forward and backward as well Ice bucket 10-15 minutes at end of day - can ice 3-4 times a day. Avoid uneven ground, hills as much as possible. Heel lifts in shoes or shoes with a natural heel lift. Nitro patches 1/4th patch to affected achilles, change daily. Consider physical therapy, orthotics if not improving as expected. Follow up in 6 weeks.

## 2014-12-31 NOTE — Progress Notes (Signed)
Patient ID: Laura [name], female   DOB: 09/24/1955, 59 y.o.   MRN: [id number]  Subjective:   PCP: Dr. Dema Severin  Knee Pain   59 yo F here for bilateral knee pain  02/29/12: Patient has known history of mod-severe DJD of bilateral knees. Last cortisone injections 3 months ago - started wearing off recently and going on a trip to [place] Has h/o PEs, on anticoagulation with coumadin - last INR in past week was 2.0. Pain currently worse in right knee than left - more lateral than medial but worse medially on left knee.  09/10/13: Patient returns today for bilateral knee injections. Tried synvisc twice since last visit here. First set helped for 3 months - second one did not help. At least a few months relief with cortisone shots in past. No new injuries.  01/15/14: Patient reports her knees started bothering her a couple weeks ago. Did well with cortisone shots. Taking tylenol.  04/17/14: Patient returns for bilateral knee injections for DJD. Did well since last visit and just recently they started bothering her again. Left knee 6/10 pain, right 4/10. No catching, locking, giving out.  4/18: Patient returns for repeat injections - both knees 3/10 level. She's also interested in viscosupplementation - working on getting approval Jacklyn Shell, our usual, is not approved by her insurance).  10/17: Patient returns for bilateral knee injections. Not much benefit last visit from cortisone injections - she did supartz by orthopedics and also not a whole lot of benefit. Constant dull ache in both knees 4/10 right, 6/10 left. Pain anterior, more medial. No catching, locking, giving out. Still bothered by her achilles on right as well - no new injuries. Doing some of the exercises. Has heel lifts. No skin changes, fever, other complaints.  Past Medical History  Diagnosis Date  . High blood pressure   . History of blood clots   . 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 Edema   . Herpes   . Obesity   . Vitamin D deficiency   . Partial tear of Achilles tendon   . Pulmonary embolism [facility])     Current Outpatient Prescriptions on File Prior to Visit  Medication Sig Dispense Refill  . carvedilol (COREG) 3.125 MG tablet Take 3.125 mg by mouth 2 (two) times daily with a meal.    . Cholecalciferol 5000 UNITS capsule Take 5,000 Units by mouth daily.    . diclofenac sodium (VOLTAREN) 1 % GEL Apply 2 g topically 4 (four) times daily. 3 Tube 1  . hydrochlorothiazide (HYDRODIURIL) 25 MG tablet Take 25 mg by mouth daily.    . rivaroxaban (XARELTO) 20 MG TABS tablet Take 1 tablet (20 mg total) by mouth daily with supper. 30 tablet 11   No current facility-administered medications on file prior to visit.    Past Surgical History  Procedure Laterality Date  . Cesarean section      1993 and 1997    No Known Allergies  Social History   Social History  . Marital Status: Single    Spouse Name: N/A  . Number of Children: 2  . 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 Smoking status: Never Smoker   . Smokeless tobacco: Never Used  . Alcohol Use: 0.0 oz/week    0 Standard drinks or equivalent per week     Comment: Rarely.  Marland Kitchen  Drug Use: No  . Sexual Activity: Not on file   Other Topics Concern  . Not on file   Social History Narrative    Family History  Problem Relation Age of Onset  . Hypertension Mother   . Heart failure Mother   . Prostate cancer Father   . Multiple myeloma Father   . Heart failure Father   . Heart attack Neg Hx   . Hyperlipidemia Neg Hx   . Diabetes Neg Hx   . Sudden death Neg Hx     BP 157/84 mmHg  Ht 5' 6.5" (1.689 m)  Wt 290 lb (131.543 kg)  BMI 46.11 kg/m2  Review of Systems See HPI above.    Objective:   Physical Exam Gen: NAD  R knee: No effusion, ecchymoses, other deformity.  2+ crepitation. TTP medial joint  line.  No lateral joint line, post patellar facet TTP. ROM 0 - 110 degrees. Negative ant/post drawers. Negative valgus/varus testing. Negative lachmanns. Negative mcmurrays, apleys, patellar apprehension. NV intact distally.  L knee: No effusion, ecchymoses, other deformity.  2+ crepitation. TTP medial joint line.  No lateral joint line, post patellar facet TTP. ROM 0 - 110 degrees. Negative ant/post drawers. Negative valgus/varus testing. Negative lachmanns. Negative mcmurrays, apleys, patellar apprehension. NV intact distally.  Right ankle: Soft tissue swelling throughout. FROM TTP achilles at insertion. Negative ant drawer and talar tilt.   Negative syndesmotic compression. Thompsons test negative. NV intact distally.  Left ankle: FROM without pain, tenderness.    Assessment & Plan:  1. Bilateral knee pain - 2/2 DJD.  Went ahead with repeated cortisone injections today.  S/p viscosupplementation without much benefit unfortunately.    After informed written consent, patient was seated on exam table. Right knee was prepped with alcohol swab and utilizing anterolateral approach, patient's right knee was injected intraarticularly with 3:1 marcaine: depomedrol. Patient tolerated the procedure well without immediate complications.  After informed written consent, patient was seated on exam table. Left knee was prepped with alcohol swab and utilizing anterolateral approach, patient's left knee was injected intraarticularly with 3:1 marcaine: depomedrol. Patient tolerated the procedure well without immediate complications.  2. Right achilles tendinitis - continue home exercises, heel lifts.  Icing.  Add nitro patches - discussed risks of headache, skin irritation.  F/u in 6 weeks.

## 2015-01-12 ENCOUNTER — Encounter: Payer: Self-pay | Admitting: Oncology

## 2015-01-12 ENCOUNTER — Ambulatory Visit (INDEPENDENT_AMBULATORY_CARE_PROVIDER_SITE_OTHER): Payer: BC Managed Care – PPO | Admitting: Oncology

## 2015-01-12 VITALS — BP 145/85 | HR 71 | Temp 97.7°F | Ht 66.5 in | Wt 293.1 lb

## 2015-01-12 DIAGNOSIS — I2782 Chronic pulmonary embolism: Secondary | ICD-10-CM

## 2015-01-12 DIAGNOSIS — Z7901 Long term (current) use of anticoagulants: Secondary | ICD-10-CM

## 2015-01-12 DIAGNOSIS — D688 Other specified coagulation defects: Secondary | ICD-10-CM

## 2015-01-12 DIAGNOSIS — D689 Coagulation defect, unspecified: Secondary | ICD-10-CM

## 2015-01-12 DIAGNOSIS — D649 Anemia, unspecified: Secondary | ICD-10-CM | POA: Diagnosis not present

## 2015-01-12 NOTE — Progress Notes (Signed)
Patient ID: Eddie NorthLori Vert, female   DOB: 02-10-1956, 59 y.o.   MRN: [id number] Hematology and Oncology Follow Up Visit  Eddie NorthLori Needs [id number] 02-10-1956 59 y.o. 01/12/2015 3:54 PM   Principle Diagnosis: Encounter Diagnoses  Name Primary?  . Coagulopathy (HCC) Yes  . 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 First event in November 2005. Recurrence in October 2007. She was first evaluated by me in July 2008. 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 There is epidemiologic evidence that this is a risk factor for clotting with the highest prevalence in African-American and Scandinavian populations. She has been on full dose Coumadin anticoagulation since that time with no subsequent thrombotic events. Additional testing showednormal antithrombin III level, negative for the factor V Leiden and prothrombin gene mutations, Negative antiphospholipid antibodies and beta 2 glycoprotein 1 antibodies, negative ANA, hepatitis C antibody negative, protein electrophoresis with a mild polyclonal elevation of both IgG and IgA at 2120 mg percent and 441 mg percent respectively. She had a mild normochromic anemia with borderline decrease ferritin of 13. She was put on iron and her hemoglobin started to come up. She was advised to get a colonoscopy. She has chronic degenerative arthritis of her knees. She has required intermittent steroid injections.   Interim History:   Her knees have been getting worse. She was concerned that the knee pain may have something to do with the Xarelto and called in anticipation of today's visit for advice. I advised her that I did not think the Xarelto was related to her symptoms. She has known degenerative arthritis. However, if she was uncomfortable with the Xarelto we could make a substitution. Xarelto has been  associated with muscle cramps but not joint symptoms in about 5% of patients. She elected to stay on the Xarelto until today's visit. In the interim she had another steroid injection with relief of her pain. She has had no other interim medical problems. She denies any dyspnea, chest pain, or palpitations. No calf pain or swelling. No change in her bowel habit. She still has not had a baseline colonoscopy. She has not had a mammogram in 3 years.  Medications: reviewed  Allergies: No Known Allergies  Review of Systems: See HPI Remaining ROS negative:   Physical Exam: Blood pressure 145/85, pulse 71, temperature 97.7 F (36.5 C), temperature source Oral, height 5' 6.5" (1.689 m), weight 293 lb 1.6 oz (132.949 kg), SpO2 97 %. Wt Readings from Last 3 Encounters:  01/12/15 293 lb 1.6 oz (132.949 kg)  12/29/14 290 lb (131.543 kg)  11/26/14 290 lb (131.543 kg)     General appearance: pleasant, overweight African American woman HENNT: Pharynx no erythema, exudate, mass, or ulcer. No thyromegaly or thyroid nodules Lymph nodes: No cervical, supraclavicular, or axillary lymphadenopathy Breasts: Lungs: Clear to auscultation, resonant to percussion throughout Heart: Regular rhythm, no murmur, no gallop, no rub, no click, no edema Abdomen: Soft, nontender, normal bowel sounds, no mass, no organomegaly Extremities: No edema, no calf tenderness Musculoskeletal: no joint deformities GU:  Vascular: Carotid pulses 2+, no bruits,  Neurologic: Alert, oriented, PERRLA, optic discs sharp and vessels normal, no hemorrhage or exudate, cranial nerves grossly normal, motor strength 5 over 5, reflexes 1+ symmetric upper extremities, absent symmetric lower extremities, upper body coordination normal, gait : She limps from the arthritis. Skin: No rash or ecchymosis  Lab Results: CBC  W/Diff    Component Value Date/Time   WBC 7.5 05/13/2014 1158   WBC 8.7 05/29/2012 1502   RBC 4.47 05/13/2014 1158   RBC  4.43 05/29/2012 1502   HGB 12.8 05/13/2014 1158   HGB 12.8 05/29/2012 1502   HCT 40.2 05/13/2014 1158   HCT 40.0 05/29/2012 1502   PLT 319 05/13/2014 1158   PLT 265 05/29/2012 1502   MCV 89.9 05/13/2014 1158   MCV 90.3 05/29/2012 1502   MCH 28.6 05/13/2014 1158   MCH 28.9 05/29/2012 1502   MCHC 31.8 05/13/2014 1158   MCHC 32.0 05/29/2012 1502   RDW 14.5 05/13/2014 1158   RDW 14.9* 05/29/2012 1502   LYMPHSABS 2.4 05/13/2014 1158   LYMPHSABS 2.9 05/29/2012 1502   MONOABS 0.7 05/13/2014 1158   MONOABS 0.7 05/29/2012 1502   EOSABS 0.2 05/13/2014 1158   EOSABS 0.3 05/29/2012 1502   BASOSABS 0.0 05/13/2014 1158   BASOSABS 0.0 05/29/2012 1502     Chemistry      Component Value Date/Time   NA 140 05/13/2014 1158   K 4.3 05/13/2014 1158   CL 102 05/13/2014 1158   CO2 28 05/13/2014 1158   BUN 18 05/13/2014 1158   CREATININE 0.83 05/13/2014 1158   CREATININE 0.9 03/04/2007 2336      Component Value Date/Time   CALCIUM 9.1 05/13/2014 1158   ALKPHOS 72 05/13/2014 1158   AST 17 05/13/2014 1158   ALT 13 05/13/2014 1158   BILITOT 0.4 05/13/2014 1158       Radiological Studies: No results found.  Impression:  #1. 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 History of recurrent pulmonary emboli secondary to #1 She will continue chronic anticoagulation. We had another lengthy discussion about the risk versus benefit of chronic anticoagulation. I reminded her that she has a genetic risk factor that will persist when she stops anticoagulation. Contemporary data continues to show significant clotting risk for patients with unprovoked pulmonary emboli who elect to stop anticoagulation. A large [place] clinical trial published in July, 2015 in a cohort of patients with unprovoked pulmonary emboli showed that as long as one stayed on therapeutic anticoagulation one was protected but as soon as one stopped, there was a 5 fold increase in recurrent pulmonary emboli. We discussed  aspirin as a prophylactic measure. In two small studies this was shown to reduce recurrent thrombotic events by about 30%. Certainly suboptimal to standard anticoagulation. One interesting extension study looking at apixiban reported good protection with a 50% dose reduction after a 6-12 month treatment at full dose. If this can be confirmed by other studies, it may be a good alternative for people like Mrs. Hensley to provide significant benefit but minimize bleeding complications.  #3. Chronic, mild, normochromic anemia Hemoglobin stable at 12.8 g MCV 90. Myeloma screen negative.  #4. Degenerative arthritis.   #5. Health maintenance She is encouraged to schedule a baseline colonoscopy and to get a follow-up mammogram.  CC: Patient Care Team: Laurann Montana, MD as PCP - General (Family Medicine)   Levert Feinstein, MD 10/31/20163:54 PM

## 2015-01-12 NOTE — Patient Instructions (Signed)
Return visit 1 year Lab here 1 week before visit

## 2015-02-10 ENCOUNTER — Ambulatory Visit: Payer: BC Managed Care – PPO | Admitting: Family Medicine

## 2015-02-19 ENCOUNTER — Encounter: Payer: Self-pay | Admitting: Podiatry

## 2015-02-19 ENCOUNTER — Ambulatory Visit (INDEPENDENT_AMBULATORY_CARE_PROVIDER_SITE_OTHER): Payer: BC Managed Care – PPO | Admitting: Podiatry

## 2015-02-19 VITALS — BP 127/88 | HR 82 | Resp 12

## 2015-02-19 DIAGNOSIS — M722 Plantar fascial fibromatosis: Secondary | ICD-10-CM | POA: Diagnosis not present

## 2015-02-19 MED ORDER — TRIAMCINOLONE ACETONIDE 10 MG/ML IJ SUSP
10.0000 mg | Freq: Once | INTRAMUSCULAR | Status: AC
  Administered 2015-02-19: 10 mg

## 2015-02-22 NOTE — Progress Notes (Signed)
Subjective:     Patient ID: Laura [name], female   DOB: 1956/03/12, 59 y.o.   MRN: [id number]  HPI patient states my heels on both feet have become sore again and they did very well for a number of months   Review of Systems     Objective:   Physical Exam Neurovascular status intact muscle strength was adequate with discomfort in the plantar aspect of the heel bilateral medial band at the insertion of the tendon into the calcaneus    Assessment:     Continued acute plantar fasciitis bilateral    Plan:     Reinjected the plantar fascia bilateral 3 Milligan Kenalog 5 mg Xylocaine and advised on shoe gear modifications and stretching exercises

## 2015-04-08 ENCOUNTER — Ambulatory Visit (INDEPENDENT_AMBULATORY_CARE_PROVIDER_SITE_OTHER): Payer: BC Managed Care – PPO | Admitting: Family Medicine

## 2015-04-08 ENCOUNTER — Encounter: Payer: Self-pay | Admitting: Family Medicine

## 2015-04-08 VITALS — BP 118/81 | HR 92 | Ht 67.0 in | Wt 260.0 lb

## 2015-04-08 DIAGNOSIS — M25561 Pain in right knee: Secondary | ICD-10-CM

## 2015-04-08 DIAGNOSIS — M25562 Pain in left knee: Secondary | ICD-10-CM | POA: Diagnosis not present

## 2015-04-08 MED ORDER — METHYLPREDNISOLONE ACETATE 40 MG/ML IJ SUSP
40.0000 mg | Freq: Once | INTRAMUSCULAR | Status: AC
  Administered 2015-04-08: 40 mg via INTRA_ARTICULAR

## 2015-04-08 MED ORDER — METHYLPREDNISOLONE ACETATE 40 MG/ML IJ SUSP
40.0000 mg | Freq: Once | INTRAMUSCULAR | Status: AC
Start: 2015-04-08 — End: 2015-04-08
  Administered 2015-04-08: 40 mg via INTRA_ARTICULAR

## 2015-04-10 NOTE — Assessment & Plan Note (Signed)
2/2 DJD.  Went ahead with repeated cortisone injections today.  S/p viscosupplementation without much benefit unfortunately.  Can f/u in 3 months for repeat cortisone injections.  Consider ortho referral as well.  After informed written consent, patient was seated on exam table. Right knee was prepped with alcohol swab and utilizing anterolateral approach, patient's right knee was injected intraarticularly with 3:1 marcaine: depomedrol. Patient tolerated the procedure well without immediate complications.  After informed written consent, patient was seated on exam table. Left knee was prepped with alcohol swab and utilizing anterolateral approach, patient's left knee was injected intraarticularly with 3:1 marcaine: depomedrol. Patient tolerated the procedure well without immediate complications.

## 2015-04-10 NOTE — Progress Notes (Signed)
Patient ID: Laura [name], female   DOB: 1955-03-18, 60 y.o.   MRN: [id number]  Subjective:   PCP: Dr. Dema Severin  Knee Pain   60 yo F here for bilateral knee pain  02/29/12: Patient has known history of mod-severe DJD of bilateral knees. Last cortisone injections 3 months ago - started wearing off recently and going on a trip to [place] Has h/o PEs, on anticoagulation with coumadin - last INR in past week was 2.0. Pain currently worse in right knee than left - more lateral than medial but worse medially on left knee.  09/10/13: Patient returns today for bilateral knee injections. Tried synvisc twice since last visit here. First set helped for 3 months - second one did not help. At least a few months relief with cortisone shots in past. No new injuries.  01/15/14: Patient reports her knees started bothering her a couple weeks ago. Did well with cortisone shots. Taking tylenol.  04/17/14: Patient returns for bilateral knee injections for DJD. Did well since last visit and just recently they started bothering her again. Left knee 6/10 pain, right 4/10. No catching, locking, giving out.  4/18: Patient returns for repeat injections - both knees 3/10 level. She's also interested in viscosupplementation - working on getting approval Jacklyn Shell, our usual, is not approved by her insurance).  10/17: Patient returns for bilateral knee injections. Not much benefit last visit from cortisone injections - she did supartz by orthopedics and also not a whole lot of benefit. Constant dull ache in both knees 4/10 right, 6/10 left. Pain anterior, more medial. No catching, locking, giving out. Still bothered by her achilles on right as well - no new injuries. Doing some of the exercises. Has heel lifts. No skin changes, fever, other complaints.  04/08/15: Patient returns as pain started to recur past couple weeks in both knees. Pain level 0/10 at rest, up to 4/10 on left, 2/10 on  right especially with walking. Pain is sharp, anteromedial. Worse when getting up from seated position also. No skin changes, fever, other complaints.  Past Medical History  Diagnosis Date  . High blood pressure   . History of blood clots   . 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 Edema   . Herpes   . Obesity   . Vitamin D deficiency   . Partial tear of Achilles tendon   . Pulmonary embolism [facility])     Current Outpatient Prescriptions on File Prior to Visit  Medication Sig Dispense Refill  . amoxicillin (AMOXIL) 500 MG capsule     . carvedilol (COREG) 3.125 MG tablet Take 3.125 mg by mouth 2 (two) times daily with a meal.    . carvedilol (COREG) 6.25 MG tablet     . Cholecalciferol 5000 UNITS capsule Take 5,000 Units by mouth daily.    . diclofenac sodium (VOLTAREN) 1 % GEL Apply 2 g topically 4 (four) times daily. 3 Tube 1  . furosemide (LASIX) 20 MG tablet     . hydrochlorothiazide (HYDRODIURIL) 25 MG tablet Take 25 mg by mouth daily.    Marland Kitchen KLOR-CON M20 20 MEQ tablet     . nitroGLYCERIN (MINITRAN) 0.2 mg/hr patch Apply 1/4th patch to affected achilles, change daily 30 patch 1  . rivaroxaban (XARELTO) 20 MG TABS tablet Take 1 tablet (20 mg total) by mouth daily with supper. 30 tablet 11   No current facility-administered medications on file prior to visit.  Past Surgical History  Procedure Laterality Date  . Cesarean section      1993 and 1997    No Known Allergies  Social History   Social History  . Marital Status: Single    Spouse Name: N/A  . Number of Children: 2  . 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 Smoking status: Never Smoker   . Smokeless tobacco: Never Used  . Alcohol Use: 0.0 oz/week    0 Standard drinks or equivalent per week     Comment: Rarely.  . Drug Use: No  . Sexual Activity: Not on file   Other Topics Concern  . Not on file   Social  History Narrative    Family History  Problem Relation Age of Onset  . Hypertension Mother   . Heart failure Mother   . Prostate cancer Father   . Multiple myeloma Father   . Heart failure Father   . Heart attack Neg Hx   . Hyperlipidemia Neg Hx   . Diabetes Neg Hx   . Sudden death Neg Hx     BP 118/81 mmHg  Pulse 92  Ht _0  (1.702 m)  Wt 260 lb (117.935 kg)  BMI 40.71 kg/m2  Review of Systems See HPI above.    Objective:   Physical Exam Gen: NAD  R knee: No effusion, ecchymoses, other deformity.  2+ crepitation. TTP medial joint line.  No lateral joint line, post patellar facet TTP. ROM 0 - 110 degrees. Negative ant/post drawers. Negative valgus/varus testing. Negative lachmanns. Negative mcmurrays, apleys, patellar apprehension. NV intact distally.  L knee: No effusion, ecchymoses, other deformity.  2+ crepitation. TTP medial joint line.  No lateral joint line, post patellar facet TTP. ROM 0 - 110 degrees. Negative ant/post drawers. Negative valgus/varus testing. Negative lachmanns. Negative mcmurrays, apleys, patellar apprehension. NV intact distally.    Assessment & Plan:  1. Bilateral knee pain - 2/2 DJD.  Went ahead with repeated cortisone injections today.  S/p viscosupplementation without much benefit unfortunately.  Can f/u in 3 months for repeat cortisone injections.  Consider ortho referral as well.  After informed written consent, patient was seated on exam table. Right knee was prepped with alcohol swab and utilizing anterolateral approach, patient's right knee was injected intraarticularly with 3:1 marcaine: depomedrol. Patient tolerated the procedure well without immediate complications.  After informed written consent, patient was seated on exam table. Left knee was prepped with alcohol swab and utilizing anterolateral approach, patient's left knee was injected intraarticularly with 3:1 marcaine: depomedrol. Patient tolerated the procedure well  without immediate complications.

## 2015-05-12 ENCOUNTER — Ambulatory Visit
Admission: RE | Admit: 2015-05-12 | Discharge: 2015-05-12 | Disposition: A | Discharge status: Home / Self Care | Payer: BC Managed Care – PPO | Source: Ambulatory Visit

## 2015-05-12 DIAGNOSIS — Z1231 Encounter for screening mammogram for malignant neoplasm of breast: Secondary | ICD-10-CM

## 2015-05-14 ENCOUNTER — Other Ambulatory Visit: Payer: Self-pay | Admitting: Family Medicine

## 2015-05-14 DIAGNOSIS — R928 Other abnormal and inconclusive findings on diagnostic imaging of breast: Secondary | ICD-10-CM

## 2015-05-19 ENCOUNTER — Ambulatory Visit: Payer: BC Managed Care – PPO | Admitting: Podiatry

## 2015-05-19 ENCOUNTER — Ambulatory Visit
Admission: RE | Admit: 2015-05-19 | Discharge: 2015-05-19 | Disposition: A | Discharge status: Home / Self Care | Payer: BC Managed Care – PPO | Source: Ambulatory Visit | Attending: Family Medicine | Admitting: Family Medicine

## 2015-05-19 DIAGNOSIS — R928 Other abnormal and inconclusive findings on diagnostic imaging of breast: Secondary | ICD-10-CM

## 2015-05-20 ENCOUNTER — Encounter: Payer: Self-pay | Admitting: Podiatry

## 2015-05-20 ENCOUNTER — Ambulatory Visit (INDEPENDENT_AMBULATORY_CARE_PROVIDER_SITE_OTHER): Payer: BC Managed Care – PPO

## 2015-05-20 ENCOUNTER — Ambulatory Visit (INDEPENDENT_AMBULATORY_CARE_PROVIDER_SITE_OTHER): Payer: BC Managed Care – PPO | Admitting: Podiatry

## 2015-05-20 VITALS — Resp 16

## 2015-05-20 DIAGNOSIS — M79671 Pain in right foot: Secondary | ICD-10-CM

## 2015-05-20 DIAGNOSIS — M7661 Achilles tendinitis, right leg: Secondary | ICD-10-CM

## 2015-05-20 MED ORDER — TRIAMCINOLONE ACETONIDE 10 MG/ML IJ SUSP
10.0000 mg | Freq: Once | INTRAMUSCULAR | Status: AC
  Administered 2015-05-20: 10 mg

## 2015-05-20 NOTE — Patient Instructions (Signed)

## 2015-05-21 ENCOUNTER — Ambulatory Visit: Payer: BC Managed Care – PPO | Admitting: Podiatry

## 2015-06-02 ENCOUNTER — Other Ambulatory Visit: Payer: Self-pay | Admitting: Oncology

## 2015-06-02 ENCOUNTER — Telehealth: Payer: Self-pay | Admitting: Internal Medicine

## 2015-06-02 DIAGNOSIS — I2699 Other pulmonary embolism without acute cor pulmonale: Secondary | ICD-10-CM

## 2015-06-02 DIAGNOSIS — D689 Coagulation defect, unspecified: Secondary | ICD-10-CM

## 2015-06-02 MED ORDER — RIVAROXABAN 20 MG PO TABS: 20.0000 mg | ORAL_TABLET | Freq: Every day | ORAL | Status: DC

## 2015-06-02 NOTE — Telephone Encounter (Signed)
INTERNAL MEDICINE RESIDENCY PROGRAM After-Hours Telephone Call    Reason for call:   I received a call from [name] on 06/02/2015 at 6:45 PM indicating she is out of xarelto refills and was not eligible for an emergency refill b/c paperwork came back stating she was not a clinic patient.     Pertinent Data:   Pt has not been on xarelto for the past 2 days.   Hx of PE    Assessment / Plan / Recommendations:   Sent in refill for xarelto 20mg  BID, verified her pharmacy. Instructed pt to call back if she is still unable to get xarelto refilled.   As always, pt is advised that if symptoms worsen or new symptoms arise, they should go to an urgent care facility or to to ER for further evaluation.    [place] Laura M Truong, MD   06/02/2015, 6:45 PM

## 2015-06-03 NOTE — Telephone Encounter (Signed)
Thanks! DrG 

## 2015-06-10 ENCOUNTER — Encounter: Payer: Self-pay | Admitting: Podiatry

## 2015-06-10 ENCOUNTER — Ambulatory Visit (INDEPENDENT_AMBULATORY_CARE_PROVIDER_SITE_OTHER): Payer: BC Managed Care – PPO | Admitting: Podiatry

## 2015-06-10 VITALS — BP 112/78 | HR 85 | Resp 16

## 2015-06-10 DIAGNOSIS — M7661 Achilles tendinitis, right leg: Secondary | ICD-10-CM | POA: Diagnosis not present

## 2015-06-10 NOTE — Progress Notes (Signed)
Subjective:     Patient ID: Laura [name], female   DOB: 07/06/1955, 60 y.o.   MRN: [id number]  HPI patient presents with a lot of pain in the medial side of the Achilles tendon right with inflammation and fluid noted. Patient states it's been bothering her and that the injection only helped for about a week and obesity is complicating factor   Review of Systems     Objective:   Physical Exam Neurovascular status intact muscle strength adequate with exquisite discomfort in the medial side of the right Achilles at the insertion of the tendon into the calcaneus    Assessment:     Acute Achilles tendinitis medial side right    Plan:     Reviewed condition at great length and did careful injection of the medial Achilles 3 mg Kenalog 5 mill grams Xylocaine after discussing risk and chances for rupture. If symptoms persist we'll need to consider shockwave therapy

## 2015-06-11 NOTE — Progress Notes (Signed)
Subjective:     Patient ID: Laura [name], female   DOB: Mar 19, 1955, 60 y.o.   MRN: [id number]  HPI patient states that there is quite a bit of discomfort in the Achilles tendon right medial side and that the injection only helped for several days   Review of Systems     Objective:   Physical Exam Neurovascular status intact muscle strength adequate with discomfort in the Achilles tendon right medial side with inflammation fluid buildup noted    Assessment:     Obesity along with Achilles tendinitis right    Plan:     We will begin physical therapy for this and I went ahead and scheduled for shockwave to the medial Achilles explaining this may or may not cure the problem but it's the best chance we have short of surgery which I believe would be extremely difficult for her given her obesity issue she experiences. Scheduled for shockwave therapy

## 2015-06-17 ENCOUNTER — Ambulatory Visit (INDEPENDENT_AMBULATORY_CARE_PROVIDER_SITE_OTHER): Payer: BC Managed Care – PPO | Admitting: Podiatry

## 2015-06-17 DIAGNOSIS — M7661 Achilles tendinitis, right leg: Secondary | ICD-10-CM

## 2015-06-17 DIAGNOSIS — B351 Tinea unguium: Secondary | ICD-10-CM

## 2015-06-18 NOTE — Progress Notes (Signed)
Subjective:     Patient ID: Eddie NorthLori Nickell, female   DOB: Jan 28, 1956, 60 y.o.   MRN: [id number]  HPI patient presents for initial shockwave right Achilles tendon   Review of Systems     Objective:   Physical Exam Neurovascular status intact with exquisite discomfort in the right Achilles medial side    Assessment:     Achilles tendinitis right medial    Plan:     Shockwave administered 3000 pulses approximate 3.0 intensity 17 frequency and reappoint in 1 week

## 2015-06-24 ENCOUNTER — Ambulatory Visit (INDEPENDENT_AMBULATORY_CARE_PROVIDER_SITE_OTHER): Payer: BC Managed Care – PPO

## 2015-06-24 DIAGNOSIS — M7661 Achilles tendinitis, right leg: Secondary | ICD-10-CM

## 2015-06-24 NOTE — Progress Notes (Signed)
   Subjective:    Patient ID: Laura NorthLori [name], female    DOB: August 30, 1955, 60 y.o.   MRN: [id number]  HPI Pt presents for 2nd shockwave therapy treatment, she states that she has not seen much change in her pain   Review of Systems Discomfort right medial achilles    Objective:   Physical Exam  Achilles right foot medial side, pain and tenderness      Assessment & Plan:  Shockwave therapy #2 administered to medial side of her right achilles at 3.8 17hz  for 3000 pulses. Re-appointed in 1 week for 3rd treatment

## 2015-07-01 ENCOUNTER — Other Ambulatory Visit: Payer: BC Managed Care – PPO

## 2015-07-01 ENCOUNTER — Ambulatory Visit (INDEPENDENT_AMBULATORY_CARE_PROVIDER_SITE_OTHER): Payer: BC Managed Care – PPO

## 2015-07-01 DIAGNOSIS — M7661 Achilles tendinitis, right leg: Secondary | ICD-10-CM

## 2015-07-01 NOTE — Progress Notes (Signed)
   Subjective:    Patient ID: Laura [name], female    DOB: [birth year], 60 y.o.   MRN: [id number]  HPI Pt presents for 2nd shockwave therapy treatment, she states that she has not seen much change in her pain   Review of Systems Discomfort right medial achilles    Objective:   Physical Exam  Achilles right foot medial side, pain and tenderness      Assessment & Plan:  Shockwave therapy #2 administered to medial side of her right achilles at 4.2 17hz  for 3000 pulses. Re-appointed in 4 weeks for re-evaluation and 4th treatment

## 2015-07-08 ENCOUNTER — Ambulatory Visit (INDEPENDENT_AMBULATORY_CARE_PROVIDER_SITE_OTHER): Payer: BC Managed Care – PPO | Admitting: Family Medicine

## 2015-07-08 ENCOUNTER — Encounter: Payer: Self-pay | Admitting: Family Medicine

## 2015-07-08 VITALS — BP 143/94 | HR 82 | Ht 67.0 in | Wt 265.0 lb

## 2015-07-08 DIAGNOSIS — M25561 Pain in right knee: Secondary | ICD-10-CM | POA: Diagnosis not present

## 2015-07-08 DIAGNOSIS — M25562 Pain in left knee: Secondary | ICD-10-CM | POA: Diagnosis not present

## 2015-07-08 DIAGNOSIS — M7661 Achilles tendinitis, right leg: Secondary | ICD-10-CM | POA: Diagnosis not present

## 2015-07-08 MED ORDER — METHYLPREDNISOLONE ACETATE 40 MG/ML IJ SUSP
40.0000 mg | Freq: Once | INTRAMUSCULAR | Status: AC
  Administered 2015-07-08: 40 mg via INTRA_ARTICULAR

## 2015-07-09 NOTE — Assessment & Plan Note (Signed)
2/2 DJD.  Went ahead with repeated cortisone injections today.  Did well up to a week ago.  Can f/u in 3 months for repeat cortisone injections if needed.  Didn't have much benefit with viscosupplementation.  Consider ortho referral as well.  After informed written consent, patient was seated on exam table. Right knee was prepped with alcohol swab and utilizing anterolateral approach, patient's right knee was injected intraarticularly with 3:1 marcaine: depomedrol. Patient tolerated the procedure well without immediate complications.  After informed written consent, patient was seated on exam table. Left knee was prepped with alcohol swab and utilizing anterolateral approach, patient's left knee was injected intraarticularly with 3:1 marcaine: depomedrol. Patient tolerated the procedure well without immediate complications.

## 2015-07-09 NOTE — Progress Notes (Signed)
Patient ID: Laura [name], female   DOB: 06/06/1955, 60 y.o.   MRN: [id number]  Subjective:   PCP: Dr. Dema Severin  Knee Pain   60 yo F here for bilateral knee pain  02/29/12: Patient has known history of mod-severe DJD of bilateral knees. Last cortisone injections 3 months ago - started wearing off recently and going on a trip to [place] Has h/o PEs, on anticoagulation with coumadin - last INR in past week was 2.0. Pain currently worse in right knee than left - more lateral than medial but worse medially on left knee.  09/10/13: Patient returns today for bilateral knee injections. Tried synvisc twice since last visit here. First set helped for 3 months - second one did not help. At least a few months relief with cortisone shots in past. No new injuries.  01/15/14: Patient reports her knees started bothering her a couple weeks ago. Did well with cortisone shots. Taking tylenol.  04/17/14: Patient returns for bilateral knee injections for DJD. Did well since last visit and just recently they started bothering her again. Left knee 6/10 pain, right 4/10. No catching, locking, giving out.  4/18: Patient returns for repeat injections - both knees 3/10 level. She's also interested in viscosupplementation - working on getting approval Jacklyn Shell, our usual, is not approved by her insurance).  10/17: Patient returns for bilateral knee injections. Not much benefit last visit from cortisone injections - she did supartz by orthopedics and also not a whole lot of benefit. Constant dull ache in both knees 4/10 right, 6/10 left. Pain anterior, more medial. No catching, locking, giving out. Still bothered by her achilles on right as well - no new injuries. Doing some of the exercises. Has heel lifts. No skin changes, fever, other complaints.  04/08/15: Patient returns as pain started to recur past couple weeks in both knees. Pain level 0/10 at rest, up to 4/10 on left, 2/10 on  right especially with walking. Pain is sharp, anteromedial. Worse when getting up from seated position also. No skin changes, fever, other complaints.  4/26: Patient returns for bilateral knee injections. Pain level 5/10 bilaterally now. Worse with walking. No skin changes, numbness.  Past Medical History  Diagnosis Date  . High blood pressure   . History of blood clots   . 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 Edema   . Herpes   . Obesity   . Vitamin D deficiency   . Partial tear of Achilles tendon   . Pulmonary embolism [facility])     Current Outpatient Prescriptions on File Prior to Visit  Medication Sig Dispense Refill  . carvedilol (COREG) 6.25 MG tablet     . Cholecalciferol 5000 UNITS capsule Take 5,000 Units by mouth daily.    . furosemide (LASIX) 20 MG tablet     . hydrochlorothiazide (HYDRODIURIL) 25 MG tablet Take 25 mg by mouth daily.    Marland Kitchen KLOR-CON M20 20 MEQ tablet     . nitroGLYCERIN (MINITRAN) 0.2 mg/hr patch Apply 1/4th patch to affected achilles, change daily 30 patch 1  . rivaroxaban (XARELTO) 20 MG TABS tablet Take 1 tablet (20 mg total) by mouth daily with supper. 30 tablet 11   No current facility-administered medications on file prior to visit.    Past Surgical History  Procedure Laterality Date  . Cesarean section      1993 and 1997    No Known Allergies  Social  History   Social History  . Marital Status: Single    Spouse Name: N/A  . Number of Children: 2  . 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 Smoking status: Never Smoker   . Smokeless tobacco: Never Used  . Alcohol Use: 0.0 oz/week    0 Standard drinks or equivalent per week     Comment: Rarely.  . Drug Use: No  . Sexual Activity: Not on file   Other Topics Concern  . Not on file   Social History Narrative    Family History  Problem Relation Age of Onset  . Hypertension  Mother   . Heart failure Mother   . Prostate cancer Father   . Multiple myeloma Father   . Heart failure Father   . Heart attack Neg Hx   . Hyperlipidemia Neg Hx   . Diabetes Neg Hx   . Sudden death Neg Hx     BP 143/94 mmHg  Pulse 82  Ht 5' 7"  (1.702 m)  Wt 265 lb (120.203 kg)  BMI 41.50 kg/m2  Review of Systems See HPI above.    Objective:   Physical Exam Gen: NAD  R knee: No effusion, ecchymoses, other deformity.  2+ crepitation. TTP medial joint line.    L knee: No effusion, ecchymoses, other deformity.  2+ crepitation. TTP medial joint line.    Assessment & Plan:  1. Bilateral knee pain - 2/2 DJD.  Went ahead with repeated cortisone injections today.  Did well up to a week ago.  Can f/u in 3 months for repeat cortisone injections if needed.  Didn't have much benefit with viscosupplementation.  Consider ortho referral as well.  After informed written consent, patient was seated on exam table. Right knee was prepped with alcohol swab and utilizing anterolateral approach, patient's right knee was injected intraarticularly with 3:1 marcaine: depomedrol. Patient tolerated the procedure well without immediate complications.  After informed written consent, patient was seated on exam table. Left knee was prepped with alcohol swab and utilizing anterolateral approach, patient's left knee was injected intraarticularly with 3:1 marcaine: depomedrol. Patient tolerated the procedure well without immediate complications.

## 2015-07-30 ENCOUNTER — Ambulatory Visit (INDEPENDENT_AMBULATORY_CARE_PROVIDER_SITE_OTHER): Payer: BC Managed Care – PPO

## 2015-07-30 DIAGNOSIS — M7661 Achilles tendinitis, right leg: Secondary | ICD-10-CM

## 2015-07-30 NOTE — Progress Notes (Signed)
   Subjective:    Patient ID: Laura NorthLori Fein, female    DOB: Mar 24, 1955, 60 y.o.   MRN: [id number]  HPI Pt presents for 4th shockwave therapy treatment, she states that she has not seen much change in her pain   Review of Systems Discomfort right medial achilles    Objective:   Physical Exam  Achilles right foot medial side, pain and tenderness      Assessment & Plan:  Shockwave therapy #4 administered to medial side of her right achilles using ESWR at 13 joules at 0.20 for 3000 pulses. EPAT administered to surrounding connective tissues. Re-appointed as need if symptoms do not improve.

## 2015-08-17 NOTE — Addendum Note (Signed)
Addended by: Bufford SpikesFULCHER, Catrell Morrone N on: 08/17/2015 01:28 PM   Modules accepted: Kipp BroodSmartSet

## 2015-09-02 ENCOUNTER — Ambulatory Visit (INDEPENDENT_AMBULATORY_CARE_PROVIDER_SITE_OTHER): Payer: BC Managed Care – PPO | Admitting: Podiatry

## 2015-09-02 ENCOUNTER — Encounter: Payer: Self-pay | Admitting: Podiatry

## 2015-09-02 DIAGNOSIS — M7661 Achilles tendinitis, right leg: Secondary | ICD-10-CM

## 2015-09-02 MED ORDER — TRIAMCINOLONE ACETONIDE 10 MG/ML IJ SUSP
10.0000 mg | Freq: Once | INTRAMUSCULAR | Status: AC
  Administered 2015-09-02: 10 mg

## 2015-09-02 NOTE — Progress Notes (Signed)
   Subjective:    Patient ID: Eddie NorthLori Dowis, female    DOB: 1955/05/20, 60 y.o.   MRN: [id number]  HPI "It's follow-up on my Achilles Tendinitis.  It's not doing good.  I been having the Shockwave.  I was told if it wasn't better to see Dr. Charlsie Merlesegal this visit."    Review of Systems     Objective:   Physical Exam        Assessment & Plan:

## 2015-09-03 NOTE — Progress Notes (Signed)
Subjective:     Patient ID: Laura [name], female   DOB: [birth year], 60 y.o.   MRN: [id number]  HPI patient states the inside of the heel seems to be doing much better but the outside of the heel is quite sore. Patient states that the shockwave did seem to help the one side   Review of Systems     Objective:   Physical Exam Neurovascular status unchanged with patient having extreme obesity which is a complicating factor and is noted to have inflammation on the lateral side of the Achilles right with the medial side doing much better upon palpation    Assessment:     Improved medial band Achilles tendinitis right with lateral Achilles tendinitis right    Plan:     Reviewed conditions and since we have never worked on the lateral side I did a careful injection explaining chances for rupture before doing it with 3 mg dexamethasone and 5 mg Marcaine with a small amount of Kenalog to reduce inflammation. Advised on reduced activity and may have to pursue shockwave on the lateral side

## 2015-09-09 ENCOUNTER — Encounter: Payer: Self-pay | Admitting: Sports Medicine

## 2015-09-09 ENCOUNTER — Ambulatory Visit (INDEPENDENT_AMBULATORY_CARE_PROVIDER_SITE_OTHER): Payer: BC Managed Care – PPO | Admitting: Sports Medicine

## 2015-09-09 VITALS — BP 143/83 | HR 74 | Ht 67.0 in | Wt 280.0 lb

## 2015-09-09 DIAGNOSIS — M7661 Achilles tendinitis, right leg: Secondary | ICD-10-CM | POA: Diagnosis not present

## 2015-09-09 MED ORDER — NITROGLYCERIN 0.2 MG/HR TD PT24: MEDICATED_PATCH | TRANSDERMAL | Status: DC

## 2015-09-09 NOTE — Progress Notes (Signed)
  Subjective:   Laura [name] is a 60 y.o. female with a history of partial tear in Achilles (unknown side) several years ago, coagulopathy, h/o PE here for R heel pain.  Patient reports pain has been present for past 11 months.  She has seen Dr. Pearletha ForgeHudnall [facility] and podiatry.  She has tried shockwave, stretching, and steroid injections for Achilles tendinosis.  Pain is worst over lateral posterior R heel.  It hurst more with walking and feels stiff after sitting for a while.  She has noticed that wearing a shoe with a wedged elevated heel helps.    She cannot take NSAIDs due to anti-coagulation.  She tried tylenol which helps a little.  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  She is not exercising.   Review of Systems:  Per HPI. Denies weakness, numbness, fevers, chills Gets some relief wearing heels Swelling of ankles at night  Social History: never smoker  Objective:  BP 143/83 mmHg  Pulse 74  Ht 5\' 7"  (1.702 m)  Wt 280 lb (127.007 kg)  BMI 43.84 kg/m2  Gen:  60 y.o. female in NAD, morbidly obesity MSK: R ankle: ROM intact and symmetric to L side TTP over R lateral posterior heel near insertion of achilles into calcaneous Varicosities and generalized lower leg swelling Sinus tarsi swelling R and L  MS[facility] US R heel (09/09/2015) - bursal inflammation between calcaneous and Achilles tendon. No Haglan's deformity. Edema in the tendon sheath with fraying of distal Achilles tendon measuring 1.12x2.65cm, but normal in size 1 cm proximal to this area.    Assessment & Plan:     Laura [name] is a 60 y.o. female here for R Achilles tendinosis.  Notable changes in tendon structure noted on US today without evidence of focal tear.  Concern that stress may lead to rupture. - advised patient to wear heel lift or heeled shoes all the time - while sitting, 3x30 heel lifts I do not think she can do body weight heel lifts -avoid weight bearing stretching of tendon at current time to  avoid rupture - nitroglycerin protocol - f/u in 6 weeks and re-US at that time    Erasmo DownerAngela M Bacigalupo, MD MPH PGY-2,  [facility] 09/09/2015  11:27 AM     Agree with this assessment and results of US.  Vedia CofferKB Fields,MD

## 2015-09-09 NOTE — Assessment & Plan Note (Signed)
This is severe with AP measure of tendon at 1.12 cm and marked fraying  See plan in OV for NTG protocol  Also use body helix ankle compression for support and control of swelling

## 2015-09-09 NOTE — Patient Instructions (Addendum)
Nitroglycerin Protocol   Apply 1/4 nitroglycerin patch to affected area daily.  Change position of patch within the affected area every 24 hours.  You may experience a headache during the first 1-2 weeks of using the patch, these should subside.  If you experience headaches after beginning nitroglycerin patch treatment, you may take your preferred over the counter pain reliever.  Another side effect of the nitroglycerin patch is skin irritation or rash related to patch adhesive.  Please notify our office if you develop more severe headaches or rash, and stop the patch.  Tendon healing with nitroglycerin patch may require 12 to 24 weeks depending on the extent of injury.  Men should not use if taking Viagra, Cialis, or Levitra.   Do not use if you have migraines or rosacea.   Ankle sleeve for support - get body helix XL full ankle   Always wear shoe with heel wedge or heel lift in your sneakers  While sitting in a chair, do 3 sets of 30 heel lifts daily Stop other stretching exercises  Exercise bike would be good for you  Come back in 6 weeks and we will re-ultrasound the area

## 2015-09-16 ENCOUNTER — Ambulatory Visit (INDEPENDENT_AMBULATORY_CARE_PROVIDER_SITE_OTHER): Payer: BC Managed Care – PPO | Admitting: Podiatry

## 2015-09-16 ENCOUNTER — Encounter: Payer: Self-pay | Admitting: Podiatry

## 2015-09-16 DIAGNOSIS — M7661 Achilles tendinitis, right leg: Secondary | ICD-10-CM

## 2015-09-17 NOTE — Progress Notes (Signed)
Subjective:     Patient ID: Laura [name], female   DOB: 10/10/1955, 60 y.o.   MRN: [id number]  HPI patient presents stating the inside of the heel is doing well but she is developing problems with the outside of the heel and the injection that we did has not been successful in resolving symptoms   Review of Systems     Objective:   Physical Exam Neurovascular status intact with obese female who had medial fascial pain in the Achilles tendon right which has improved quite well with shockwave and lateral side remains a problem    Assessment:     Lateral Achilles tendinitis right    Plan:     Reviewed her great length and recommended conservative care consisting of shockwave today and one more treatment to the lateral side next week. My options are limited due to the extreme obesity and other symptoms that she has and I'm hoping that this will provide her relief and I spent a great of time today going over the limited options that we have in her particular condition

## 2015-09-23 ENCOUNTER — Other Ambulatory Visit (HOSPITAL_COMMUNITY)
Admission: RE | Admit: 2015-09-23 | Discharge: 2015-09-23 | Disposition: A | Discharge status: Home / Self Care | Payer: BC Managed Care – PPO | Source: Ambulatory Visit | Attending: Family Medicine | Admitting: Family Medicine

## 2015-09-23 ENCOUNTER — Other Ambulatory Visit: Payer: Self-pay | Admitting: Family Medicine

## 2015-09-23 DIAGNOSIS — Z124 Encounter for screening for malignant neoplasm of cervix: Secondary | ICD-10-CM | POA: Insufficient documentation

## 2015-09-23 DIAGNOSIS — Z1151 Encounter for screening for human papillomavirus (HPV): Secondary | ICD-10-CM | POA: Diagnosis not present

## 2015-09-24 ENCOUNTER — Ambulatory Visit: Payer: BC Managed Care – PPO

## 2015-09-24 DIAGNOSIS — M7661 Achilles tendinitis, right leg: Secondary | ICD-10-CM

## 2015-09-24 LAB — CYTOLOGY - PAP

## 2015-09-24 NOTE — Progress Notes (Signed)
   Subjective:    Patient ID: Laura NorthLori Hilmes, female    DOB: June 30, 1955, 60 y.o.   MRN: [id number]  HPI Pt presents for 5th shockwave therapy treatment, she states that she has had a small change in her pain   Review of Systems Discomfort right lateral achilles    Objective:   Physical Exam  Achilles right foot lateral  side, pain and tenderness      Assessment & Plan:  Shockwave therapy #5 administered to medial side of her right achilles using ESWR at 8 joules at 0.20 for 3000 pulses. EPAT administered to surrounding connective tissues. Re-appointed as need if symptoms do not improve.

## 2015-09-29 ENCOUNTER — Encounter: Payer: Self-pay | Admitting: Family Medicine

## 2015-09-29 ENCOUNTER — Ambulatory Visit (INDEPENDENT_AMBULATORY_CARE_PROVIDER_SITE_OTHER): Payer: BC Managed Care – PPO | Admitting: Family Medicine

## 2015-09-29 VITALS — BP 131/90 | HR 87 | Ht 67.0 in | Wt 265.0 lb

## 2015-09-29 DIAGNOSIS — M25562 Pain in left knee: Secondary | ICD-10-CM | POA: Diagnosis not present

## 2015-09-29 DIAGNOSIS — M25561 Pain in right knee: Secondary | ICD-10-CM | POA: Diagnosis not present

## 2015-09-29 MED ORDER — METHYLPREDNISOLONE ACETATE 40 MG/ML IJ SUSP
40.0000 mg | Freq: Once | INTRAMUSCULAR | Status: AC
  Administered 2015-09-29: 40 mg via INTRA_ARTICULAR

## 2015-09-30 NOTE — Progress Notes (Signed)
Patient ID: Laura [name], female   DOB: 07/30/1955, 59 y.o.   MRN: [id number]  Subjective:   PCP: Dr. White  Knee Pain   60 yo F here for bilateral knee pain  02/29/12: Patient has known history of mod-severe DJD of bilateral knees. Last cortisone injections 3 months ago - started wearing off recently and going on a trip to [place] tomorrow. Has h/o PEs, on anticoagulation with coumadin - last INR in past week was 2.0. Pain currently worse in right knee than left - more lateral than medial but worse medially on left knee.  09/10/13: Patient returns today for bilateral knee injections. Tried synvisc twice since last visit here. First set helped for 3 months - second one did not help. At least a few months relief with cortisone shots in past. No new injuries.  01/15/14: Patient reports her knees started bothering her a couple weeks ago. Did well with cortisone shots. Taking tylenol.  04/17/14: Patient returns for bilateral knee injections for DJD. Did well since last visit and just recently they started bothering her again. Left knee 6/10 pain, right 4/10. No catching, locking, giving out.  4/18: Patient returns for repeat injections - both knees 3/10 level. She's also interested in viscosupplementation - working on getting approval (supartz, our usual, is not approved by her insurance).  10/17: Patient returns for bilateral knee injections. Not much benefit last visit from cortisone injections - she did supartz by orthopedics and also not a whole lot of benefit. Constant dull ache in both knees 4/10 right, 6/10 left. Pain anterior, more medial. No catching, locking, giving out. Still bothered by her achilles on right as well - no new injuries. Doing some of the exercises. Has heel lifts. No skin changes, fever, other complaints.  04/08/15: Patient returns as pain started to recur past couple weeks in both knees. Pain level 0/10 at rest, up to 4/10 on left, 2/10 on  right especially with walking. Pain is sharp, anteromedial. Worse when getting up from seated position also. No skin changes, fever, other complaints.  4/26: Patient returns for bilateral knee injections. Pain level 5/10 bilaterally now. Worse with walking. No skin changes, numbness.  7/18: Patient returns with 3/10 pain in both knees. Worse first thing in morning, when getting up from prolonged sitting. Pain is dull. No skin changes, numbness. She would like bilateral knee injections.  Past Medical History  Diagnosis Date  . High blood pressure   . History of blood clots   . 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 Edema   . Herpes   . Obesity   . Vitamin D deficiency   . Partial tear of Achilles tendon   . Pulmonary embolism ([facility])     Current Outpatient Prescriptions on File Prior to Visit  Medication Sig Dispense Refill  . carvedilol (COREG) 6.25 MG tablet     . Cholecalciferol 5000 UNITS capsule Take 5,000 Units by mouth daily.    . hydrochlorothiazide (HYDRODIURIL) 25 MG tablet Take 25 mg by mouth daily.    . KLOR-CON M20 20 MEQ tablet     . nitroGLYCERIN (MINITRAN) 0.2 mg/hr patch Apply 1/4 patch to affected achilles, change daily 30 patch 1  . rivaroxaban (XARELTO) 20 MG TABS tablet Take 1 tablet (20 mg total) by mouth daily with supper. 30 tablet 11   No current facility-administered medications on file prior to visit.    Past Surgical History    Procedure Laterality Date  . Cesarean section      1993 and 1997    No Known Allergies  Social History   Social History  . Marital Status: Single    Spouse Name: N/A  . Number of Children: 2  . 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 Smoking status: Never Smoker   . Smokeless tobacco: Never Used  . Alcohol Use: 0.0 oz/week    0 Standard drinks or equivalent per week     Comment: Rarely.  . Drug Use: No  .  Sexual Activity: Not on file   Other Topics Concern  . Not on file   Social History Narrative    Family History  Problem Relation Age of Onset  . Hypertension Mother   . Heart failure Mother   . Prostate cancer Father   . Multiple myeloma Father   . Heart failure Father   . Heart attack Neg Hx   . Hyperlipidemia Neg Hx   . Diabetes Neg Hx   . Sudden death Neg Hx     BP 131/90 mmHg  Pulse 87  Ht 5' 7" (1.702 m)  Wt 265 lb (120.203 kg)  BMI 41.50 kg/m2  Review of Systems See HPI above.    Objective:   Physical Exam Gen: NAD  R knee: No effusion, ecchymoses, other deformity.  2+ crepitation. TTP medial joint line.   FROM  L knee: No effusion, ecchymoses, other deformity.  2+ crepitation. TTP medial joint line. FROM    Assessment & Plan:  1. Bilateral knee pain - 2/2 DJD.  Went ahead with repeat cortisone injections today.  Did well up to a couple weeks ago.  Again discussed can do these every 3 months if needed, would consider repeating viscosupplementation as well - did not get much relief trying this once.  Consider ortho referral too.  After informed written consent, patient was seated on exam table. Right knee was prepped with alcohol swab and utilizing anterolateral approach, patient's right knee was injected intraarticularly with 3:1 marcaine: depomedrol. Patient tolerated the procedure well without immediate complications.  After informed written consent, patient was seated on exam table. Left knee was prepped with alcohol swab and utilizing anterolateral approach, patient's left knee was injected intraarticularly with 3:1 marcaine: depomedrol. Patient tolerated the procedure well without immediate complications.

## 2015-09-30 NOTE — Assessment & Plan Note (Signed)
2/2 DJD.  Went ahead with repeat cortisone injections today.  Did well up to a couple weeks ago.  Again discussed can do these every 3 months if needed, would consider repeating viscosupplementation as well - did not get much relief trying this once.  Consider ortho referral too.  After informed written consent, patient was seated on exam table. Right knee was prepped with alcohol swab and utilizing anterolateral approach, patient's right knee was injected intraarticularly with 3:1 marcaine: depomedrol. Patient tolerated the procedure well without immediate complications.  After informed written consent, patient was seated on exam table. Left knee was prepped with alcohol swab and utilizing anterolateral approach, patient's left knee was injected intraarticularly with 3:1 marcaine: depomedrol. Patient tolerated the procedure well without immediate complications.

## 2015-10-21 ENCOUNTER — Ambulatory Visit (INDEPENDENT_AMBULATORY_CARE_PROVIDER_SITE_OTHER): Payer: BC Managed Care – PPO | Admitting: Sports Medicine

## 2015-10-21 ENCOUNTER — Encounter: Payer: Self-pay | Admitting: Sports Medicine

## 2015-10-21 DIAGNOSIS — M7661 Achilles tendinitis, right leg: Secondary | ICD-10-CM | POA: Diagnosis not present

## 2015-10-21 NOTE — Assessment & Plan Note (Signed)
This is starting to improve The tendon has actually changed more on ultrasound than I expected I advised that we increase her nitroglycerin to one half patch I think the degree of obesity and swelling may diminish some of the absorption  She should continue done some concentric heel raises using both feet I don't think she can do them on 1 foot  I cautioned her that I think this will take 9-12 months to resolve  I would like to repeat the scan in 2 months

## 2015-10-21 NOTE — Progress Notes (Signed)
  CC: Patient returns in followup of Achilles tendon pain right   Patient with chronic right Achilles pain Treatment with shock wave lithotripsy at podiatry did not help We saw her 6 weeks ago and she tried topical nitroglycerin She feels that the right Achilles hurts about 25% less She still has pain with walking Compression sleeve does help when she stands too long or walks too much  She has done a few of the exercises without much pain On her last visit the tendon showed a partial tear in thickening of the insertion to greater than 1 cm  Past history Bilateral significant osteoarthritis of the knees Injection by Dr. Pearletha ForgeHudnall recently helped a great deal Morbid obesity  Review of systems Persistent ankle and lower leg swelling in both extremities No numbness No sciatica  Physical exam Pleasant, obese female in no acute distress BP 128/85   Pulse 71   Ht 5\' 6"  (1.676 m)   Wt 275 lb (124.7 kg)   BMI 44.39 kg/m   Ankle ligaments ate stable Edema and puffiness around RT ankle More swelling laterally that goes into the peroneal sheaths Only Mild TTP and this is primarily midline to lateral over [facility] with limp 2/2 pain  US AT is down from 1.12cm to 0.9 cm at 1 cm prox to calcaneus Partial tear at insertion shows less hypoechoic change Transverse is still increased at 2.65 cms Overall hypoechoic change on trans scan is less Doppler flow is slt increased

## 2015-12-14 ENCOUNTER — Other Ambulatory Visit: Payer: Self-pay | Admitting: Family Medicine

## 2015-12-14 DIAGNOSIS — N6001 Solitary cyst of right breast: Secondary | ICD-10-CM

## 2015-12-16 ENCOUNTER — Ambulatory Visit: Payer: BC Managed Care – PPO | Admitting: Sports Medicine

## 2015-12-22 ENCOUNTER — Ambulatory Visit: Payer: BC Managed Care – PPO | Admitting: Sports Medicine

## 2015-12-25 ENCOUNTER — Ambulatory Visit (INDEPENDENT_AMBULATORY_CARE_PROVIDER_SITE_OTHER): Payer: BC Managed Care – PPO | Admitting: Family Medicine

## 2015-12-25 ENCOUNTER — Encounter: Payer: Self-pay | Admitting: Family Medicine

## 2015-12-25 ENCOUNTER — Encounter (INDEPENDENT_AMBULATORY_CARE_PROVIDER_SITE_OTHER): Payer: Self-pay

## 2015-12-25 VITALS — BP 121/88 | HR 86 | Ht 67.0 in | Wt 265.0 lb

## 2015-12-25 DIAGNOSIS — M25562 Pain in left knee: Secondary | ICD-10-CM | POA: Diagnosis not present

## 2015-12-25 DIAGNOSIS — M25561 Pain in right knee: Secondary | ICD-10-CM

## 2015-12-25 MED ORDER — METHYLPREDNISOLONE ACETATE 40 MG/ML IJ SUSP
40.0000 mg | Freq: Once | INTRAMUSCULAR | Status: AC
  Administered 2015-12-25: 40 mg via INTRA_ARTICULAR

## 2015-12-25 NOTE — Patient Instructions (Signed)
You were given cortisone injections today. We will get approval for the gel shots. Call us when the cortisone has worn off and we will go ahead with the gel shots next.

## 2015-12-30 NOTE — Assessment & Plan Note (Signed)
2/2 DJD.  Went ahead with repeat cortisone injections today.  Will get approval for viscosupplementation and do this next - advised to call us when cortisone wears off and we will go ahead.  Tylenol, nsaids, glucosamine, topical medications as needed.  After informed written consent, patient was seated on exam table. Right knee was prepped with alcohol swab and utilizing anterolateral approach, patient's right knee was injected intraarticularly with 3:1 marcaine: depomedrol. Patient tolerated the procedure well without immediate complications.  After informed written consent, patient was seated on exam table. Left knee was prepped with alcohol swab and utilizing anterolateral approach, patient's left knee was injected intraarticularly with 3:1 marcaine: depomedrol. Patient tolerated the procedure well without immediate complications.

## 2015-12-30 NOTE — Progress Notes (Signed)
Patient ID: Laura [name], female   DOB: 04-May-1955, 60 y.o.   MRN: [id number]  Subjective:   PCP: Dr. Dema Severin  Knee Pain    60 yo F here for bilateral knee pain  02/29/12: Patient has known history of mod-severe DJD of bilateral knees. Last cortisone injections 3 months ago - started wearing off recently and going on a trip to [place] Has h/o PEs, on anticoagulation with coumadin - last INR in past week was 2.0. Pain currently worse in right knee than left - more lateral than medial but worse medially on left knee.  09/10/13: Patient returns today for bilateral knee injections. Tried synvisc twice since last visit here. First set helped for 3 months - second one did not help. At least a few months relief with cortisone shots in past. No new injuries.  01/15/14: Patient reports her knees started bothering her a couple weeks ago. Did well with cortisone shots. Taking tylenol.  04/17/14: Patient returns for bilateral knee injections for DJD. Did well since last visit and just recently they started bothering her again. Left knee 6/10 pain, right 4/10. No catching, locking, giving out.  4/18: Patient returns for repeat injections - both knees 3/10 level. She's also interested in viscosupplementation - working on getting approval Jacklyn Shell, our usual, is not approved by her insurance).  10/17: Patient returns for bilateral knee injections. Not much benefit last visit from cortisone injections - she did supartz by orthopedics and also not a whole lot of benefit. Constant dull ache in both knees 4/10 right, 6/10 left. Pain anterior, more medial. No catching, locking, giving out. Still bothered by her achilles on right as well - no new injuries. Doing some of the exercises. Has heel lifts. No skin changes, fever, other complaints.  04/08/15: Patient returns as pain started to recur past couple weeks in both knees. Pain level 0/10 at rest, up to 4/10 on left, 2/10 on  right especially with walking. Pain is sharp, anteromedial. Worse when getting up from seated position also. No skin changes, fever, other complaints.  4/26: Patient returns for bilateral knee injections. Pain level 5/10 bilaterally now. Worse with walking. No skin changes, numbness.  7/18: Patient returns with 3/10 pain in both knees. Worse first thing in morning, when getting up from prolonged sitting. Pain is dull. No skin changes, numbness. She would like bilateral knee injections.  10/13: Patient reports her knee pain has worsened. Pain right knee 4/10, left 6/10 anterior, dull. Worse with walking. No pain at rest. Would like to try viscosupplementation if cortisone injections don't help as much this time. No skin changes, numbness.  Past Medical History:  Diagnosis Date  . Edema   . Herpes   . High blood pressure   . History of blood clots   . Obesity   . Partial tear of Achilles tendon   . Pulmonary embolism ([place])   . 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 Vitamin D deficiency     Current Outpatient Prescriptions on File Prior to Visit  Medication Sig Dispense Refill  . BELVIQ 10 MG TABS     . carvedilol (COREG) 6.25 MG tablet     . Cholecalciferol 5000 UNITS capsule Take 5,000 Units by mouth daily.    . hydrochlorothiazide (HYDRODIURIL) 25 MG tablet Take 25 mg by mouth daily.    Marland Kitchen KLOR-CON M20 20 MEQ tablet     . nitroGLYCERIN (MINITRAN) 0.2 mg/hr  patch Apply 1/4 patch to affected achilles, change daily 30 patch 1  . rivaroxaban (XARELTO) 20 MG TABS tablet Take 1 tablet (20 mg total) by mouth daily with supper. 30 tablet 11  . valACYclovir (VALTREX) 500 MG tablet      No current facility-administered medications on file prior to visit.     Past Surgical History:  Procedure Laterality Date  . [place] and 1997    No Known Allergies  Social History   Social History  . Marital status:  Single    Spouse name: N/A  . Number of children: 2  . Years of education: N/A   Occupational History  .  [employer]   Social History Main Topics  . Smoking status: Never Smoker  . Smokeless tobacco: Never Used  . Alcohol use 0.0 oz/week     Comment: Rarely.  . Drug use: No  . Sexual activity: Not on file   Other Topics Concern  . Not on file   Social History Narrative  . No narrative on file    Family History  Problem Relation Age of Onset  . Hypertension Mother   . Heart failure Mother   . Prostate cancer Father   . Multiple myeloma Father   . Heart failure Father   . Heart attack Neg Hx   . Hyperlipidemia Neg Hx   . Diabetes Neg Hx   . Sudden death Neg Hx     BP 121/88   Pulse 86   Ht 5' 7"  (1.702 m)   Wt 265 lb (120.2 kg)   BMI 41.50 kg/m   Review of Systems See HPI above.    Objective:   Physical Exam Gen: NAD  R knee: No effusion, ecchymoses, other deformity.  Mild TTP medial and lateral joint lines. FROM Negative valgus/varus stress.  Negative ant drawer, post drawer, lachmanns. Negative mcmurrays, apleys, patellar apprehension.  L knee: No effusion, ecchymoses, other deformity. TTP medial, lateral joint lines. FROM Negative valgus/varus stress.  Negative ant drawer, post drawer, lachmanns. Negative mcmurrays, apleys, patellar apprehension.    Assessment & Plan:  1. Bilateral knee pain - 2/2 DJD.  Went ahead with repeat cortisone injections today.  Will get approval for viscosupplementation and do this next - advised to call us when cortisone wears off and we will go ahead.  Tylenol, nsaids, glucosamine, topical medications as needed.  After informed written consent, patient was seated on exam table. Right knee was prepped with alcohol swab and utilizing anterolateral approach, patient's right knee was injected intraarticularly with 3:1 marcaine: depomedrol. Patient tolerated the procedure well without immediate complications.  After informed  written consent, patient was seated on exam table. Left knee was prepped with alcohol swab and utilizing anterolateral approach, patient's left knee was injected intraarticularly with 3:1 marcaine: depomedrol. Patient tolerated the procedure well without immediate complications.

## 2016-01-05 ENCOUNTER — Ambulatory Visit (INDEPENDENT_AMBULATORY_CARE_PROVIDER_SITE_OTHER): Payer: BC Managed Care – PPO | Admitting: Sports Medicine

## 2016-01-05 ENCOUNTER — Ambulatory Visit
Admission: RE | Admit: 2016-01-05 | Discharge: 2016-01-05 | Disposition: A | Discharge status: Home / Self Care | Payer: BC Managed Care – PPO | Source: Ambulatory Visit | Attending: Family Medicine | Admitting: Family Medicine

## 2016-01-05 ENCOUNTER — Ambulatory Visit: Payer: Self-pay

## 2016-01-05 ENCOUNTER — Encounter: Payer: Self-pay | Admitting: Sports Medicine

## 2016-01-05 VITALS — BP 122/81 | HR 75 | Ht 67.0 in | Wt 265.0 lb

## 2016-01-05 DIAGNOSIS — M7661 Achilles tendinitis, right leg: Secondary | ICD-10-CM

## 2016-01-05 DIAGNOSIS — N6001 Solitary cyst of right breast: Secondary | ICD-10-CM

## 2016-01-05 NOTE — Assessment & Plan Note (Signed)
Significant improvement  Cont the HEP Compression sleeve intermittent use for prolonged standing May stop the NTG but we can restart if pain returns  Wear good shoe support  Repeat eval in 3 mos with [place]

## 2016-01-05 NOTE — Progress Notes (Signed)
CC: RT Achilles tendinopathy  Patient feels that she has less pain Walking more easily NTG started to cause a rash so has been off x 1 week Able to do home exercises most days Uses compression sleeve most days Less swelling noted by patient in last month Previous US - mod large insertional tear and marked thickening that improved at 6 wks  ROS Some generalized foot and ankle swelling No pain in left AT  Pexam Obese, pleasant F in NAD BP 122/81   Pulse 75   Ht 5\' 7"  (1.702 m)   Wt 265 lb (120.2 kg)   BMI 41.50 kg/m   RT Full motion at ankle with no pain Swelling is less over AT No real TTP at AT Walking gait with minimal limp  US of RT AT The hypoechoic change at insertion is 90% resolved AT thickness on AP view is now 0.79 compared to 1.3 at start of Tx No abnormal doppler flow Transverse diameter now 2.09 and was 2.64  Impression:  Marked improvement in appearance of partial tear and chronic thickening of right Achilles Tendon  Scan and interpretation by Enid BaasKarl Cale Decarolis, MD

## 2016-03-15 ENCOUNTER — Telehealth: Payer: Self-pay | Admitting: *Deleted

## 2016-03-15 NOTE — Telephone Encounter (Signed)
Dr Cyndie ChimeGranfortuna needs current CBC/renal fcn prior to [facility] [place]. Called pt - no answer; left message to schedule lab appt. Dr Patsy LagerGranfortuna's response about holding Xarelto faxed to them @ [phone].

## 2016-03-16 ENCOUNTER — Other Ambulatory Visit: Payer: Self-pay | Admitting: Oncology

## 2016-03-16 DIAGNOSIS — I2782 Chronic pulmonary embolism: Secondary | ICD-10-CM

## 2016-03-16 DIAGNOSIS — D689 Coagulation defect, unspecified: Secondary | ICD-10-CM

## 2016-03-16 DIAGNOSIS — Z7901 Long term (current) use of anticoagulants: Secondary | ICD-10-CM

## 2016-03-16 NOTE — Telephone Encounter (Signed)
Called pt - stated she can come next Thursday the 11th at 1545 PM.

## 2016-03-16 NOTE — Telephone Encounter (Signed)
OK I put in orders 

## 2016-03-24 ENCOUNTER — Ambulatory Visit (INDEPENDENT_AMBULATORY_CARE_PROVIDER_SITE_OTHER): Payer: BC Managed Care – PPO | Admitting: Sports Medicine

## 2016-03-24 ENCOUNTER — Other Ambulatory Visit: Payer: BC Managed Care – PPO

## 2016-03-24 ENCOUNTER — Encounter: Payer: Self-pay | Admitting: Sports Medicine

## 2016-03-24 DIAGNOSIS — M174 Other bilateral secondary osteoarthritis of knee: Secondary | ICD-10-CM

## 2016-03-24 NOTE — Assessment & Plan Note (Signed)
She has grade 4 DJD per history  Failing conservative care but higher risk for TKR  Trial with knee brace to stabiize the left  Consider beginning PT and pilates for prehab

## 2016-03-24 NOTE — Progress Notes (Signed)
  Laura [name] is a 61 y.o. female who is here for L knee pain.     HPI:  Laura [name] reports that she has had chronic bilateral knee pain for years, but has developed new and different pain in her L knee. Says this new pain started at Thanksgiving described as throbbing on either side of knee. Also, her knee now feels unstable with walking. Increased stiffness and tightness after prolonged sitting. Unsure if it is swollen. Pain is worst in the morning and also with going up stairs. Feels like it is easier to move after being up and around for 5-7210minutes.  No new injuries. Has been seeing orthopedic surgeon for joint injections; last Synvisc injections of both knees on 22DEC2017. Injection helped R knee, but not the left (was a series of 3 injections). Prior to that, she had steroid injections which she says were not helpful. Last xray in Dec2017. Says that orthopedic surgeon wants her to lose 40lbs before knee replacement. Stable weight in last 6 months. No new health issues.   Meds: Tylenol, xarelto (can't take NSAIDs, but know these work, occasionally will not take blood thinner so she can take an NSAID for knee pain)  Med hx: HTN, hx of multiple PE, chronic arthritis  All: none  Physical Exam:  BP 122/84   Ht 5\' 7"  (1.702 m)   Wt 275 lb (124.7 kg)   BMI 43.07 kg/m  Gen: obese, resting on exam table, but has difficulties with lifting legs onto exam table L knee: Large effusion. Decreased ROM with pain (flexion to 100 degrees on L, 120 on R; extension -10degrees on L and R). TTP on medial and lateral joint line. No pain with valgus or varus stress. Negative ant/post drawer test. Difficulties walking, but reports decreased feelings of instability with new knee brace.  Assessment/Plan: 61yr old female with hx of obesity, recurrent PE, and chronic knee arthritis is here for worsening L knee pain and stiffness since NOV2017. No significant improvement in symptoms since last Synvisc. Symptoms are  most likely due to increasing effusion and irritation due to chronic arthritis, rather than new knee pathology. No indication of new ligamentous damage. -Fitted with new L knee brace to help provide stability and reduce effusion. Should wear brace during the day, especially if walking, and then apply ice and elevate leg at night after removing -Encouraged her weight loss efforts  -Discussed possible physical therapy/pilates to improve strength and mobility prior to surgery.    Laura GreeningPaige Shakayla Hickox, MD  03/24/16   I observed and examined the patient with the resident and agree with assessment and plan.  Note reviewed and modified by me. Enid BaasKarl Fields, MD

## 2016-03-25 ENCOUNTER — Other Ambulatory Visit (INDEPENDENT_AMBULATORY_CARE_PROVIDER_SITE_OTHER): Payer: BC Managed Care – PPO

## 2016-03-25 DIAGNOSIS — D689 Coagulation defect, unspecified: Secondary | ICD-10-CM | POA: Diagnosis not present

## 2016-03-25 DIAGNOSIS — I2782 Chronic pulmonary embolism: Secondary | ICD-10-CM

## 2016-03-25 DIAGNOSIS — Z7901 Long term (current) use of anticoagulants: Secondary | ICD-10-CM | POA: Diagnosis not present

## 2016-03-26 LAB — CBC WITH DIFFERENTIAL/PLATELET
Basophils Absolute: 0 10*3/uL (ref 0.0–0.2)
Basos: 0 %
EOS (ABSOLUTE): 0.4 10*3/uL (ref 0.0–0.4)
EOS: 4 %
HEMOGLOBIN: 11.7 g/dL (ref 11.1–15.9)
Hematocrit: 37.5 % (ref 34.0–46.6)
IMMATURE GRANS (ABS): 0 10*3/uL (ref 0.0–0.1)
IMMATURE GRANULOCYTES: 0 %
LYMPHS: 34 %
Lymphocytes Absolute: 3.2 10*3/uL — ABNORMAL HIGH (ref 0.7–3.1)
MCH: 27.9 pg (ref 26.6–33.0)
MCHC: 31.2 g/dL — ABNORMAL LOW (ref 31.5–35.7)
MCV: 90 fL (ref 79–97)
MONOCYTES: 10 %
MONOS ABS: 1 10*3/uL — AB (ref 0.1–0.9)
NEUTROS PCT: 52 %
Neutrophils Absolute: 4.9 10*3/uL (ref 1.4–7.0)
Platelets: 321 10*3/uL (ref 150–379)
RBC: 4.19 x10E6/uL (ref 3.77–5.28)
RDW: 14.3 % (ref 12.3–15.4)
WBC: 9.6 10*3/uL (ref 3.4–10.8)

## 2016-03-26 LAB — COMPREHENSIVE METABOLIC PANEL
ALT: 12 IU/L (ref 0–32)
AST: 16 IU/L (ref 0–40)
Albumin/Globulin Ratio: 0.9 — ABNORMAL LOW (ref 1.2–2.2)
Albumin: 3.7 g/dL (ref 3.5–5.5)
Alkaline Phosphatase: 66 IU/L (ref 39–117)
BUN/Creatinine Ratio: 17 (ref 9–23)
BUN: 15 mg/dL (ref 6–24)
Bilirubin Total: 0.4 mg/dL (ref 0.0–1.2)
CALCIUM: 9.4 mg/dL (ref 8.7–10.2)
CO2: 28 mmol/L (ref 18–29)
CREATININE: 0.89 mg/dL (ref 0.57–1.00)
Chloride: 98 mmol/L (ref 96–106)
GFR calc Af Amer: 83 mL/min/{1.73_m2} (ref >59)
GFR, EST NON AFRICAN AMERICAN: 72 mL/min/{1.73_m2} (ref >59)
GLOBULIN, TOTAL: 3.9 g/dL (ref 1.5–4.5)
Glucose: 78 mg/dL (ref 65–99)
Potassium: 4 mmol/L (ref 3.5–5.2)
SODIUM: 142 mmol/L (ref 134–144)
Total Protein: 7.6 g/dL (ref 6.0–8.5)

## 2016-03-26 LAB — SEDIMENTATION RATE: Sed Rate: 83 mm/hr — ABNORMAL HIGH (ref 0–40)

## 2016-03-29 ENCOUNTER — Telehealth: Payer: Self-pay | Admitting: *Deleted

## 2016-03-29 NOTE — Telephone Encounter (Signed)
Pt called - no answer; left message " lab good. Slightly decreased hemoglobin compared with previous. Will discuss at time of visit next month" per Dr Cyndie ChimeGranfortuna. And to call for any questions.

## 2016-03-29 NOTE — Telephone Encounter (Signed)
-----   Message from Levert FeinsteinJames M Granfortuna, MD sent at 03/29/2016  8:47 AM EST ----- Call: lab good. Slightly decreased hemoglobin compared with previous and signs of non-specific inflammation. Will discuss at time of visit next month

## 2016-04-12 ENCOUNTER — Ambulatory Visit: Payer: BC Managed Care – PPO | Admitting: Sports Medicine

## 2016-04-25 ENCOUNTER — Other Ambulatory Visit: Payer: Self-pay | Admitting: Oncology

## 2016-04-25 ENCOUNTER — Encounter: Payer: Self-pay | Admitting: Oncology

## 2016-04-25 ENCOUNTER — Ambulatory Visit (INDEPENDENT_AMBULATORY_CARE_PROVIDER_SITE_OTHER): Payer: BC Managed Care – PPO | Admitting: Oncology

## 2016-04-25 VITALS — BP 117/79 | HR 82 | Temp 98.2°F | Ht 66.5 in

## 2016-04-25 DIAGNOSIS — Z7901 Long term (current) use of anticoagulants: Secondary | ICD-10-CM | POA: Diagnosis not present

## 2016-04-25 DIAGNOSIS — Z86010 Personal history of colonic polyps: Secondary | ICD-10-CM

## 2016-04-25 DIAGNOSIS — D649 Anemia, unspecified: Secondary | ICD-10-CM | POA: Diagnosis not present

## 2016-04-25 DIAGNOSIS — R768 Other specified abnormal immunological findings in serum: Secondary | ICD-10-CM

## 2016-04-25 DIAGNOSIS — Z86711 Personal history of pulmonary embolism: Secondary | ICD-10-CM

## 2016-04-25 DIAGNOSIS — I2699 Other pulmonary embolism without acute cor pulmonale: Secondary | ICD-10-CM

## 2016-04-25 DIAGNOSIS — M170 Bilateral primary osteoarthritis of knee: Secondary | ICD-10-CM | POA: Diagnosis not present

## 2016-04-25 DIAGNOSIS — D688 Other specified coagulation defects: Secondary | ICD-10-CM | POA: Diagnosis not present

## 2016-04-25 DIAGNOSIS — D689 Coagulation defect, unspecified: Secondary | ICD-10-CM

## 2016-04-25 MED ORDER — RIVAROXABAN 20 MG PO TABS: 20.0000 mg | ORAL_TABLET | Freq: Every day | ORAL | 11 refills | Status: DC

## 2016-04-25 NOTE — Patient Instructions (Signed)
To lab today Return visit 1 year Lab 1 week before

## 2016-04-25 NOTE — Progress Notes (Signed)
Hematology and Oncology Follow Up Visit  Laura [name] [id number] 1955/08/21 61 y.o. 04/25/2016 9:03 AM   Principle Diagnosis: Encounter Diagnosis  Name Primary?  . 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 First event in November 2005. Recurrence in October 2007. She was first evaluated by me in July 2008. 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 There is epidemiologic evidence that this is a risk factor for clotting with the highest prevalence in African-American and Scandinavian populations. She has been on full dose she was on chronic coumadin anticoagulation until 2017 when she transitioned to Xarelto. She has had no subsequent thrombotic events. Additional testing showednormal antithrombin III level, negative for the factor V Leiden and prothrombin gene mutations, Negative antiphospholipid antibodies and beta 2 glycoprotein 1 antibodies, negative ANA, hepatitis C antibody negative, protein electrophoresis with a mild polyclonal elevation of both IgG and IgA at 2120 mg percent and 441 mg percent respectively. She had a mild normochromic anemia with borderline decrease ferritin of 13. She was put on iron and her hemoglobin started to come up.  She has chronic degenerative arthritis of her knees. She has required intermittent steroid injections.  Interim History:   She just had her first colonoscopy 3 weeks ago by a gastroenterologist in [place]. A small benign polyp was removed. Study otherwise normal. She had mammograms on 04/09/2016. There was a suspicious area in her right breast. She was called back for an ultrasound. Reports still not in the chart but reported to the patient as negative. She's had a Pap smear about a year ago. Her arthritis is getting worse. She is going to have a left knee replacement in May of this year by Dr. Wynelle Link and if all goes well,  she will subsequently get her right knee done as well as. She has had no cardiorespiratory complaints and denies any dyspnea, chest pain, or palpitations. No calf swelling or tenderness.  Her family is doing well. Her daughter is now 5 and in college. Her son is 48. She is still working as a Social worker at [employer].  Medications: reviewed  Allergies: No Known Allergies  Review of Systems: See interim history  Remaining ROS negative:   Physical Exam: Blood pressure 117/79, pulse 82, temperature 98.2 F (36.8 C), temperature source Oral, height 5' 6.5" (1.689 m), SpO2 96 %. Wt Readings from Last 3 Encounters:  03/24/16 275 lb (124.7 kg)  01/05/16 265 lb (120.2 kg)  12/25/15 265 lb (120.2 kg)     General appearance: Morbidly obese African-American woman Laura [name]: Pharynx no erythema, exudate, mass, or ulcer. No thyromegaly or thyroid nodules Lymph nodes: No cervical, supraclavicular, or axillary lymphadenopathy Breasts: Lungs: Clear to auscultation, resonant to percussion throughout Heart: Regular rhythm, no murmur, no gallop, no rub, no click, no edema Abdomen: Soft, nontender, normal bowel sounds, no mass, no organomegaly Extremities: No edema, no calf tenderness Musculoskeletal: no joint deformities but difficulty walking due to advanced arthritis GU:  Vascular: Carotid pulses 2+, no bruits, distal pulses: Dorsalis pedis 1+ symmetric; optic discs sharp: Unable to visualize suspect cataract on the right. cranial nerves grossly normal, motor strength 5 over 5, reflexes 1+ symmetric at the biceps, absent symmetric at the knees, upper body coordination normal, gait unsteady due to advanced degenerative arthritis of the knees Skin: No rash or ecchymosis  Lab Results: CBC W/Diff    Component Value Date/Time   WBC 9.6 03/25/2016 1435  WBC 7.5 05/13/2014 1158   RBC 4.19 03/25/2016 1435   RBC 4.47 05/13/2014 1158   HGB 12.8 05/13/2014 1158   HGB 12.8 05/29/2012 1502   HCT 37.5  03/25/2016 1435   HCT 40.0 05/29/2012 1502   PLT 321 03/25/2016 1435   MCV 90 03/25/2016 1435   MCV 90.3 05/29/2012 1502   MCH 27.9 03/25/2016 1435   MCH 28.6 05/13/2014 1158   MCHC 31.2 (L) 03/25/2016 1435   MCHC 31.8 05/13/2014 1158   RDW 14.3 03/25/2016 1435   RDW 14.9 (H) 05/29/2012 1502   LYMPHSABS 3.2 (H) 03/25/2016 1435   LYMPHSABS 2.9 05/29/2012 1502   MONOABS 0.7 05/13/2014 1158   MONOABS 0.7 05/29/2012 1502   EOSABS 0.4 03/25/2016 1435   BASOSABS 0.0 03/25/2016 1435   BASOSABS 0.0 05/29/2012 1502     Chemistry      Component Value Date/Time   NA 142 03/25/2016 1435   K 4.0 03/25/2016 1435   CL 98 03/25/2016 1435   CO2 28 03/25/2016 1435   BUN 15 03/25/2016 1435   CREATININE 0.89 03/25/2016 1435   CREATININE 0.83 05/13/2014 1158      Component Value Date/Time   CALCIUM 9.4 03/25/2016 1435   ALKPHOS 66 03/25/2016 1435   AST 16 03/25/2016 1435   ALT 12 03/25/2016 1435   BILITOT 0.4 03/25/2016 1435       Radiological Studies: No results found.  Impression:  #1. 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 History of unprovoked pulmonary emboli secondary to #1.  #3. Chronic anticoagulation in view of #1 and #2. She did decide to transition from warfarin to Xarelto and is currently on 20 mg daily.  #4. Chronic normochromic anemia Hemoglobin has been steady at about 13 g until value done in anticipation of today's visit on 03/25/2016. Hemoglobin now 11.7 with MCV 90. Borderline elevation of white count 9600 and platelets 321,000 suggesting a chronic inflammatory process. However, in view of the family history of multiple myeloma in her mother, I will repeat a CBC today and do a follow-up myeloma screen with quantitative immunoglobulins and immunofixation electrophoresis.  #5. Advanced degenerative arthritis of the knees Now becoming incapacitating. She anticipates surgery this may. She has normal renal function with a BUN 15 creatinine 0.9. She will  only need to hold her Xarelto for 24 hours and then resume 12-24 hours postop.  CC: Patient Care Team: Harlan Stains, MD as PCP - General (Family Medicine) Norman Herrlich, MD as Resident (Internal Medicine)   Annia Belt, MD 2/12/20189:03 AM

## 2016-04-27 LAB — CBC WITH DIFFERENTIAL/PLATELET
BASOS: 0 %
Basophils Absolute: 0 10*3/uL (ref 0.0–0.2)
EOS (ABSOLUTE): 0.4 10*3/uL (ref 0.0–0.4)
EOS: 5 %
HEMATOCRIT: 38 % (ref 34.0–46.6)
HEMOGLOBIN: 12.2 g/dL (ref 11.1–15.9)
Immature Grans (Abs): 0 10*3/uL (ref 0.0–0.1)
Immature Granulocytes: 0 %
LYMPHS ABS: 2.4 10*3/uL (ref 0.7–3.1)
Lymphs: 33 %
MCH: 28.4 pg (ref 26.6–33.0)
MCHC: 32.1 g/dL (ref 31.5–35.7)
MCV: 89 fL (ref 79–97)
MONOCYTES: 11 %
Monocytes Absolute: 0.8 10*3/uL (ref 0.1–0.9)
Neutrophils Absolute: 3.7 10*3/uL (ref 1.4–7.0)
Neutrophils: 51 %
Platelets: 311 10*3/uL (ref 150–379)
RBC: 4.29 x10E6/uL (ref 3.77–5.28)
RDW: 14.4 % (ref 12.3–15.4)
WBC: 7.2 10*3/uL (ref 3.4–10.8)

## 2016-04-27 LAB — IMMUNOFIXATION ELECTROPHORESIS
IGG (IMMUNOGLOBIN G), SERUM: 1919 mg/dL — AB (ref 700–1600)
IGM (IMMUNOGLOBULIN M), SRM: 77 mg/dL (ref 26–217)
IgA/Immunoglobulin A, Serum: 331 mg/dL (ref 87–352)
Total Protein: 7.7 g/dL (ref 6.0–8.5)

## 2016-04-28 ENCOUNTER — Telehealth: Payer: Self-pay | Admitting: *Deleted

## 2016-04-28 NOTE — Telephone Encounter (Signed)
-----  Message from Annia Belt, MD sent at 04/27/2016  4:48 PM EST ----- Call pt: mild elevation of antibody levels in a non specific inflammatory pattern; no suspicion of multiple myeloma

## 2016-04-28 NOTE — Telephone Encounter (Signed)
Pt called - no answer; left message "mild elevation of antibody levels in a non specific inflammatory pattern (IgG level is 1919); no suspicion of multiple myeloma" per Dr Beryle Beams. And call fro any questions.

## 2016-06-03 ENCOUNTER — Telehealth: Payer: Self-pay | Admitting: *Deleted

## 2016-06-03 NOTE — Telephone Encounter (Signed)
-----   Message from Levert FeinsteinJames M Granfortuna, MD sent at 06/03/2016 10:26 AM EDT ----- Patient to have knee surgery on 09/05/16. I just saw her on 04/25/16. Please advise her not to take her Xarelto the day before surgery then resume morning after procedure. thx DrG

## 2016-06-03 NOTE — Telephone Encounter (Signed)
Called pt - no answer; left message to give me a call back. 

## 2016-06-07 NOTE — Telephone Encounter (Signed)
Mailbox full - home phone. Called work # - informed "her not to take her Xarelto the day before surgery then resume morning after procedure." per Dr Cyndie ChimeGranfortuna. Pt repeated instructions; voiced understanding.

## 2016-07-15 ENCOUNTER — Encounter: Payer: Self-pay | Admitting: Family Medicine

## 2016-07-15 ENCOUNTER — Ambulatory Visit (INDEPENDENT_AMBULATORY_CARE_PROVIDER_SITE_OTHER): Payer: BC Managed Care – PPO | Admitting: Family Medicine

## 2016-07-15 VITALS — BP 129/89 | HR 83 | Ht 67.0 in | Wt 275.0 lb

## 2016-07-15 DIAGNOSIS — M25562 Pain in left knee: Secondary | ICD-10-CM | POA: Diagnosis not present

## 2016-07-15 DIAGNOSIS — M25561 Pain in right knee: Secondary | ICD-10-CM

## 2016-07-15 DIAGNOSIS — G8929 Other chronic pain: Secondary | ICD-10-CM

## 2016-07-15 MED ORDER — METHYLPREDNISOLONE ACETATE 40 MG/ML IJ SUSP
40.0000 mg | Freq: Once | INTRAMUSCULAR | Status: AC
  Administered 2016-07-15: 40 mg via INTRA_ARTICULAR

## 2016-07-15 NOTE — Patient Instructions (Signed)
You were given cortisone injections into both knees today. We will refer you to Dr. Magnus IvanBlackman to discuss knee replacement surgery.

## 2016-07-18 NOTE — Assessment & Plan Note (Signed)
2/2 DJD.  Repeated cortisone injections - can do so as long as helping at least 3 months.  Consider viscosupplementation in future.  Tylenol, nsaids, topical medications as needed.  After informed written consent, patient was seated on exam table. Right knee was prepped with alcohol swab and utilizing anterolateral approach, patient's right knee was injected intraarticularly with 3:1 bupivicaine: depomedrol. Patient tolerated the procedure well without immediate complications.  After informed written consent, patient was seated on exam table. Left knee was prepped with alcohol swab and utilizing anterolateral approach, patient's left knee was injected intraarticularly with 3:1 bupivicaine: depomedrol. Patient tolerated the procedure well without immediate complications.

## 2016-07-18 NOTE — Progress Notes (Signed)
Patient ID: Laura [name], female   DOB: [birth year], 61 y.o.   MRN: [id number]  Subjective:   PCP: Dr. Dema Severin  Knee Pain    61 yo F here for bilateral knee pain  02/29/12: Patient has known history of mod-severe DJD of bilateral knees. Last cortisone injections 3 months ago - started wearing off recently and going on a trip to [place] Has h/o PEs, on anticoagulation with coumadin - last INR in past week was 2.0. Pain currently worse in right knee than left - more lateral than medial but worse medially on left knee.  09/10/13: Patient returns today for bilateral knee injections. Tried synvisc twice since last visit here. First set helped for 3 months - second one did not help. At least a few months relief with cortisone shots in past. No new injuries.  01/15/14: Patient reports her knees started bothering her a couple weeks ago. Did well with cortisone shots. Taking tylenol.  04/17/14: Patient returns for bilateral knee injections for DJD. Did well since last visit and just recently they started bothering her again. Left knee 6/10 pain, right 4/10. No catching, locking, giving out.  4/18: Patient returns for repeat injections - both knees 3/10 level. She's also interested in viscosupplementation - working on getting approval Jacklyn Shell, our usual, is not approved by her insurance).  10/17: Patient returns for bilateral knee injections. Not much benefit last visit from cortisone injections - she did supartz by orthopedics and also not a whole lot of benefit. Constant dull ache in both knees 4/10 right, 6/10 left. Pain anterior, more medial. No catching, locking, giving out. Still bothered by her achilles on right as well - no new injuries. Doing some of the exercises. Has heel lifts. No skin changes, fever, other complaints.  04/08/15: Patient returns as pain started to recur past couple weeks in both knees. Pain level 0/10 at rest, up to 4/10 on left, 2/10 on  right especially with walking. Pain is sharp, anteromedial. Worse when getting up from seated position also. No skin changes, fever, other complaints.  4/26: Patient returns for bilateral knee injections. Pain level 5/10 bilaterally now. Worse with walking. No skin changes, numbness.  7/18: Patient returns with 3/10 pain in both knees. Worse first thing in morning, when getting up from prolonged sitting. Pain is dull. No skin changes, numbness. She would like bilateral knee injections.  12/25/15: Patient reports her knee pain has worsened. Pain right knee 4/10, left 6/10 anterior, dull. Worse with walking. No pain at rest. Would like to try viscosupplementation if cortisone injections don't help as much this time. No skin changes, numbness.  07/15/16: Patient returns with pain in both knees - up to 6/10 on left, 3/10 on right and sharp, anterior. Pain is 0/10 at rest bilaterally. Worse with ambulation. No skin changes, numbness.  Past Medical History:  Diagnosis Date  . Edema   . Herpes   . High blood pressure   . History of blood clots   . Obesity   . Partial tear of Achilles tendon   . Pulmonary embolism ([place])   . 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 Vitamin D deficiency     Current Outpatient Prescriptions on File Prior to Visit  Medication Sig Dispense Refill  . BELVIQ 10 MG TABS     . carvedilol (COREG) 6.25 MG tablet     . Cholecalciferol 5000 UNITS capsule Take 5,000 Units by mouth  daily.    . hydrochlorothiazide (HYDRODIURIL) 25 MG tablet Take 25 mg by mouth daily.    Marland Kitchen KLOR-CON M20 20 MEQ tablet     . nitroGLYCERIN (MINITRAN) 0.2 mg/hr patch Apply 1/4 patch to affected achilles, change daily 30 patch 1  . rivaroxaban (XARELTO) 20 MG TABS tablet Take 1 tablet (20 mg total) by mouth daily with supper. 30 tablet 11  . valACYclovir (VALTREX) 500 MG tablet      No current facility-administered medications on  file prior to visit.     Past Surgical History:  Procedure Laterality Date  . [place] and 1997    No Known Allergies  Social History   Social History  . Marital status: Single    Spouse name: N/A  . Number of children: 2  . Years of education: N/A   Occupational History  .  [employer]   Social History Main Topics  . Smoking status: Never Smoker  . Smokeless tobacco: Never Used  . Alcohol use 0.0 oz/week     Comment: Rarely.  . Drug use: No  . Sexual activity: Not on file   Other Topics Concern  . Not on file   Social History Narrative  . No narrative on file    Family History  Problem Relation Age of Onset  . Hypertension Mother   . Heart failure Mother   . Prostate cancer Father   . Multiple myeloma Father   . Heart failure Father   . Heart attack Neg Hx   . Hyperlipidemia Neg Hx   . Diabetes Neg Hx   . Sudden death Neg Hx     BP 129/89   Pulse 83   Ht _0  (1.702 m)   Wt 275 lb (124.7 kg)   BMI 43.07 kg/m   Review of Systems See HPI above.    Objective:   Physical Exam Gen: NAD  R knee: No effusion, ecchymoses, other deformity.  Mild TTP medial and lateral joint lines. FROM Negative valgus/varus stress.  Negative ant drawer, post drawer, lachmanns. Negative mcmurrays, apleys.  L knee: No effusion, ecchymoses, other deformity. Mild TTP medial, lateral joint lines. FROM Negative valgus/varus stress.  Negative ant drawer, post drawer, lachmanns. Negative mcmurrays, apleys.    Assessment & Plan:  1. Bilateral knee pain - 2/2 DJD.  Repeated cortisone injections - can do so as long as helping at least 3 months.  Consider viscosupplementation in future.  Tylenol, nsaids, topical medications as needed.  After informed written consent, patient was seated on exam table. Right knee was prepped with alcohol swab and utilizing anterolateral approach, patient's right knee was injected intraarticularly with 3:1 bupivicaine: depomedrol.  Patient tolerated the procedure well without immediate complications.  After informed written consent, patient was seated on exam table. Left knee was prepped with alcohol swab and utilizing anterolateral approach, patient's left knee was injected intraarticularly with 3:1 bupivicaine: depomedrol. Patient tolerated the procedure well without immediate complications.

## 2016-08-01 ENCOUNTER — Ambulatory Visit (INDEPENDENT_AMBULATORY_CARE_PROVIDER_SITE_OTHER): Payer: BC Managed Care – PPO | Admitting: Orthopaedic Surgery

## 2016-08-01 NOTE — Addendum Note (Signed)
Addended by: Kathi SimpersWISE, Chikita Dogan F on: 08/01/2016 01:24 PM   Modules accepted: Orders, SmartSet

## 2016-08-02 ENCOUNTER — Ambulatory Visit: Payer: Self-pay | Admitting: Orthopedic Surgery

## 2016-08-11 ENCOUNTER — Ambulatory Visit (INDEPENDENT_AMBULATORY_CARE_PROVIDER_SITE_OTHER): Payer: BC Managed Care – PPO | Admitting: Orthopaedic Surgery

## 2016-08-30 NOTE — Progress Notes (Signed)
08-22-16 Surgical clearance from Dr. Cliffton AstersWhite, as well as EKG and office note on chart 04-25-16 Surgical clearance from Dr. Cyndie ChimeGranfortuna on chart

## 2016-08-30 NOTE — Patient Instructions (Signed)
Eddie NorthLori Guagliardo  08/30/2016   Your procedure is scheduled on: 09-05-16  [facility]  Entrance Report to Admitting at 5:50 AM   Call this number if you have problems the morning of surgery [phone]   Remember: ONLY 1 PERSON MAY GO WITH YOU TO SHORT STAY TO GET  READY MORNING OF YOUR SURGERY.  Do not eat food or drink liquids :After Midnight.     Take these medicines the morning of surgery with A SIP OF WATER: Carvedilol (Coreg)                               You may not have any metal on your body including hair pins and              piercings  Do not wear jewelry, make-up, lotions, powders or perfumes, deodorant             Do not wear nail polish.  Do not shave  48 hours prior to surgery.               Do not bring valuables to the hospital. [facility] IS NOT             RESPONSIBLE   FOR VALUABLES.  Contacts, dentures or bridgework may not be worn into surgery.  Leave suitcase in the car. After surgery it may be brought to your room.                 Please read over the following fact sheets you were given: _____________________________________________________________________             [facility] Pre[facility] Before surgery, you can play an important role.  Because skin is not sterile, your skin needs to be as free of germs as possible.  You can reduce the number of germs on your skin by washing with CHG (chlorahexidine gluconate) soap before surgery.  CHG is an antiseptic cleaner which kills germs and bonds with the skin to continue killing germs even after washing. Please DO NOT use if you have an allergy to CHG or antibacterial soaps.  If your skin becomes reddened/irritated stop using the CHG and inform your nurse when you arrive at Short Stay. Do not shave (including legs and underarms) for at least 48 hours prior to the first CHG shower.  You may shave your face/neck. Please follow these instructions carefully:  1.   Shower with CHG Soap the night before surgery and the  morning of Surgery.  2.  If you choose to wash your hair, wash your hair first as usual with your  normal  shampoo.  3.  After you shampoo, rinse your hair and body thoroughly to remove the  shampoo.                           4.  Use CHG as you would any other liquid soap.  You can apply chg directly  to the skin and wash                       Gently with a scrungie or clean washcloth.  5.  Apply the CHG Soap to your body ONLY FROM THE NECK DOWN.   Do not use on face/ open  Wound or open sores. Avoid contact with eyes, ears mouth and genitals (private parts).                       Wash face,  Genitals (private parts) with your normal soap.             6.  Wash thoroughly, paying special attention to the area where your surgery  will be performed.  7.  Thoroughly rinse your body with warm water from the neck down.  8.  DO NOT shower/wash with your normal soap after using and rinsing off  the CHG Soap.                9.  Pat yourself dry with a clean towel.            10.  Wear clean pajamas.            11.  Place clean sheets on your bed the night of your first shower and do not  sleep with pets. Day of Surgery : Do not apply any lotions/deodorants the morning of surgery.  Please wear clean clothes to the hospital/surgery center.  FAILURE TO FOLLOW THESE INSTRUCTIONS MAY RESULT IN THE CANCELLATION OF YOUR SURGERY PATIENT SIGNATURE_________________________________  NURSE SIGNATURE__________________________________  ________________________________________________________________________   Adam Phenix  An incentive spirometer is a tool that can help keep your lungs clear and active. This tool measures how well you are filling your lungs with each breath. Taking long deep breaths may help reverse or decrease the chance of developing breathing (pulmonary) problems (especially infection) following:  A long  period of time when you are unable to move or be active. BEFORE THE PROCEDURE   If the spirometer includes an indicator to show your best effort, your nurse or respiratory therapist will set it to a desired goal.  If possible, sit up straight or lean slightly forward. Try not to slouch.  Hold the incentive spirometer in an upright position. INSTRUCTIONS FOR USE  1. Sit on the edge of your bed if possible, or sit up as far as you can in bed or on a chair. 2. Hold the incentive spirometer in an upright position. 3. Breathe out normally. 4. Place the mouthpiece in your mouth and seal your lips tightly around it. 5. Breathe in slowly and as deeply as possible, raising the piston or the ball toward the top of the column. 6. Hold your breath for 3-5 seconds or for as long as possible. Allow the piston or ball to fall to the bottom of the column. 7. Remove the mouthpiece from your mouth and breathe out normally. 8. Rest for a few seconds and repeat Steps 1 through 7 at least 10 times every 1-2 hours when you are awake. Take your time and take a few normal breaths between deep breaths. 9. The spirometer may include an indicator to show your best effort. Use the indicator as a goal to work toward during each repetition. 10. After each set of 10 deep breaths, practice coughing to be sure your lungs are clear. If you have an incision (the cut made at the time of surgery), support your incision when coughing by placing a pillow or rolled up towels firmly against it. Once you are able to get out of bed, walk around indoors and cough well. You may stop using the incentive spirometer when instructed by your caregiver.  RISKS AND COMPLICATIONS  Take your time so you do not get  dizzy or light-headed.  If you are in pain, you may need to take or ask for pain medication before doing incentive spirometry. It is harder to take a deep breath if you are having pain. AFTER USE  Rest and breathe slowly and  easily.  It can be helpful to keep track of a log of your progress. Your caregiver can provide you with a simple table to help with this. If you are using the spirometer at home, follow these instructions: [place] IF:   You are having difficultly using the spirometer.  You have trouble using the spirometer as often as instructed.  Your pain medication is not giving enough relief while using the spirometer.  You develop fever of 100.5 F (38.1 C) or higher. SEEK IMMEDIATE MEDICAL CARE IF:   You cough up bloody sputum that had not been present before.  You develop fever of 102 F (38.9 C) or greater.  You develop worsening pain at or near the incision site. MAKE SURE YOU:   Understand these instructions.  Will watch your condition.  Will get help right away if you are not doing well or get worse. Document Released: 07/11/2006 Document Revised: 05/23/2011 Document Reviewed: 09/11/2006 ExitCare Patient Information 2014 ExitCare, [place].   ________________________________________________________________________  WHAT IS A BLOOD TRANSFUSION? Blood Transfusion Information  A transfusion is the replacement of blood or some of its parts. Blood is made up of multiple cells which provide different functions.  Red blood cells carry oxygen and are used for blood loss replacement.  White blood cells fight against infection.  Platelets control bleeding.  Plasma helps clot blood.  Other blood products are available for specialized needs, such as hemophilia or other clotting disorders. BEFORE THE TRANSFUSION  Who gives blood for transfusions?   Healthy volunteers who are fully evaluated to make sure their blood is safe. This is blood bank blood. Transfusion therapy is the safest it has ever been in the practice of medicine. Before blood is taken from a donor, a complete history is taken to make sure that person has no history of diseases nor engages in risky social  behavior (examples are intravenous drug use or sexual activity with multiple partners). The donor's travel history is screened to minimize risk of transmitting infections, such as malaria. The donated blood is tested for signs of infectious diseases, such as HIV and hepatitis. The blood is then tested to be sure it is compatible with you in order to minimize the chance of a transfusion reaction. If you or a relative donates blood, this is often done in anticipation of surgery and is not appropriate for emergency situations. It takes many days to process the donated blood. RISKS AND COMPLICATIONS Although transfusion therapy is very safe and saves many lives, the main dangers of transfusion include:   Getting an infectious disease.  Developing a transfusion reaction. This is an allergic reaction to something in the blood you were given. Every precaution is taken to prevent this. The decision to have a blood transfusion has been considered carefully by your caregiver before blood is given. Blood is not given unless the benefits outweigh the risks. AFTER THE TRANSFUSION  Right after receiving a blood transfusion, you will usually feel much better and more energetic. This is especially true if your red blood cells have gotten low (anemic). The transfusion raises the level of the red blood cells which carry oxygen, and this usually causes an energy increase.  The nurse administering the transfusion will  monitor you carefully for complications. HOME CARE INSTRUCTIONS  No special instructions are needed after a transfusion. You may find your energy is better. Speak with your caregiver about any limitations on activity for underlying diseases you may have. SEEK MEDICAL CARE IF:   Your condition is not improving after your transfusion.  You develop redness or irritation at the intravenous (IV) site. SEEK IMMEDIATE MEDICAL CARE IF:  Any of the following symptoms occur over the next 12 hours:  Shaking  chills.  You have a temperature by mouth above 102 F (38.9 C), not controlled by medicine.  Chest, back, or muscle pain.  People around you feel you are not acting correctly or are confused.  Shortness of breath or difficulty breathing.  Dizziness and fainting.  You get a rash or develop hives.  You have a decrease in urine output.  Your urine turns a dark color or changes to pink, red, or brown. Any of the following symptoms occur over the next 10 days:  You have a temperature by mouth above 102 F (38.9 C), not controlled by medicine.  Shortness of breath.  Weakness after normal activity.  The white part of the eye turns yellow (jaundice).  You have a decrease in the amount of urine or are urinating less often.  Your urine turns a dark color or changes to pink, red, or brown. Document Released: 02/26/2000 Document Revised: 05/23/2011 Document Reviewed: 10/15/2007 [facility] Patient Information 2014 [place], [place].  _______________________________________________________________________

## 2016-09-01 ENCOUNTER — Encounter (INDEPENDENT_AMBULATORY_CARE_PROVIDER_SITE_OTHER): Payer: Self-pay

## 2016-09-01 ENCOUNTER — Encounter (HOSPITAL_COMMUNITY)
Admission: RE | Admit: 2016-09-01 | Discharge: 2016-09-01 | Disposition: A | Discharge status: Home / Self Care | Payer: BC Managed Care – PPO | Source: Ambulatory Visit | Attending: Orthopedic Surgery | Admitting: Orthopedic Surgery

## 2016-09-01 ENCOUNTER — Encounter (HOSPITAL_COMMUNITY): Payer: Self-pay

## 2016-09-01 DIAGNOSIS — M1712 Unilateral primary osteoarthritis, left knee: Secondary | ICD-10-CM | POA: Diagnosis not present

## 2016-09-01 DIAGNOSIS — Z01818 Encounter for other preprocedural examination: Secondary | ICD-10-CM | POA: Insufficient documentation

## 2016-09-01 HISTORY — DX: Pneumonia, unspecified organism: J18.9

## 2016-09-01 HISTORY — DX: Effect of reduced temperature, unspecified, subsequent encounter: T69.9XXD

## 2016-09-01 LAB — SURGICAL PCR SCREEN
MRSA, PCR: NEGATIVE
STAPHYLOCOCCUS AUREUS: NEGATIVE

## 2016-09-01 LAB — COMPREHENSIVE METABOLIC PANEL
ALT: 14 U/L (ref 14–54)
AST: 16 U/L (ref 15–41)
Albumin: 3.6 g/dL (ref 3.5–5.0)
Alkaline Phosphatase: 56 U/L (ref 38–126)
Anion gap: 8 (ref 5–15)
BUN: 15 mg/dL (ref 6–20)
CO2: 31 mmol/L (ref 22–32)
Calcium: 9.5 mg/dL (ref 8.9–10.3)
Chloride: 101 mmol/L (ref 101–111)
Creatinine, Ser: 0.84 mg/dL (ref 0.44–1.00)
GFR calc Af Amer: >60 mL/min (ref >60)
GFR calc non Af Amer: >60 mL/min (ref >60)
Glucose, Bld: 99 mg/dL (ref 65–99)
Potassium: 3.6 mmol/L (ref 3.5–5.1)
Sodium: 140 mmol/L (ref 135–145)
Total Bilirubin: 0.6 mg/dL (ref 0.3–1.2)
Total Protein: 7.9 g/dL (ref 6.5–8.1)

## 2016-09-01 LAB — CBC
HCT: 40.2 % (ref 36.0–46.0)
HEMOGLOBIN: 12.6 g/dL (ref 12.0–15.0)
MCH: 29.3 pg (ref 26.0–34.0)
MCHC: 31.3 g/dL (ref 30.0–36.0)
MCV: 93.5 fL (ref 78.0–100.0)
PLATELETS: 305 10*3/uL (ref 150–400)
RBC: 4.3 MIL/uL (ref 3.87–5.11)
RDW: 14.7 % (ref 11.5–15.5)
WBC: 7 10*3/uL (ref 4.0–10.5)

## 2016-09-01 LAB — APTT: aPTT: 36 seconds (ref 24–36)

## 2016-09-01 LAB — PROTIME-INR
INR: 1.53
Prothrombin Time: 18.6 seconds — ABNORMAL HIGH (ref 11.4–15.2)

## 2016-09-01 LAB — ABO/RH: ABO/RH(D): B POS

## 2016-09-01 NOTE — Progress Notes (Signed)
09-01-16 PT/INR result, routed to Dr Lequita HaltAluisio for review.

## 2016-09-02 NOTE — Progress Notes (Signed)
Per Dr. Lequita HaltAluisio, repeat PT/INR day of surgery. Order placed.

## 2016-09-03 ENCOUNTER — Ambulatory Visit: Payer: Self-pay | Admitting: Orthopedic Surgery

## 2016-09-03 NOTE — H&P (Signed)
Laura [name] DOB: 1955/09/07 Married / Language: English / Race: Black or African American Female Date of Admission:  09/05/2016 CC:  Left Knee Pain History of Present Illness  The patient is a 61 year old female who comes in for a preoperative History and Physical. The patient is scheduled for a left total knee arthroplasty to be performed by Dr. Gus Rankin. Aluisio, MD at [facility] on 09-05-2016. The patient is a 61 year old female who presented for follow up of their knee. The patient is being followed for their left (worse than right) knee pain and osteoarthritis. They are now months out from Euflexxa series. Symptoms reported include: pain, swelling, aching, stiffness, locking, catching, instability, difficulty ambulating and difficulty arising from chair. The patient feels that they are doing poorly and report their pain level to be moderate to severe. The following medication has been used for pain control: Tylenol (patient cannot take NSAIDs). The patient has not gotten any relief of their symptoms with viscosupplementation (She states the right knee is better, but feels like the left knee is worse. She is wondering if she has something else going on, other than arthritis).Unfortunately, her left knee is getting progressively worse over time. It is bothering her all times now. The injections did not help at all. They did help her right knee, which is doing well. Left knee is limiting what she can and cannot do. She has advanced end-stage arthritis of that knee with progressive pain and dysfunction. Cortisone helps her for six weeks usually. We will go ahead and proceed with a total knee arthroplasty. They have been treated conservatively in the past for the above stated problem and despite conservative measures, they continue to have progressive pain and severe functional limitations and dysfunction. They have failed non-operative management including home exercise, medications, and  injections. It is felt that they would benefit from undergoing total joint replacement. Risks and benefits of the procedure have been discussed with the patient and they elect to proceed with surgery. There are no active contraindications to surgery such as ongoing infection or rapidly progressive neurological disease.  Problem List/Past Medical  Primary osteoarthritis of both knees (M17.0)  High blood pressure  Osteoarthritis  Anemia  Blood Clot  Vitamin D Deficiency  Exercise-induced Asthma  Herpes Simplex Vulvovaginitis  Morbid Obesity  Edema   Allergies No Known Drug Allergies  Family History Cancer  mother and father Depression  mother First Degree Relatives  reported Hypertension  Mother. mother  Social History Alcohol use  current drinker; drinks wine; only occasionally per week Children  2 Current work status  working full time Drug/Alcohol Rehab (Currently)  no Drug/Alcohol Rehab (Previously)  no Exercise  Exercises weekly; does gym / weights Illicit drug use  no Living situation  live with spouse Marital status  married Never smoker  Number of flights of stairs before winded  1 Pain Contract  no Tobacco / smoke exposure  no Tobacco use  Never smoker. never smoker  Medication History Tylenol (Oral as needed) Specific strength unknown - Active. Xarelto (Oral) Specific strength unknown - Active. Vitamin D (50000UNIT Capsule, Oral) Active. Carvedilol (Oral) Specific strength unknown - Active. HydroCHLOROthiazide (25MG  Tablet, Oral) Active.  Past Surgical History Cesarean Delivery  2 times; 1993, 1997 Cataract Extraction-Left   Review of Systems  General Not Present- Chills, Fatigue, Fever, Memory Loss, Night Sweats, Weight Gain and Weight Loss. Skin Not Present- Eczema, Hives, Itching, Lesions and Rash. HEENT Not Present- Dentures, Double Vision,  Headache, Hearing Loss, Tinnitus and Visual Loss. Respiratory Not Present-  Allergies, Chronic Cough, Coughing up blood, Shortness of breath at rest and Shortness of breath with exertion. Cardiovascular Not Present- Chest Pain, Difficulty Breathing Lying Down, Murmur, Palpitations, Racing/skipping heartbeats and Swelling. Gastrointestinal Not Present- Abdominal Pain, Bloody Stool, Constipation, Diarrhea, Difficulty Swallowing, Heartburn, Jaundice, Loss of appetitie, Nausea and Vomiting. Female Genitourinary Not Present- Blood in Urine, Discharge, Flank Pain, Incontinence, Painful Urination, Urgency, Urinary frequency, Urinary Retention, Urinating at Night and Weak urinary stream. Musculoskeletal Present- Joint Pain. Not Present- Back Pain, Joint Swelling, Morning Stiffness, Muscle Pain, Muscle Weakness and Spasms. Neurological Not Present- Blackout spells, Difficulty with balance, Dizziness, Paralysis, Tremor and Weakness. Psychiatric Not Present- Insomnia.  Vitals  Weight: 275 lb Height: 67in Body Surface Area: 2.32 m Body Mass Index: 43.07 kg/m  Pulse: 84 (Regular)  BP: 126/88 (Sitting, Left Arm, Standard)  Physical Exam  General Mental Status -Alert, cooperative and good historian. General Appearance-pleasant, Not in acute distress. Orientation-Oriented X3. Build & Nutrition-Well nourished and Well developed.  Head and Neck Head-normocephalic, atraumatic . Neck Global Assessment - supple, no bruit auscultated on the right, no bruit auscultated on the left.  Eye Pupil - Bilateral-Regular and Round. Motion - Bilateral-EOMI.  Chest and Lung Exam Auscultation Breath sounds - clear at anterior chest wall and clear at posterior chest wall. Adventitious sounds - No Adventitious sounds.  Cardiovascular Auscultation Rhythm - Regular rate and rhythm. Heart Sounds - S1 WNL and S2 WNL. Murmurs & Other Heart Sounds - Auscultation of the heart reveals - No Murmurs.  Abdomen Palpation/Percussion Tenderness - Abdomen is non-tender to  palpation. Rigidity (guarding) - Abdomen is soft. Auscultation Auscultation of the abdomen reveals - Bowel sounds normal.  Female Genitourinary Note: Not done, not pertinent to present illness   Musculoskeletal Note: Her left knee shows no swelling. Range about 5 to 120, marked crepitus on range of motion. Tender medial greater than lateral, with no instability.  Assessment & Plan  Primary osteoarthritis of both knees (M17.0)  Note:Surgical Plans: Left Total Knee Replacement  Disposition: Home with family  PCP: Dr. Laurann Montanaynthia White - Patient has been seen preoperatively and felt to be stable for surgery. "Anticoagulation recommendations per Dr. Cyndie ChimeGranfortuna". Hem/Onc: Dr. Cyndie ChimeGranfortuna - Patient has been seen preoperatively and felt to be stable for surgery. "Hold Xarelto 24 hours before surgery. Resume next day post op".  Topical TXA - PE following a flight 2007  Anesthesia Issues: None  Patient was instructed on what medications to stop prior to surgery.  Signed electronically by Lauraine RinneAlexzandrew L Perkins, III PA-C

## 2016-09-04 MED ORDER — DEXTROSE 5 % IV SOLN
3.0000 g | INTRAVENOUS | Status: AC
  Administered 2016-09-05: 3 g via INTRAVENOUS
  Filled 2016-09-04: qty 3
  Filled 2016-09-04: qty 3000

## 2016-09-05 ENCOUNTER — Inpatient Hospital Stay (HOSPITAL_COMMUNITY)
Admission: RE | Admit: 2016-09-05 | Discharge: 2016-09-08 | DRG: 470 | Disposition: A | Discharge status: Home / Self Care | Payer: BC Managed Care – PPO | Source: Ambulatory Visit | Attending: Orthopedic Surgery | Admitting: Orthopedic Surgery

## 2016-09-05 ENCOUNTER — Encounter (HOSPITAL_COMMUNITY): Payer: Self-pay | Admitting: *Deleted

## 2016-09-05 ENCOUNTER — Inpatient Hospital Stay (HOSPITAL_COMMUNITY): Payer: BC Managed Care – PPO | Admitting: Anesthesiology

## 2016-09-05 ENCOUNTER — Encounter (HOSPITAL_COMMUNITY)
Admission: RE | Disposition: A | Discharge status: Home / Self Care | Payer: Self-pay | Source: Ambulatory Visit | Attending: Orthopedic Surgery

## 2016-09-05 DIAGNOSIS — Z79899 Other long term (current) drug therapy: Secondary | ICD-10-CM | POA: Diagnosis not present

## 2016-09-05 DIAGNOSIS — Z6841 Body Mass Index (BMI) 40.0 and over, adult: Secondary | ICD-10-CM | POA: Diagnosis not present

## 2016-09-05 DIAGNOSIS — I10 Essential (primary) hypertension: Secondary | ICD-10-CM | POA: Diagnosis present

## 2016-09-05 DIAGNOSIS — M1712 Unilateral primary osteoarthritis, left knee: Principal | ICD-10-CM | POA: Diagnosis present

## 2016-09-05 DIAGNOSIS — Z86711 Personal history of pulmonary embolism: Secondary | ICD-10-CM | POA: Diagnosis not present

## 2016-09-05 DIAGNOSIS — E559 Vitamin D deficiency, unspecified: Secondary | ICD-10-CM | POA: Diagnosis present

## 2016-09-05 DIAGNOSIS — R110 Nausea: Secondary | ICD-10-CM | POA: Diagnosis not present

## 2016-09-05 DIAGNOSIS — M179 Osteoarthritis of knee, unspecified: Secondary | ICD-10-CM | POA: Diagnosis present

## 2016-09-05 DIAGNOSIS — M1710 Unilateral primary osteoarthritis, unspecified knee: Secondary | ICD-10-CM | POA: Diagnosis present

## 2016-09-05 DIAGNOSIS — M170 Bilateral primary osteoarthritis of knee: Secondary | ICD-10-CM | POA: Diagnosis present

## 2016-09-05 HISTORY — PX: TOTAL KNEE ARTHROPLASTY: SHX125

## 2016-09-05 LAB — TYPE AND SCREEN
ABO/RH(D): B POS
Antibody Screen: NEGATIVE

## 2016-09-05 LAB — PROTIME-INR
INR: 1.21
Prothrombin Time: 15.4 seconds — ABNORMAL HIGH (ref 11.4–15.2)

## 2016-09-05 SURGERY — ARTHROPLASTY, KNEE, TOTAL
Anesthesia: General | Site: Knee | Laterality: Left

## 2016-09-05 MED ORDER — ACETAMINOPHEN 325 MG PO TABS: 650.0000 mg | ORAL_TABLET | Freq: Four times a day (QID) | ORAL | Status: DC | PRN

## 2016-09-05 MED ORDER — SODIUM CHLORIDE 0.9 % IV SOLN
INTRAVENOUS | Status: DC
  Administered 2016-09-05: 16:00:00 via INTRAVENOUS

## 2016-09-05 MED ORDER — DEXAMETHASONE SODIUM PHOSPHATE 10 MG/ML IJ SOLN
10.0000 mg | Freq: Once | INTRAMUSCULAR | Status: AC
  Administered 2016-09-06: 09:00:00 10 mg via INTRAVENOUS
  Filled 2016-09-05: qty 1

## 2016-09-05 MED ORDER — METOCLOPRAMIDE HCL 5 MG/ML IJ SOLN: 5.0000 mg | Freq: Three times a day (TID) | INTRAMUSCULAR | Status: DC | PRN

## 2016-09-05 MED ORDER — GABAPENTIN 300 MG PO CAPS
300.0000 mg | ORAL_CAPSULE | Freq: Once | ORAL | Status: AC
  Administered 2016-09-05: 300 mg via ORAL

## 2016-09-05 MED ORDER — FENTANYL CITRATE (PF) 100 MCG/2ML IJ SOLN
INTRAMUSCULAR | Status: AC
  Administered 2016-09-05: 50 ug via INTRAVENOUS
  Filled 2016-09-05: qty 2

## 2016-09-05 MED ORDER — FLEET ENEMA 7-19 GM/118ML RE ENEM: 1.0000 | ENEMA | Freq: Once | RECTAL | Status: DC | PRN

## 2016-09-05 MED ORDER — ACETAMINOPHEN 10 MG/ML IV SOLN
1000.0000 mg | Freq: Once | INTRAVENOUS | Status: AC
Start: 2016-09-05 — End: 2016-09-05
  Administered 2016-09-05: 1000 mg via INTRAVENOUS

## 2016-09-05 MED ORDER — BUPIVACAINE-EPINEPHRINE (PF) 0.5% -1:200000 IJ SOLN
INTRAMUSCULAR | Status: DC | PRN
  Administered 2016-09-05: 25 mL via PERINEURAL

## 2016-09-05 MED ORDER — LIDOCAINE 2% (20 MG/ML) 5 ML SYRINGE
INTRAMUSCULAR | Status: DC | PRN
  Administered 2016-09-05: 80 mg via INTRAVENOUS

## 2016-09-05 MED ORDER — LIDOCAINE 2% (20 MG/ML) 5 ML SYRINGE
INTRAMUSCULAR | Status: AC
  Filled 2016-09-05: qty 5

## 2016-09-05 MED ORDER — GABAPENTIN 300 MG PO CAPS
ORAL_CAPSULE | ORAL | Status: AC
  Administered 2016-09-05: 300 mg via ORAL
  Filled 2016-09-05: qty 1

## 2016-09-05 MED ORDER — LACTATED RINGERS IV SOLN
INTRAVENOUS | Status: DC
  Administered 2016-09-05 (×2): via INTRAVENOUS

## 2016-09-05 MED ORDER — POLYETHYLENE GLYCOL 3350 17 G PO PACK
17.0000 g | PACK | Freq: Every day | ORAL | Status: DC | PRN
  Administered 2016-09-07: 17 g via ORAL
  Filled 2016-09-05: qty 1

## 2016-09-05 MED ORDER — SUCCINYLCHOLINE CHLORIDE 200 MG/10ML IV SOSY
PREFILLED_SYRINGE | INTRAVENOUS | Status: DC | PRN
  Administered 2016-09-05: 120 mg via INTRAVENOUS

## 2016-09-05 MED ORDER — MIDAZOLAM HCL 5 MG/ML IJ SOLN: 2.0000 mg | Freq: Once | INTRAMUSCULAR | Status: DC

## 2016-09-05 MED ORDER — PROPOFOL 10 MG/ML IV BOLUS
INTRAVENOUS | Status: AC
  Filled 2016-09-05: qty 60

## 2016-09-05 MED ORDER — SODIUM CHLORIDE 0.9 % IJ SOLN
INTRAMUSCULAR | Status: AC
  Filled 2016-09-05: qty 10

## 2016-09-05 MED ORDER — DIPHENHYDRAMINE HCL 12.5 MG/5ML PO ELIX: 12.5000 mg | ORAL_SOLUTION | ORAL | Status: DC | PRN

## 2016-09-05 MED ORDER — TRANEXAMIC ACID 1000 MG/10ML IV SOLN
2000.0000 mg | Freq: Once | INTRAVENOUS | Status: DC
  Filled 2016-09-05: qty 20

## 2016-09-05 MED ORDER — BISACODYL 10 MG RE SUPP: 10.0000 mg | Freq: Every day | RECTAL | Status: DC | PRN

## 2016-09-05 MED ORDER — DEXAMETHASONE SODIUM PHOSPHATE 10 MG/ML IJ SOLN
INTRAMUSCULAR | Status: AC
  Filled 2016-09-05: qty 1

## 2016-09-05 MED ORDER — SUCCINYLCHOLINE CHLORIDE 200 MG/10ML IV SOSY
PREFILLED_SYRINGE | INTRAVENOUS | Status: AC
  Filled 2016-09-05: qty 10

## 2016-09-05 MED ORDER — METHOCARBAMOL 1000 MG/10ML IJ SOLN
500.0000 mg | Freq: Four times a day (QID) | INTRAMUSCULAR | Status: DC | PRN
  Administered 2016-09-05: 500 mg via INTRAVENOUS
  Filled 2016-09-05: qty 550

## 2016-09-05 MED ORDER — ACETAMINOPHEN 500 MG PO TABS
1000.0000 mg | ORAL_TABLET | Freq: Four times a day (QID) | ORAL | Status: AC
  Administered 2016-09-05 – 2016-09-06 (×4): 1000 mg via ORAL
  Filled 2016-09-05 (×4): qty 2

## 2016-09-05 MED ORDER — PROPOFOL 10 MG/ML IV BOLUS
INTRAVENOUS | Status: DC | PRN
  Administered 2016-09-05: 150 mg via INTRAVENOUS

## 2016-09-05 MED ORDER — CEFAZOLIN SODIUM-DEXTROSE 2-4 GM/100ML-% IV SOLN
2.0000 g | Freq: Four times a day (QID) | INTRAVENOUS | Status: AC
  Administered 2016-09-05 (×2): 2 g via INTRAVENOUS
  Filled 2016-09-05 (×2): qty 100

## 2016-09-05 MED ORDER — MIDAZOLAM HCL 2 MG/2ML IJ SOLN
INTRAMUSCULAR | Status: AC
  Administered 2016-09-05: 2 mg
  Filled 2016-09-05: qty 2

## 2016-09-05 MED ORDER — HYDROCHLOROTHIAZIDE 25 MG PO TABS
25.0000 mg | ORAL_TABLET | Freq: Every day | ORAL | Status: DC
  Administered 2016-09-06 – 2016-09-08 (×3): 25 mg via ORAL
  Filled 2016-09-05 (×3): qty 1

## 2016-09-05 MED ORDER — ACETAMINOPHEN 650 MG RE SUPP: 650.0000 mg | Freq: Four times a day (QID) | RECTAL | Status: DC | PRN

## 2016-09-05 MED ORDER — ONDANSETRON HCL 4 MG/2ML IJ SOLN
INTRAMUSCULAR | Status: AC
  Filled 2016-09-05: qty 2

## 2016-09-05 MED ORDER — DOCUSATE SODIUM 100 MG PO CAPS
100.0000 mg | ORAL_CAPSULE | Freq: Two times a day (BID) | ORAL | Status: DC
  Administered 2016-09-05 – 2016-09-08 (×6): 100 mg via ORAL
  Filled 2016-09-05 (×5): qty 1

## 2016-09-05 MED ORDER — CHLORHEXIDINE GLUCONATE 4 % EX LIQD: 60.0000 mL | Freq: Once | CUTANEOUS | Status: DC

## 2016-09-05 MED ORDER — METHOCARBAMOL 500 MG PO TABS
500.0000 mg | ORAL_TABLET | Freq: Four times a day (QID) | ORAL | Status: DC | PRN
  Administered 2016-09-05: 500 mg via ORAL
  Filled 2016-09-05: qty 1

## 2016-09-05 MED ORDER — METOCLOPRAMIDE HCL 5 MG PO TABS: 5.0000 mg | ORAL_TABLET | Freq: Three times a day (TID) | ORAL | Status: DC | PRN

## 2016-09-05 MED ORDER — STERILE WATER FOR IRRIGATION IR SOLN
Status: DC | PRN
  Administered 2016-09-05: 2000 mL

## 2016-09-05 MED ORDER — ONDANSETRON HCL 4 MG/2ML IJ SOLN
INTRAMUSCULAR | Status: DC | PRN
  Administered 2016-09-05: 4 mg via INTRAVENOUS

## 2016-09-05 MED ORDER — BUPIVACAINE LIPOSOME 1.3 % IJ SUSP
20.0000 mL | Freq: Once | INTRAMUSCULAR | Status: DC
  Filled 2016-09-05: qty 20

## 2016-09-05 MED ORDER — ONDANSETRON HCL 4 MG PO TABS
4.0000 mg | ORAL_TABLET | Freq: Four times a day (QID) | ORAL | Status: DC | PRN
  Administered 2016-09-08: 4 mg via ORAL
  Filled 2016-09-05: qty 1

## 2016-09-05 MED ORDER — CARVEDILOL 6.25 MG PO TABS
6.2500 mg | ORAL_TABLET | Freq: Two times a day (BID) | ORAL | Status: DC
  Administered 2016-09-05 – 2016-09-08 (×6): 6.25 mg via ORAL
  Filled 2016-09-05 (×7): qty 1

## 2016-09-05 MED ORDER — FENTANYL CITRATE (PF) 250 MCG/5ML IJ SOLN
INTRAMUSCULAR | Status: DC | PRN
  Administered 2016-09-05 (×2): 25 ug via INTRAVENOUS
  Administered 2016-09-05: 50 ug via INTRAVENOUS

## 2016-09-05 MED ORDER — DEXAMETHASONE SODIUM PHOSPHATE 10 MG/ML IJ SOLN
10.0000 mg | Freq: Once | INTRAMUSCULAR | Status: AC
  Administered 2016-09-05: 10 mg via INTRAVENOUS

## 2016-09-05 MED ORDER — 0.9 % SODIUM CHLORIDE (POUR BTL) OPTIME
TOPICAL | Status: DC | PRN
  Administered 2016-09-05: 1000 mL

## 2016-09-05 MED ORDER — SODIUM CHLORIDE 0.9 % IJ SOLN
INTRAMUSCULAR | Status: AC
  Filled 2016-09-05: qty 50

## 2016-09-05 MED ORDER — SODIUM CHLORIDE 0.9 % IR SOLN
Status: DC | PRN
  Administered 2016-09-05: 1000 mL

## 2016-09-05 MED ORDER — SODIUM CHLORIDE 0.9 % IJ SOLN
INTRAMUSCULAR | Status: DC | PRN
  Administered 2016-09-05 (×2): 30 mL

## 2016-09-05 MED ORDER — PHENOL 1.4 % MT LIQD: 1.0000 | OROMUCOSAL | Status: DC | PRN

## 2016-09-05 MED ORDER — BUPIVACAINE HCL (PF) 0.25 % IJ SOLN
INTRAMUSCULAR | Status: AC
  Filled 2016-09-05: qty 30

## 2016-09-05 MED ORDER — ROCURONIUM BROMIDE 50 MG/5ML IV SOSY
PREFILLED_SYRINGE | INTRAVENOUS | Status: AC
  Filled 2016-09-05: qty 5

## 2016-09-05 MED ORDER — ACETAMINOPHEN 10 MG/ML IV SOLN
INTRAVENOUS | Status: AC
  Filled 2016-09-05: qty 100

## 2016-09-05 MED ORDER — FENTANYL CITRATE (PF) 100 MCG/2ML IJ SOLN
100.0000 ug | Freq: Once | INTRAMUSCULAR | Status: AC
  Administered 2016-09-05: 50 ug via INTRAVENOUS

## 2016-09-05 MED ORDER — ONDANSETRON HCL 4 MG/2ML IJ SOLN
4.0000 mg | Freq: Four times a day (QID) | INTRAMUSCULAR | Status: DC | PRN
  Administered 2016-09-07: 4 mg via INTRAVENOUS
  Filled 2016-09-05: qty 2

## 2016-09-05 MED ORDER — RIVAROXABAN 10 MG PO TABS
10.0000 mg | ORAL_TABLET | Freq: Every day | ORAL | Status: DC
  Administered 2016-09-06 – 2016-09-07 (×2): 10 mg via ORAL
  Filled 2016-09-05 (×2): qty 1

## 2016-09-05 MED ORDER — OXYCODONE HCL 5 MG PO TABS
5.0000 mg | ORAL_TABLET | ORAL | Status: DC | PRN
  Administered 2016-09-05 (×3): 5 mg via ORAL
  Administered 2016-09-06 – 2016-09-07 (×6): 10 mg via ORAL
  Filled 2016-09-05 (×2): qty 2
  Filled 2016-09-05: qty 1
  Filled 2016-09-05: qty 2
  Filled 2016-09-05: qty 1
  Filled 2016-09-05: qty 2
  Filled 2016-09-05: qty 1
  Filled 2016-09-05 (×2): qty 2

## 2016-09-05 MED ORDER — MENTHOL 3 MG MT LOZG: 1.0000 | LOZENGE | OROMUCOSAL | Status: DC | PRN

## 2016-09-05 MED ORDER — TRAMADOL HCL 50 MG PO TABS
50.0000 mg | ORAL_TABLET | Freq: Four times a day (QID) | ORAL | Status: DC | PRN
Start: 2016-09-05 — End: 2016-09-08

## 2016-09-05 MED ORDER — TRANEXAMIC ACID 1000 MG/10ML IV SOLN
INTRAVENOUS | Status: AC | PRN
  Administered 2016-09-05: 2000 mg via TOPICAL

## 2016-09-05 MED ORDER — BUPIVACAINE LIPOSOME 1.3 % IJ SUSP
INTRAMUSCULAR | Status: DC | PRN
  Administered 2016-09-05 (×2): 10 mL

## 2016-09-05 MED ORDER — FENTANYL CITRATE (PF) 100 MCG/2ML IJ SOLN
INTRAMUSCULAR | Status: AC
  Filled 2016-09-05: qty 2

## 2016-09-05 SURGICAL SUPPLY — 52 items
BAG DECANTER FOR FLEXI CONT (MISCELLANEOUS) ×3 IMPLANT
BAG ZIPLOCK 12X15 (MISCELLANEOUS) ×3 IMPLANT
BANDAGE ACE 6X5 VEL STRL LF (GAUZE/BANDAGES/DRESSINGS) ×3 IMPLANT
BLADE SAG 18X100X1.27 (BLADE) ×3 IMPLANT
BLADE SAW SGTL 11.0X1.19X90.0M (BLADE) ×3 IMPLANT
BOWL SMART MIX CTS (DISPOSABLE) ×3 IMPLANT
CAPT KNEE TOTAL 3 ATTUNE ×3 IMPLANT
CEMENT HV SMART SET (Cement) ×6 IMPLANT
CLOSURE WOUND 1/2 X4 (GAUZE/BANDAGES/DRESSINGS) ×2
COVER SURGICAL LIGHT HANDLE (MISCELLANEOUS) ×3 IMPLANT
CUFF TOURN SGL QUICK 34 (TOURNIQUET CUFF)
CUFF TOURN SGL QUICK 44 (TOURNIQUET CUFF) ×3 IMPLANT
CUFF TRNQT CYL 34X4X40X1 (TOURNIQUET CUFF) IMPLANT
DECANTER SPIKE VIAL GLASS SM (MISCELLANEOUS) ×3 IMPLANT
DRAPE U-SHAPE 47X51 STRL (DRAPES) ×3 IMPLANT
DRSG ADAPTIC 3X8 NADH LF (GAUZE/BANDAGES/DRESSINGS) ×3 IMPLANT
DRSG PAD ABDOMINAL 8X10 ST (GAUZE/BANDAGES/DRESSINGS) ×3 IMPLANT
DURAPREP 26ML APPLICATOR (WOUND CARE) ×3 IMPLANT
ELECT REM PT RETURN 15FT ADLT (MISCELLANEOUS) ×3 IMPLANT
EVACUATOR 1/8 PVC DRAIN (DRAIN) ×3 IMPLANT
GAUZE SPONGE 4X4 12PLY STRL (GAUZE/BANDAGES/DRESSINGS) ×3 IMPLANT
GLOVE BIO SURGEON STRL SZ7.5 (GLOVE) ×3 IMPLANT
GLOVE BIO SURGEON STRL SZ8 (GLOVE) ×3 IMPLANT
GLOVE BIOGEL PI IND STRL 6.5 (GLOVE) IMPLANT
GLOVE BIOGEL PI IND STRL 8 (GLOVE) ×2 IMPLANT
GLOVE BIOGEL PI INDICATOR 6.5 (GLOVE)
GLOVE BIOGEL PI INDICATOR 8 (GLOVE) ×4
GLOVE SURG SS PI 6.5 STRL IVOR (GLOVE) IMPLANT
GOWN STRL REUS W/TWL LRG LVL3 (GOWN DISPOSABLE) ×3 IMPLANT
GOWN STRL REUS W/TWL XL LVL3 (GOWN DISPOSABLE) ×3 IMPLANT
HANDPIECE INTERPULSE COAX TIP (DISPOSABLE) ×2
IMMOBILIZER KNEE 20 (SOFTGOODS) ×3
IMMOBILIZER KNEE 20 THIGH 36 (SOFTGOODS) ×1 IMPLANT
MANIFOLD NEPTUNE II (INSTRUMENTS) ×3 IMPLANT
NS IRRIG 1000ML POUR BTL (IV SOLUTION) ×3 IMPLANT
PACK TOTAL KNEE CUSTOM (KITS) ×3 IMPLANT
PADDING CAST COTTON 6X4 STRL (CAST SUPPLIES) ×6 IMPLANT
POSITIONER SURGICAL ARM (MISCELLANEOUS) ×3 IMPLANT
SET HNDPC FAN SPRY TIP SCT (DISPOSABLE) ×1 IMPLANT
STRIP CLOSURE SKIN 1/2X4 (GAUZE/BANDAGES/DRESSINGS) ×4 IMPLANT
SUT MNCRL AB 4-0 PS2 18 (SUTURE) ×3 IMPLANT
SUT STRATAFIX 0 PDS 27 VIOLET (SUTURE) ×3
SUT VIC AB 2-0 CT1 27 (SUTURE) ×8
SUT VIC AB 2-0 CT1 TAPERPNT 27 (SUTURE) ×4 IMPLANT
SUTURE STRATFX 0 PDS 27 VIOLET (SUTURE) ×1 IMPLANT
SYR 30ML LL (SYRINGE) ×6 IMPLANT
SYRINGE 60CC LL (MISCELLANEOUS) ×3 IMPLANT
TRAY FOLEY CATH 14FRSI W/METER (CATHETERS) ×3 IMPLANT
TRAY FOLEY W/METER SILVER 16FR (SET/KITS/TRAYS/PACK) IMPLANT
WATER STERILE IRR 1000ML POUR (IV SOLUTION) ×6 IMPLANT
WRAP KNEE MAXI GEL POST OP (GAUZE/BANDAGES/DRESSINGS) ×3 IMPLANT
YANKAUER SUCT BULB TIP 10FT TU (MISCELLANEOUS) ×3 IMPLANT

## 2016-09-05 NOTE — Transfer of Care (Signed)
Immediate Anesthesia Transfer of Care Note  Patient: Eddie NorthLori Bogie  Procedure(s) Performed: Procedure(s): LEFT TOTAL KNEE ARTHROPLASTY (Left)  Patient Location: PACU  Anesthesia Type:General and Regional  Level of Consciousness: drowsy and patient cooperative  Airway & Oxygen Therapy: Patient Spontanous Breathing and Patient connected to face mask oxygen  Post-op Assessment: Report given to RN and Post -op Vital signs reviewed and stable  Post vital signs: Reviewed and stable  Last Vitals:  Vitals:   09/05/16 0811 09/05/16 0812  BP:    Pulse: 66 67  Resp: 17   Temp:      Last Pain:  Vitals:   09/05/16 0601  TempSrc: Oral      Patients Stated Pain Goal: 3 (09/05/16 0645)  Complications: No apparent anesthesia complications

## 2016-09-05 NOTE — Anesthesia Procedure Notes (Signed)
Anesthesia Regional Block: Adductor canal block   Pre-Anesthetic Checklist: ,, timeout performed, Correct Patient, Correct Site, Correct Laterality, Correct Procedure, Correct Position, site marked, Risks and benefits discussed, pre-op evaluation,  At surgeon's request and post-op pain management  Laterality: Left  Prep: chloraprep       Needles:   Needle Type: Echogenic Needle     Needle Length: 9cm  Needle Gauge: 21     Additional Needles:   Procedures: ultrasound guided,,,,,,,,  Narrative:  Start time: 09/05/2016 8:01 AM End time: 09/05/2016 8:13 AM Injection made incrementally with aspirations every 5 mL. Anesthesiologist: Cristela BlueJACKSON, Jaheem Hedgepath  Additional Notes: 25cc .5% Marcaine

## 2016-09-05 NOTE — Op Note (Signed)
OPERATIVE REPORT-TOTAL KNEE ARTHROPLASTY   Pre-operative diagnosis- Osteoarthritis  Left knee(s)  Post-operative diagnosis- Osteoarthritis Left knee(s)  Procedure-  Left  Total Knee Arthroplasty  Surgeon- Gus Rankin. Breyson Kelm, MD  Assistant- Avel Peace, PA-C   Anesthesia-  GA combined with regional for post-op pain  EBL-* No blood loss amount entered *   Drains Hemovac  Tourniquet time-  Total Tourniquet Time Documented: Thigh (Left) - 37 minutes Total: Thigh (Left) - 37 minutes     Complications- None  Condition-PACU - hemodynamically stable.   Brief Clinical Note  Laura [name] is a 61 y.o. year old female with end stage OA of her left knee with progressively worsening pain and dysfunction. She has constant pain, with activity and at rest and significant functional deficits with difficulties even with ADLs. She has had extensive non-op management including analgesics, injections of cortisone and viscosupplements, and home exercise program, but remains in significant pain with significant dysfunction. Radiographs show bone on bone arthritis medial and patellofemoral. She presents now for left Total Knee Arthroplasty.    Procedure in detail---   The patient is brought into the operating room and positioned supine on the operating table. After successful administration of  GA combined with regional for post-op pain,   a tourniquet is placed high on the  Left thigh(s) and the lower extremity is prepped and draped in the usual sterile fashion. Time out is performed by the operating team and then the  Left lower extremity is wrapped in Esmarch, knee flexed and the tourniquet inflated to 300 mmHg.       A midline incision is made with a ten blade through the subcutaneous tissue to the level of the extensor mechanism. A fresh blade is used to make a medial parapatellar arthrotomy. Soft tissue over the proximal medial tibia is subperiosteally elevated to the joint line with a knife and  into the semimembranosus bursa with a Cobb elevator. Soft tissue over the proximal lateral tibia is elevated with attention being paid to avoiding the patellar tendon on the tibial tubercle. The patella is everted, knee flexed 90 degrees and the ACL and PCL are removed. Findings are bone on bone medial and patellofemoral with large global osteophytes.        The drill is used to create a starting hole in the distal femur and the canal is thoroughly irrigated with sterile saline to remove the fatty contents. The 5 degree Left  valgus alignment guide is placed into the femoral canal and the distal femoral cutting block is pinned to remove 9 mm off the distal femur. Resection is made with an oscillating saw.      The tibia is subluxed forward and the menisci are removed. The extramedullary alignment guide is placed referencing proximally at the medial aspect of the tibial tubercle and distally along the second metatarsal axis and tibial crest. The block is pinned to remove 2mm off the more deficient medial  side. Resection is made with an oscillating saw. Size 5 is the most appropriate size for the tibia and the proximal tibia is prepared with the modular drill and keel punch for that size.      The femoral sizing guide is placed and size 6 is most appropriate. Rotation is marked off the epicondylar axis and confirmed by creating a rectangular flexion gap at 90 degrees. The size 6 cutting block is pinned in this rotation and the anterior, posterior and chamfer cuts are made with the oscillating saw. The intercondylar block  is then placed and that cut is made.      Trial size 5 tibial component, trial size 6 posterior stabilized femur and a 6  mm posterior stabilized rotating platform insert trial is placed. Full extension is achieved with excellent varus/valgus and anterior/posterior balance throughout full range of motion. The patella is everted and thickness measured to be 24  mm. Free hand resection is taken to  14 mm, a 38 template is placed, lug holes are drilled, trial patella is placed, and it tracks normally. Osteophytes are removed off the posterior femur with the trial in place. All trials are removed and the cut bone surfaces prepared with pulsatile lavage. Cement is mixed and once ready for implantation, the size 5 tibial implant, size  6 posterior stabilized femoral component, and the size 38 patella are cemented in place and the patella is held with the clamp. The trial insert is placed and the knee held in full extension. The Exparel (20 ml mixed with 60 ml saline) is injected into the extensor mechanism, posterior capsule, medial and lateral gutters and subcutaneous tissues.  All extruded cement is removed and once the cement is hard the permanent 6 mm posterior stabilized rotating platform insert is placed into the tibial tray.      The wound is copiously irrigated with saline solution and the extensor mechanism closed over a hemovac drain with #1 V-loc suture. The tourniquet is released for a total tourniquet time of 37  minutes. Flexion against gravity is 130 degrees and the patella tracks normally. Subcutaneous tissue is closed with 2.0 vicryl and subcuticular with running 4.0 Monocryl. The incision is cleaned and dried and steri-strips and a bulky sterile dressing are applied. The limb is placed into a knee immobilizer and the patient is awakened and transported to recovery in stable condition.      Please note that a surgical assistant was a medical necessity for this procedure in order to perform it in a safe and expeditious manner. Surgical assistant was necessary to retract the ligaments and vital neurovascular structures to prevent injury to them and also necessary for proper positioning of the limb to allow for anatomic placement of the prosthesis.   Gus RankinFrank V. Ridhi Hoffert, MD    09/05/2016, 9:34 AM

## 2016-09-05 NOTE — Anesthesia Postprocedure Evaluation (Signed)
Anesthesia Post Note  Patient: Eddie NorthLori Madding  Procedure(s) Performed: Procedure(s) (LRB): LEFT TOTAL KNEE ARTHROPLASTY (Left)     Patient location during evaluation: PACU Anesthesia Type: General Level of consciousness: awake and alert Pain management: pain level controlled Vital Signs Assessment: post-procedure vital signs reviewed and stable Respiratory status: spontaneous breathing, nonlabored ventilation and respiratory function stable Cardiovascular status: blood pressure returned to baseline and stable Postop Assessment: no signs of nausea or vomiting Anesthetic complications: no    Last Vitals:  Vitals:   09/05/16 1115 09/05/16 1130  BP: 132/82 130/82  Pulse: 72 73  Resp: (!) 24 20  Temp:      Last Pain:  Vitals:   09/05/16 1100  TempSrc:   PainSc: Asleep                 Lowella CurbWarren Ray Miller

## 2016-09-05 NOTE — Anesthesia Procedure Notes (Signed)
Procedure Name: Intubation Date/Time: 09/05/2016 8:33 AM Performed by: Delphia GratesHANDLER, Saleen Peden Pre-anesthesia Checklist: Emergency Drugs available, Patient identified, Suction available and Patient being monitored Patient Re-evaluated:Patient Re-evaluated prior to inductionOxygen Delivery Method: Circle system utilized Preoxygenation: Pre-oxygenation with 100% oxygen Intubation Type: IV induction Ventilation: Two handed mask ventilation required Grade View: Grade I Tube type: Oral Tube size: 7.5 mm Number of attempts: 1 Airway Equipment and Method: Stylet Placement Confirmation: ETT inserted through vocal cords under direct vision,  positive ETCO2 and breath sounds checked- equal and bilateral Secured at: 21 cm Tube secured with: Tape Dental Injury: Teeth and Oropharynx as per pre-operative assessment

## 2016-09-05 NOTE — Evaluation (Signed)
Physical Therapy Evaluation Patient Details Name: Laura NorthLori [name] MRN: [id number] DOB: Jun 13, 1955 Today's Date: 09/05/2016   History of Present Illness  Pt is a 61 year old female s/p L TKA  Clinical Impression  Pt is s/p TKA resulting in the deficits listed below (see PT Problem List).  Pt will benefit from skilled PT to increase their independence and safety with mobility to allow discharge to the venue listed below.  Pt with increased difficulty with stand pivot to recliner POD #0.  Will continue to safely assist pt with mobility for plan of d/c home with family/friend assist.     Follow Up Recommendations Home health PT;Supervision/Assistance - 24 hour    Equipment Recommendations  Rolling walker with 5" wheels;3in1 (PT)    Recommendations for Other Services       Precautions / Restrictions Precautions Precautions: Fall;Knee Required Braces or Orthoses: Knee Immobilizer - Left Restrictions Weight Bearing Restrictions: No Other Position/Activity Restrictions: WBAT      Mobility  Bed Mobility Overal bed mobility: Needs Assistance Bed Mobility: Supine to Sit     Supine to sit: Min assist;HOB elevated     General bed mobility comments: assist for L LE, verbal cues for technique  Transfers Overall transfer level: Needs assistance Equipment used: Rolling walker (2 wheeled) Transfers: Sit to/from UGI CorporationStand;Stand Pivot Transfers Sit to Stand: Mod assist;From elevated surface Stand pivot transfers: Min assist       General transfer comment: pt requiring significant assist to steady, cues for BOS, tight pivot bed to recliner, pt with increased difficulty moving L LE, reports difficulty "feeling" LE, light touch intact at feet and thigh however pt denies feeling touch to lower leg and knee  Ambulation/Gait                Stairs            Wheelchair Mobility    Modified Rankin (Stroke Patients Only)       Balance                                             Pertinent Vitals/Pain Pain Assessment: No/denies pain (premedicated, repositioned)    Home Living Family/patient expects to be discharged to:: Private residence Living Arrangements: Spouse/significant other Available Help at Discharge: Family (available until [employer]hurs, friends thereafter) Type of Home: House Home Access: Stairs to enter Entrance Stairs-Rails: None Secretary/administratorntrance Stairs-Number of Steps: 2 Home Layout: Able to live on main level with bedroom/bathroom Home Equipment: None Additional Comments: father in law passed away last week, family to go to funeral on Thurs, friends to assist at home    Prior Function Level of Independence: Independent               Hand Dominance        Extremity/Trunk Assessment        Lower Extremity Assessment Lower Extremity Assessment: LLE deficits/detail LLE Deficits / Details: unable to perform SLR, maintained KI       Communication   Communication: No difficulties  Cognition Arousal/Alertness: Awake/alert Behavior During Therapy: WFL for tasks assessed/performed Overall Cognitive Status: Within Functional Limits for tasks assessed                                        General Comments  Exercises     Assessment/Plan    PT Assessment Patient needs continued PT services  PT Problem List Decreased strength;Decreased range of motion;Decreased mobility;Decreased balance;Decreased knowledge of use of DME;Pain;Decreased knowledge of precautions       PT Treatment Interventions Functional mobility training;Stair training;DME instruction;Therapeutic exercise;Gait training;Therapeutic activities;Patient/family education    PT Goals (Current goals can be found in the Care Plan section)  Acute Rehab PT Goals PT Goal Formulation: With patient Time For Goal Achievement: 09/10/16 Potential to Achieve Goals: Good    Frequency 7X/week   Barriers to discharge        Co-evaluation                AM-PAC PT "6 Clicks" Daily Activity  Outcome Measure Difficulty turning over in bed (including adjusting bedclothes, sheets and blankets)?: Total Difficulty moving from lying on back to sitting on the side of the bed? : Total Difficulty sitting down on and standing up from a chair with arms (e.g., wheelchair, bedside commode, etc,.)?: Total Help needed moving to and from a bed to chair (including a wheelchair)?: A Lot Help needed walking in hospital room?: Total Help needed climbing 3-5 steps with a railing? : Total 6 Click Score: 7    End of Session Equipment Utilized During Treatment: Gait belt;Left knee immobilizer Activity Tolerance: Patient tolerated treatment well Patient left: in chair;with call bell/phone within reach Nurse Communication: Mobility status (NT aware +2 for safety) PT Visit Diagnosis: Other abnormalities of gait and mobility (R26.89)    Time: 1610-9604 PT Time Calculation (min) (ACUTE ONLY): 21 min   Charges:   PT Evaluation $PT Eval Low Complexity: 1 Procedure     PT G CodesZenovia Jarred, PT, DPT 09/05/2016 Pager: [phone]   Maida Sale E 09/05/2016, 4:04 PM

## 2016-09-05 NOTE — Progress Notes (Addendum)
Assisted Dr. Cristela BlueKyle Jackson with left, ultrasound guided adductor canal block. Side rails up, monitors on throughout procedure. See vital signs in flow sheet. Tolerated Procedure well.

## 2016-09-05 NOTE — Interval H&P Note (Signed)
History and Physical Interval Note:  09/05/2016 6:25 AM  Laura NorthLori Guttierrez  has presented today for surgery, with the diagnosis of Osteoarthritis Left Knee  The various methods of treatment have been discussed with the patient and family. After consideration of risks, benefits and other options for treatment, the patient has consented to  Procedure(s): LEFT TOTAL KNEE ARTHROPLASTY (Left) as a surgical intervention .  The patient's history has been reviewed, patient examined, no change in status, stable for surgery.  I have reviewed the patient's chart and labs.  Questions were answered to the patient's satisfaction.     Loanne DrillingALUISIO,Charlett Merkle V

## 2016-09-05 NOTE — Anesthesia Preprocedure Evaluation (Signed)
Anesthesia Evaluation  Patient identified by MRN, date of birth, ID band Patient awake    Reviewed: Allergy & Precautions, H&P , Patient's Chart, lab work & pertinent test results, reviewed documented beta blocker date and time   Airway Mallampati: II  TM Distance: >3 FB Neck ROM: full    Dental no notable dental hx.    Pulmonary    Pulmonary exam normal breath sounds clear to auscultation       Cardiovascular hypertension, On Medications  Rhythm:regular Rate:Normal     Neuro/Psych    GI/Hepatic   Endo/Other  Morbid obesity  Renal/GU      Musculoskeletal   Abdominal   Peds  Hematology   Anesthesia Other Findings   Reproductive/Obstetrics                             Anesthesia Physical Anesthesia Plan  ASA: III  Anesthesia Plan: General   Post-op Pain Management:    Induction: Intravenous  PONV Risk Score and Plan:   Airway Management Planned: Oral ETT  Additional Equipment:   Intra-op Plan:   Post-operative Plan: Extubation in OR  Informed Consent: I have reviewed the patients History and Physical, chart, labs and discussed the procedure including the risks, benefits and alternatives for the proposed anesthesia with the patient or authorized representative who has indicated his/her understanding and acceptance.   Dental Advisory Given  Plan Discussed with: CRNA and Surgeon  Anesthesia Plan Comments: (  )        Anesthesia Quick Evaluation

## 2016-09-05 NOTE — H&P (View-Only) (Signed)
Laura [name] DOB: 1955/09/07 Married / Language: English / Race: Black or African American Female Date of Admission:  09/05/2016 CC:  Left Knee Pain History of Present Illness  The patient is a 61 year old female who comes in for a preoperative History and Physical. The patient is scheduled for a left total knee arthroplasty to be performed by Dr. Gus Rankin. Aluisio, MD at [facility] on 09-05-2016. The patient is a 61 year old female who presented for follow up of their knee. The patient is being followed for their left (worse than right) knee pain and osteoarthritis. They are now months out from Euflexxa series. Symptoms reported include: pain, swelling, aching, stiffness, locking, catching, instability, difficulty ambulating and difficulty arising from chair. The patient feels that they are doing poorly and report their pain level to be moderate to severe. The following medication has been used for pain control: Tylenol (patient cannot take NSAIDs). The patient has not gotten any relief of their symptoms with viscosupplementation (She states the right knee is better, but feels like the left knee is worse. She is wondering if she has something else going on, other than arthritis).Unfortunately, her left knee is getting progressively worse over time. It is bothering her all times now. The injections did not help at all. They did help her right knee, which is doing well. Left knee is limiting what she can and cannot do. She has advanced end-stage arthritis of that knee with progressive pain and dysfunction. Cortisone helps her for six weeks usually. We will go ahead and proceed with a total knee arthroplasty. They have been treated conservatively in the past for the above stated problem and despite conservative measures, they continue to have progressive pain and severe functional limitations and dysfunction. They have failed non-operative management including home exercise, medications, and  injections. It is felt that they would benefit from undergoing total joint replacement. Risks and benefits of the procedure have been discussed with the patient and they elect to proceed with surgery. There are no active contraindications to surgery such as ongoing infection or rapidly progressive neurological disease.  Problem List/Past Medical  Primary osteoarthritis of both knees (M17.0)  High blood pressure  Osteoarthritis  Anemia  Blood Clot  Vitamin D Deficiency  Exercise-induced Asthma  Herpes Simplex Vulvovaginitis  Morbid Obesity  Edema   Allergies No Known Drug Allergies  Family History Cancer  mother and father Depression  mother First Degree Relatives  reported Hypertension  Mother. mother  Social History Alcohol use  current drinker; drinks wine; only occasionally per week Children  2 Current work status  working full time Drug/Alcohol Rehab (Currently)  no Drug/Alcohol Rehab (Previously)  no Exercise  Exercises weekly; does gym / weights Illicit drug use  no Living situation  live with spouse Marital status  married Never smoker  Number of flights of stairs before winded  1 Pain Contract  no Tobacco / smoke exposure  no Tobacco use  Never smoker. never smoker  Medication History Tylenol (Oral as needed) Specific strength unknown - Active. Xarelto (Oral) Specific strength unknown - Active. Vitamin D (50000UNIT Capsule, Oral) Active. Carvedilol (Oral) Specific strength unknown - Active. HydroCHLOROthiazide (25MG  Tablet, Oral) Active.  Past Surgical History Cesarean Delivery  2 times; 1993, 1997 Cataract Extraction-Left   Review of Systems  General Not Present- Chills, Fatigue, Fever, Memory Loss, Night Sweats, Weight Gain and Weight Loss. Skin Not Present- Eczema, Hives, Itching, Lesions and Rash. HEENT Not Present- Dentures, Double Vision,  Headache, Hearing Loss, Tinnitus and Visual Loss. Respiratory Not Present-  Allergies, Chronic Cough, Coughing up blood, Shortness of breath at rest and Shortness of breath with exertion. Cardiovascular Not Present- Chest Pain, Difficulty Breathing Lying Down, Murmur, Palpitations, Racing/skipping heartbeats and Swelling. Gastrointestinal Not Present- Abdominal Pain, Bloody Stool, Constipation, Diarrhea, Difficulty Swallowing, Heartburn, Jaundice, Loss of appetitie, Nausea and Vomiting. Female Genitourinary Not Present- Blood in Urine, Discharge, Flank Pain, Incontinence, Painful Urination, Urgency, Urinary frequency, Urinary Retention, Urinating at Night and Weak urinary stream. Musculoskeletal Present- Joint Pain. Not Present- Back Pain, Joint Swelling, Morning Stiffness, Muscle Pain, Muscle Weakness and Spasms. Neurological Not Present- Blackout spells, Difficulty with balance, Dizziness, Paralysis, Tremor and Weakness. Psychiatric Not Present- Insomnia.  Vitals  Weight: 275 lb Height: 67in Body Surface Area: 2.32 m Body Mass Index: 43.07 kg/m  Pulse: 84 (Regular)  BP: 126/88 (Sitting, Left Arm, Standard)  Physical Exam  General Mental Status -Alert, cooperative and good historian. General Appearance-pleasant, Not in acute distress. Orientation-Oriented X3. Build & Nutrition-Well nourished and Well developed.  Head and Neck Head-normocephalic, atraumatic . Neck Global Assessment - supple, no bruit auscultated on the right, no bruit auscultated on the left.  Eye Pupil - Bilateral-Regular and Round. Motion - Bilateral-EOMI.  Chest and Lung Exam Auscultation Breath sounds - clear at anterior chest wall and clear at posterior chest wall. Adventitious sounds - No Adventitious sounds.  Cardiovascular Auscultation Rhythm - Regular rate and rhythm. Heart Sounds - S1 WNL and S2 WNL. Murmurs & Other Heart Sounds - Auscultation of the heart reveals - No Murmurs.  Abdomen Palpation/Percussion Tenderness - Abdomen is non-tender to  palpation. Rigidity (guarding) - Abdomen is soft. Auscultation Auscultation of the abdomen reveals - Bowel sounds normal.  Female Genitourinary Note: Not done, not pertinent to present illness   Musculoskeletal Note: Her left knee shows no swelling. Range about 5 to 120, marked crepitus on range of motion. Tender medial greater than lateral, with no instability.  Assessment & Plan  Primary osteoarthritis of both knees (M17.0)  Note:Surgical Plans: Left Total Knee Replacement  Disposition: Home with family  PCP: Dr. Laurann Montanaynthia White - Patient has been seen preoperatively and felt to be stable for surgery. "Anticoagulation recommendations per Dr. Cyndie ChimeGranfortuna". Hem/Onc: Dr. Cyndie ChimeGranfortuna - Patient has been seen preoperatively and felt to be stable for surgery. "Hold Xarelto 24 hours before surgery. Resume next day post op".  Topical TXA - PE following a flight 2007  Anesthesia Issues: None  Patient was instructed on what medications to stop prior to surgery.  Signed electronically by Lauraine RinneAlexzandrew L Dhruv Christina, III PA-C

## 2016-09-06 LAB — CBC
HCT: 33.4 % — ABNORMAL LOW (ref 36.0–46.0)
HEMOGLOBIN: 10.5 g/dL — AB (ref 12.0–15.0)
MCH: 29.2 pg (ref 26.0–34.0)
MCHC: 31.4 g/dL (ref 30.0–36.0)
MCV: 92.8 fL (ref 78.0–100.0)
Platelets: 267 10*3/uL (ref 150–400)
RBC: 3.6 MIL/uL — ABNORMAL LOW (ref 3.87–5.11)
RDW: 14.3 % (ref 11.5–15.5)
WBC: 12.5 10*3/uL — ABNORMAL HIGH (ref 4.0–10.5)

## 2016-09-06 LAB — BASIC METABOLIC PANEL
Anion gap: 6 (ref 5–15)
BUN: 17 mg/dL (ref 6–20)
CHLORIDE: 101 mmol/L (ref 101–111)
CO2: 30 mmol/L (ref 22–32)
CREATININE: 0.83 mg/dL (ref 0.44–1.00)
Calcium: 8.3 mg/dL — ABNORMAL LOW (ref 8.9–10.3)
GFR calc Af Amer: >60 mL/min (ref >60)
GFR calc non Af Amer: >60 mL/min (ref >60)
Glucose, Bld: 139 mg/dL — ABNORMAL HIGH (ref 65–99)
Potassium: 3.6 mmol/L (ref 3.5–5.1)
SODIUM: 137 mmol/L (ref 135–145)

## 2016-09-06 MED ORDER — OXYCODONE HCL 5 MG PO TABS: 5.0000 mg | ORAL_TABLET | ORAL | 0 refills | Status: DC | PRN

## 2016-09-06 MED ORDER — METHOCARBAMOL 500 MG PO TABS: 500.0000 mg | ORAL_TABLET | Freq: Four times a day (QID) | ORAL | 0 refills | Status: DC | PRN

## 2016-09-06 MED ORDER — TRAMADOL HCL 50 MG PO TABS: 50.0000 mg | ORAL_TABLET | Freq: Four times a day (QID) | ORAL | 0 refills | Status: DC | PRN

## 2016-09-06 MED ORDER — SODIUM CHLORIDE 0.9 % IV BOLUS (SEPSIS)
250.0000 mL | Freq: Once | INTRAVENOUS | Status: AC
  Administered 2016-09-06: 250 mL via INTRAVENOUS

## 2016-09-06 NOTE — Progress Notes (Signed)
   09/06/16 1600  PT Visit Information  Last PT Received On 09/06/16  Assistance Needed +1  History of Present Illness Pt is a 61 year old female s/p L TKA  Subjective Data  Subjective Pt ambulated again in hallway and assisted to bathroom prior to returning to supine.  Precautions  Precautions Fall;Knee  Required Braces or Orthoses Knee Immobilizer - Left  Restrictions  Other Position/Activity Restrictions WBAT  Pain Assessment  Pain Assessment 0-10  Pain Score 4  Pain Location L knee  Pain Descriptors / Indicators Sore;Aching  Pain Intervention(s) Limited activity within patient's tolerance;Monitored during session;Repositioned;Ice applied  Cognition  Arousal/Alertness Awake/alert  Behavior During Therapy WFL for tasks assessed/performed  Overall Cognitive Status Within Functional Limits for tasks assessed  Bed Mobility  Overal bed mobility Needs Assistance  Bed Mobility Sit to Supine  Sit to supine Min assist  General bed mobility comments assist for L LE  Transfers  Overall transfer level Needs assistance  Equipment used Rolling walker (2 wheeled)  Transfers Sit to/from Stand  Sit to Stand From elevated surface;Min assist  General transfer comment pt requiring assist to steady, cues for BOS, verbal cues for UE and LE positioning  Ambulation/Gait  Ambulation/Gait assistance Min guard  Ambulation Distance (Feet) 160 Feet  Assistive device Rolling walker (2 wheeled)  Gait Pattern/deviations Step-to pattern;Decreased stance time - left;Antalgic  General Gait Details verbal cues for sequence, RW positioning, step length  PT - End of Session  Equipment Utilized During Treatment Gait belt;Left knee immobilizer  Activity Tolerance Patient tolerated treatment well  Patient left in bed;with call bell/phone within reach;with family/visitor present  PT - Assessment/Plan  PT Plan Current plan remains appropriate  PT Visit Diagnosis Other abnormalities of gait and mobility (R26.89)   PT Frequency (ACUTE ONLY) 7X/week  Follow Up Recommendations Home health PT;Supervision/Assistance - 24 hour  PT equipment Rolling walker with 5" wheels;3in1 (PT)  AM-PAC PT "6 Clicks" Daily Activity Outcome Measure  Difficulty turning over in bed (including adjusting bedclothes, sheets and blankets)? 1  Difficulty moving from lying on back to sitting on the side of the bed?  1  Difficulty sitting down on and standing up from a chair with arms (e.g., wheelchair, bedside commode, etc,.)? 1  Help needed moving to and from a bed to chair (including a wheelchair)? 3  Help needed walking in hospital room? 3  Help needed climbing 3-5 steps with a railing?  3  6 Click Score 12  Mobility G Code  CL  PT Goal Progression  Progress towards PT goals Progressing toward goals  PT Time Calculation  PT Start Time (ACUTE ONLY) 1406  PT Stop Time (ACUTE ONLY) 1439  PT Time Calculation (min) (ACUTE ONLY) 33 min  PT General Charges  $$ ACUTE PT VISIT 1 Procedure  PT Treatments  $Gait Training 23-37 mins   Zenovia JarredKati Damani Kelemen, PT, DPT 09/06/2016 Pager: [phone]

## 2016-09-06 NOTE — Discharge Instructions (Addendum)
° °Dr. Frank Aluisio °Total Joint Specialist °[facility] °[address] °[place], [place] [address] °[phone] ° °TOTAL KNEE REPLACEMENT POSTOPERATIVE DIRECTIONS ° °Knee Rehabilitation, Guidelines Following Surgery  °Results after knee surgery are often greatly improved when you follow the exercise, range of motion and muscle strengthening exercises prescribed by your doctor. Safety measures are also important to protect the knee from further injury. Any time any of these exercises cause you to have increased pain or swelling in your knee joint, decrease the amount until you are comfortable again and slowly increase them. If you have problems or questions, call your caregiver or physical therapist for advice.  ° °HOME CARE INSTRUCTIONS  °Remove items at home which could result in a fall. This includes throw rugs or furniture in walking pathways.  °· ICE to the affected knee every three hours for 30 minutes at a time and then as needed for pain and swelling.  Continue to use ice on the knee for pain and swelling from surgery. You may notice swelling that will progress down to the foot and ankle.  This is normal after surgery.  Elevate the leg when you are not up walking on it.   °· Continue to use the breathing machine which will help keep your temperature down.  It is common for your temperature to cycle up and down following surgery, especially at night when you are not up moving around and exerting yourself.  The breathing machine keeps your lungs expanded and your temperature down. °· Do not place pillow under knee, focus on keeping the knee straight while resting ° °DIET °You may resume your previous home diet once your are discharged from the hospital. ° °DRESSING / WOUND CARE / SHOWERING °You may shower 3 days after surgery, but keep the wounds dry during showering.  You may use an occlusive plastic wrap (Press'n Seal for example), NO SOAKING/SUBMERGING IN THE BATHTUB.  If the  bandage gets wet, change with a clean dry gauze.  If the incision gets wet, pat the wound dry with a clean towel. °You may start showering once you are discharged home but do not submerge the incision under water. Just pat the incision dry and apply a dry gauze dressing on daily. °Change the surgical dressing daily and reapply a dry dressing each time. ° °ACTIVITY °Walk with your walker as instructed. °Use walker as long as suggested by your caregivers. °Avoid periods of inactivity such as sitting longer than an hour when not asleep. This helps prevent blood clots.  °You may resume a sexual relationship in one month or when given the OK by your doctor.  °You may return to work once you are cleared by your doctor.  °Do not drive a car for 6 weeks or until released by you surgeon.  °Do not drive while taking narcotics. ° °WEIGHT BEARING °Weight bearing as tolerated with assist device (walker, cane, etc) as directed, use it as long as suggested by your surgeon or therapist, typically at least 4-6 weeks. ° °POSTOPERATIVE CONSTIPATION PROTOCOL °Constipation - defined medically as fewer than three stools per week and severe constipation as less than one stool per week. ° °One of the most common issues patients have following surgery is constipation.  Even if you have a regular bowel pattern at home, your normal regimen is likely to be disrupted due to multiple reasons following surgery.  Combination of anesthesia, postoperative narcotics, change in appetite and fluid intake all can affect your bowels.    In order to avoid complications following surgery, here are some recommendations in order to help you during your recovery period. ° °Colace (docusate) - Pick up an over-the-counter form of Colace or another stool softener and take twice a day as long as you are requiring postoperative pain medications.  Take with a full glass of water daily.  If you experience loose stools or diarrhea, hold the colace until you stool forms  back up.  If your symptoms do not get better within 1 week or if they get worse, check with your doctor. ° °Dulcolax (bisacodyl) - Pick up over-the-counter and take as directed by the product packaging as needed to assist with the movement of your bowels.  Take with a full glass of water.  Use this product as needed if not relieved by Colace only.  ° °MiraLax (polyethylene glycol) - Pick up over-the-counter to have on hand.  MiraLax is a solution that will increase the amount of water in your bowels to assist with bowel movements.  Take as directed and can mix with a glass of water, juice, soda, coffee, or tea.  Take if you go more than two days without a movement. °Do not use MiraLax more than once per day. Call your doctor if you are still constipated or irregular after using this medication for 7 days in a row. ° °If you continue to have problems with postoperative constipation, please contact the office for further assistance and recommendations.  If you experience "the worst abdominal pain ever" or develop nausea or vomiting, please contact the office immediatly for further recommendations for treatment. ° °ITCHING ° If you experience itching with your medications, try taking only a single pain pill, or even half a pain pill at a time.  You can also use Benadryl over the counter for itching or also to help with sleep.  ° °TED HOSE STOCKINGS °Wear the elastic stockings on both legs for three weeks following surgery during the day but you may remove then at night for sleeping. ° °MEDICATIONS °See your medication summary on the “After Visit Summary” that the nursing staff will review with you prior to discharge.  You may have some home medications which will be placed on hold until you complete the course of blood thinner medication.  It is important for you to complete the blood thinner medication as prescribed by your surgeon.  Continue your approved medications as instructed at time of  discharge. ° °PRECAUTIONS °If you experience chest pain or shortness of breath - call 911 immediately for transfer to the hospital emergency department.  °If you develop a fever greater that 101 F, purulent drainage from wound, increased redness or drainage from wound, foul odor from the wound/dressing, or calf pain - CONTACT YOUR SURGEON.   °                                                °FOLLOW-UP APPOINTMENTS °Make sure you keep all of your appointments after your operation with your surgeon and caregivers. You should call the office at the above phone number and make an appointment for approximately two weeks after the date of your surgery or on the date instructed by your surgeon outlined in the "After Visit Summary". ° ° °RANGE OF MOTION AND STRENGTHENING EXERCISES  °Rehabilitation of the knee is important following a knee injury or   an operation. After just a few days of immobilization, the muscles of the thigh which control the knee become weakened and shrink (atrophy). Knee exercises are designed to build up the tone and strength of the thigh muscles and to improve knee motion. Often times heat used for twenty to thirty minutes before working out will loosen up your tissues and help with improving the range of motion but do not use heat for the first two weeks following surgery. These exercises can be done on a training (exercise) mat, on the floor, on a table or on a bed. Use what ever works the best and is most comfortable for you Knee exercises include:  Leg Lifts - While your knee is still immobilized in a splint or cast, you can do straight leg raises. Lift the leg to 60 degrees, hold for 3 sec, and slowly lower the leg. Repeat 10-20 times 2-3 times daily. Perform this exercise against resistance later as your knee gets better.  Quad and Hamstring Sets - Tighten up the muscle on the front of the thigh (Quad) and hold for 5-10 sec. Repeat this 10-20 times hourly. Hamstring sets are done by pushing the  foot backward against an object and holding for 5-10 sec. Repeat as with quad sets.   Leg Slides: Lying on your back, slowly slide your foot toward your buttocks, bending your knee up off the floor (only go as far as is comfortable). Then slowly slide your foot back down until your leg is flat on the floor again.  Angel Wings: Lying on your back spread your legs to the side as far apart as you can without causing discomfort.  A rehabilitation program following serious knee injuries can speed recovery and prevent re-injury in the future due to weakened muscles. Contact your doctor or a physical therapist for more information on knee rehabilitation.   IF YOU ARE TRANSFERRED TO A SKILLED REHAB FACILITY If the patient is transferred to a skilled rehab facility following release from the hospital, a list of the current medications will be sent to the facility for the patient to continue.  When discharged from the skilled rehab facility, please have the facility set up the patient's Home Health Physical Therapy prior to being released. Also, the skilled facility will be responsible for providing the patient with their medications at time of release from the facility to include their pain medication, the muscle relaxants, and their blood thinner medication. If the patient is still at the rehab facility at time of the two week follow up appointment, the skilled rehab facility will also need to assist the patient in arranging follow up appointment in our office and any transportation needs.  MAKE SURE YOU:  Understand these instructions.  Get help right away if you are not doing well or get worse.    Pick up stool softner and laxative for home use following surgery while on pain medications. Do not submerge incision under water. Please use good hand washing techniques while changing dressing each day. May shower starting three days after surgery. Please use a clean towel to pat the incision dry following  showers. Continue to use ice for pain and swelling after surgery. Do not use any lotions or creams on the incision until instructed by your surgeon.  Resume the full dose Xarelto 20 mg at home on Thursday following discharge.   Information on my medicine - XARELTO (Rivaroxaban)  This medication education was reviewed with me or my healthcare representative as part  of my discharge preparation.  The pharmacist that spoke with me during my hospital stay was:  Gladstone Pih, Student-PharmD  Why was Xarelto prescribed for you? Xarelto was prescribed for you to reduce the risk of blood clots forming after orthopedic surgery. The medical term for these abnormal blood clots is venous thromboembolism (VTE).  What do you need to know about xarelto ? Take your Xarelto ONCE DAILY at the same time every day. You may take it either with or without food.  If you have difficulty swallowing the tablet whole, you may crush it and mix in applesauce just prior to taking your dose.  Take Xarelto exactly as prescribed by your doctor and DO NOT stop taking Xarelto without talking to the doctor who prescribed the medication.  Stopping without other VTE prevention medication to take the place of Xarelto may increase your risk of developing a clot.  After discharge, you should have regular check-up appointments with your healthcare provider that is prescribing your Xarelto.    What do you do if you miss a dose? If you miss a dose, take it as soon as you remember on the same day then continue your regularly scheduled once daily regimen the next day. Do not take two doses of Xarelto on the same day.   Important Safety Information A possible side effect of Xarelto is bleeding. You should call your healthcare provider right away if you experience any of the following: ? Bleeding from an injury or your nose that does not stop. ? Unusual colored urine (red or dark brown) or unusual colored stools (red or  black). ? Unusual bruising for unknown reasons. ? A serious fall or if you hit your head (even if there is no bleeding).  Some medicines may interact with Xarelto and might increase your risk of bleeding while on Xarelto. To help avoid this, consult your healthcare provider or pharmacist prior to using any new prescription or non-prescription medications, including herbals, vitamins, non-steroidal anti-inflammatory drugs (NSAIDs) and supplements.  This website has more information on Xarelto: VisitDestination.com.br.

## 2016-09-06 NOTE — Progress Notes (Signed)
Discharge plan:  Pt states [facility] and has [facility] appointment for 6/29. Pt has orders for 3in1 and RW. [facility] rep called for DME. Sandford Crazeora Camdan Burdi RN,BSN,NCM [phone]

## 2016-09-06 NOTE — Progress Notes (Signed)
   Subjective: 1 Day Post-Op Procedure(s) (LRB): LEFT TOTAL KNEE ARTHROPLASTY (Left) Patient was doing well on morning rounds Patient seen in rounds for Dr. Lequita HaltAluisio. Patient was well, and has had no acute complaints or problems Started therapy today and walked 80 feet Plan is to go Home after hospital stay.  Objective: Vital signs in last 24 hours: Temp:  [97.4 F (36.3 C)-98.8 F (37.1 C)] 98.5 F (36.9 C) (06/26 1520) Pulse Rate:  [65-80] 80 (06/26 1520) Resp:  [15-18] 16 (06/26 1520) BP: (101-130)/(59-81) 122/68 (06/26 1520) SpO2:  [97 %-100 %] 100 % (06/26 1520)  Intake/Output from previous day:  Intake/Output Summary (Last 24 hours) at 09/06/16 2127 Last data filed at 09/06/16 1837  Gross per 24 hour  Intake          2118.75 ml  Output             2105 ml  Net            13.75 ml    Intake/Output this shift: No intake/output data recorded.  Labs:  Recent Labs  09/06/16 0408  HGB 10.5*    Recent Labs  09/06/16 0408  WBC 12.5*  RBC 3.60*  HCT 33.4*  PLT 267    Recent Labs  09/06/16 0408  NA 137  K 3.6  CL 101  CO2 30  BUN 17  CREATININE 0.83  GLUCOSE 139*  CALCIUM 8.3*    Recent Labs  09/05/16 0709  INR 1.21    EXAM General - Patient is Alert, Appropriate and Oriented Extremity - Neurovascular intact Sensation intact distally Intact pulses distally Dorsiflexion/Plantar flexion intact Dressing - dressing C/D/I Motor Function - intact, moving foot and toes well on exam.  Hemovac pulled without difficulty.  Past Medical History:  Diagnosis Date  . Edema   . 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 Herpes   . High blood pressure   . History of blood clots   . Obesity   . Partial tear of Achilles tendon   . Pneumonia   . Pulmonary embolism (HCC)   . 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 Vitamin D deficiency     Assessment/Plan: 1 Day Post-Op Procedure(s)  (LRB): LEFT TOTAL KNEE ARTHROPLASTY (Left) Principal Problem:   Osteoarthritis of knees, bilateral Active Problems:   OA (osteoarthritis) of knee  Estimated body mass index is 45.19 kg/m as calculated from the following:   Height as of this encounter: 5' 6.5" (1.689 m).   Weight as of this encounter: 128.9 kg (284 lb 4 oz). Up with therapy Plan for discharge tomorrow Discharge home - straight to outpatient  DVT Prophylaxis - Xarelto Weight-Bearing as tolerated to left leg D/C O2 and Pulse OX and try on Room Air  Avel Peacerew Rawan Riendeau, PA-C Orthopaedic Surgery 09/06/2016, 9:27 PM

## 2016-09-06 NOTE — Progress Notes (Signed)
Physical Therapy Treatment Patient Details Name: Laura NorthLori [name] MRN: [id number] DOB: 1955/05/27 Today's Date: 09/06/2016    History of Present Illness Pt is a 61 year old female s/p L TKA    PT Comments    Pt ambulated short distance in hallway and performed exercises.  Pt with bleeding at hemovac site so RN notified and reinforced dressing.  Follow Up Recommendations  Home health PT;Supervision/Assistance - 24 hour     Equipment Recommendations  Rolling walker with 5" wheels;3in1 (PT)    Recommendations for Other Services       Precautions / Restrictions Precautions Precautions: Fall;Knee Required Braces or Orthoses: Knee Immobilizer - Left Restrictions Other Position/Activity Restrictions: WBAT    Mobility  Bed Mobility Overal bed mobility: Needs Assistance Bed Mobility: Supine to Sit     Supine to sit: HOB elevated;Min guard     General bed mobility comments: verbal cues for self assist  Transfers Overall transfer level: Needs assistance Equipment used: Rolling walker (2 wheeled) Transfers: Sit to/from Stand Sit to Stand: From elevated surface;Min assist         General transfer comment: pt requiring assist to steady, cues for BOS, verbal cues for UE and LE positioning  Ambulation/Gait Ambulation/Gait assistance: Min guard Ambulation Distance (Feet): 80 Feet Assistive device: Rolling walker (2 wheeled) Gait Pattern/deviations: Step-to pattern;Decreased stance time - left;Antalgic     General Gait Details: verbal cues for sequence, RW positioning, step length   Stairs            Wheelchair Mobility    Modified Rankin (Stroke Patients Only)       Balance                                            Cognition Arousal/Alertness: Awake/alert Behavior During Therapy: WFL for tasks assessed/performed Overall Cognitive Status: Within Functional Limits for tasks assessed                                         Exercises Total Joint Exercises Ankle Circles/Pumps: AROM;Both;10 reps Quad Sets: AROM;Left;10 reps Heel Slides: AAROM;Left;10 reps Hip ABduction/ADduction: AROM;Left;10 reps Straight Leg Raises: AAROM;Left;10 reps    General Comments        Pertinent Vitals/Pain Pain Assessment: 0-10 Pain Score: 4  Pain Location: L knee Pain Descriptors / Indicators: Sore;Aching Pain Intervention(s): Limited activity within patient's tolerance;Monitored during session;Repositioned;Ice applied    Home Living                      Prior Function            PT Goals (current goals can now be found in the care plan section) Progress towards PT goals: Progressing toward goals    Frequency           PT Plan Current plan remains appropriate    Co-evaluation              AM-PAC PT "6 Clicks" Daily Activity  Outcome Measure  Difficulty turning over in bed (including adjusting bedclothes, sheets and blankets)?: Total Difficulty moving from lying on back to sitting on the side of the bed? : Total Difficulty sitting down on and standing up from a chair with arms (e.g., wheelchair, bedside commode, etc,.)?: Total Help needed  moving to and from a bed to chair (including a wheelchair)?: A Little Help needed walking in hospital room?: A Little Help needed climbing 3-5 steps with a railing? : A Lot 6 Click Score: 11    End of Session Equipment Utilized During Treatment: Gait belt;Left knee immobilizer Activity Tolerance: Patient limited by fatigue Patient left: in chair;with call bell/phone within reach Nurse Communication:  (RN notified of hemovac site bleeding and reinforced) PT Visit Diagnosis: Other abnormalities of gait and mobility (R26.89)     Time: 1024-1050 PT Time Calculation (min) (ACUTE ONLY): 26 min  Charges:  $Gait Training: 8-22 mins $Therapeutic Exercise: 8-22 mins                    G Codes:       Zenovia Jarred, PT, DPT 09/06/2016 Pager:  [phone]  Maida Sale E 09/06/2016, 1:52 PM

## 2016-09-06 NOTE — Discharge Summary (Signed)
Physician Discharge Summary   Patient ID: Laura [name] MRN: [id number] DOB/AGE: [birth year] 61 y.o.  Admit date: 09/05/2016 Discharge date: 09/08/2016  Primary Diagnosis:  Osteoarthritis  Left knee(s)  Admission Diagnoses:  Past Medical History:  Diagnosis Date  . Edema   . 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 Herpes   . High blood pressure   . History of blood clots   . Obesity   . Partial tear of Achilles tendon   . Pneumonia   . Pulmonary embolism ([place])   . 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 Vitamin D deficiency    Discharge Diagnoses:   Principal Problem:   Osteoarthritis of knees, bilateral Active Problems:   OA (osteoarthritis) of knee  Estimated body mass index is 45.19 kg/m as calculated from the following:   Height as of this encounter: 5' 6.5" (1.689 m).   Weight as of this encounter: 128.9 kg (284 lb 4 oz).  Procedure:  Procedure(s) (LRB): LEFT TOTAL KNEE ARTHROPLASTY (Left)   Consults: None  HPI: Laura [name] is a 61 y.o. year old female with end stage OA of her left knee with progressively worsening pain and dysfunction. She has constant pain, with activity and at rest and significant functional deficits with difficulties even with ADLs. She has had extensive non-op management including analgesics, injections of cortisone and viscosupplements, and home exercise program, but remains in significant pain with significant dysfunction. Radiographs show bone on bone arthritis medial and patellofemoral. She presents now for left Total Knee Arthroplasty.    Laboratory Data: Admission on 09/05/2016  Component Date Value Ref Range Status  . Prothrombin Time 09/05/2016 15.4* 11.4 - 15.2 seconds Final  . INR 09/05/2016 1.21   Final  . WBC 09/06/2016 12.5* 4.0 - 10.5 K/uL Final  . RBC 09/06/2016 3.60* 3.87 - 5.11 MIL/uL Final  . Hemoglobin 09/06/2016 10.5* 12.0 - 15.0 g/dL Final  . HCT 09/06/2016  33.4* 36.0 - 46.0 % Final  . MCV 09/06/2016 92.8  78.0 - 100.0 fL Final  . MCH 09/06/2016 29.2  26.0 - 34.0 pg Final  . MCHC 09/06/2016 31.4  30.0 - 36.0 g/dL Final  . RDW 09/06/2016 14.3  11.5 - 15.5 % Final  . Platelets 09/06/2016 267  150 - 400 K/uL Final  . Sodium 09/06/2016 137  135 - 145 mmol/L Final  . Potassium 09/06/2016 3.6  3.5 - 5.1 mmol/L Final  . Chloride 09/06/2016 101  101 - 111 mmol/L Final  . CO2 09/06/2016 30  22 - 32 mmol/L Final  . Glucose, Bld 09/06/2016 139* 65 - 99 mg/dL Final  . BUN 09/06/2016 17  6 - 20 mg/dL Final  . Creatinine, Ser 09/06/2016 0.83  0.44 - 1.00 mg/dL Final  . Calcium 09/06/2016 8.3* 8.9 - 10.3 mg/dL Final  . GFR calc non Af Amer 09/06/2016 >60  >60 mL/min Final  . GFR calc Af Amer 09/06/2016 >60  >60 mL/min Final   Comment: (NOTE) The eGFR has been calculated using the CKD EPI equation. This calculation has not been validated in all clinical situations. eGFR's persistently <60 mL/min signify possible Chronic Kidney Disease.   . Anion gap 09/06/2016 6  5 - 15 Final  . WBC 09/07/2016 14.7* 4.0 - 10.5 K/uL Final  . RBC 09/07/2016 3.71* 3.87 - 5.11 MIL/uL Final  . Hemoglobin 09/07/2016 10.8* 12.0 - 15.0 g/dL Final  . HCT 09/07/2016 34.7* 36.0 - 46.0 % Final  .  MCV 09/07/2016 93.5  78.0 - 100.0 fL Final  . MCH 09/07/2016 29.1  26.0 - 34.0 pg Final  . MCHC 09/07/2016 31.1  30.0 - 36.0 g/dL Final  . RDW 09/07/2016 14.5  11.5 - 15.5 % Final  . Platelets 09/07/2016 276  150 - 400 K/uL Final  . Sodium 09/07/2016 134* 135 - 145 mmol/L Final  . Potassium 09/07/2016 3.6  3.5 - 5.1 mmol/L Final  . Chloride 09/07/2016 95* 101 - 111 mmol/L Final  . CO2 09/07/2016 31  22 - 32 mmol/L Final  . Glucose, Bld 09/07/2016 129* 65 - 99 mg/dL Final  . BUN 09/07/2016 13  6 - 20 mg/dL Final  . Creatinine, Ser 09/07/2016 0.85  0.44 - 1.00 mg/dL Final  . Calcium 09/07/2016 8.6* 8.9 - 10.3 mg/dL Final  . GFR calc non Af Amer 09/07/2016 >60  >60 mL/min Final  .  GFR calc Af Amer 09/07/2016 >60  >60 mL/min Final   Comment: (NOTE) The eGFR has been calculated using the CKD EPI equation. This calculation has not been validated in all clinical situations. eGFR's persistently <60 mL/min signify possible Chronic Kidney Disease.   . Anion gap 09/07/2016 8  5 - 15 Final  . WBC 09/08/2016 13.8* 4.0 - 10.5 K/uL Final  . RBC 09/08/2016 3.63* 3.87 - 5.11 MIL/uL Final  . Hemoglobin 09/08/2016 10.3* 12.0 - 15.0 g/dL Final  . HCT 09/08/2016 33.6* 36.0 - 46.0 % Final  . MCV 09/08/2016 92.6  78.0 - 100.0 fL Final  . MCH 09/08/2016 28.4  26.0 - 34.0 pg Final  . MCHC 09/08/2016 30.7  30.0 - 36.0 g/dL Final  . RDW 09/08/2016 14.5  11.5 - 15.5 % Final  . Platelets 09/08/2016 276  150 - 400 K/uL Final  Hospital Outpatient Visit on 09/01/2016  Component Date Value Ref Range Status  . aPTT 09/01/2016 36  24 - 36 seconds Final  . WBC 09/01/2016 7.0  4.0 - 10.5 K/uL Final  . RBC 09/01/2016 4.30  3.87 - 5.11 MIL/uL Final  . Hemoglobin 09/01/2016 12.6  12.0 - 15.0 g/dL Final  . HCT 09/01/2016 40.2  36.0 - 46.0 % Final  . MCV 09/01/2016 93.5  78.0 - 100.0 fL Final  . MCH 09/01/2016 29.3  26.0 - 34.0 pg Final  . MCHC 09/01/2016 31.3  30.0 - 36.0 g/dL Final  . RDW 09/01/2016 14.7  11.5 - 15.5 % Final  . Platelets 09/01/2016 305  150 - 400 K/uL Final  . Sodium 09/01/2016 140  135 - 145 mmol/L Final  . Potassium 09/01/2016 3.6  3.5 - 5.1 mmol/L Final  . Chloride 09/01/2016 101  101 - 111 mmol/L Final  . CO2 09/01/2016 31  22 - 32 mmol/L Final  . Glucose, Bld 09/01/2016 99  65 - 99 mg/dL Final  . BUN 09/01/2016 15  6 - 20 mg/dL Final  . Creatinine, Ser 09/01/2016 0.84  0.44 - 1.00 mg/dL Final  . Calcium 09/01/2016 9.5  8.9 - 10.3 mg/dL Final  . Total Protein 09/01/2016 7.9  6.5 - 8.1 g/dL Final  . Albumin 09/01/2016 3.6  3.5 - 5.0 g/dL Final  . AST 09/01/2016 16  15 - 41 U/L Final  . ALT 09/01/2016 14  14 - 54 U/L Final  . Alkaline Phosphatase 09/01/2016 56  38 - 126  U/L Final  . Total Bilirubin 09/01/2016 0.6  0.3 - 1.2 mg/dL Final  . GFR calc non Af Amer 09/01/2016 >60  >60 mL/min  Final  . GFR calc Af Amer 09/01/2016 >60  >60 mL/min Final   Comment: (NOTE) The eGFR has been calculated using the CKD EPI equation. This calculation has not been validated in all clinical situations. eGFR's persistently <60 mL/min signify possible Chronic Kidney Disease.   . Anion gap 09/01/2016 8  5 - 15 Final  . Prothrombin Time 09/01/2016 18.6* 11.4 - 15.2 seconds Final  . INR 09/01/2016 1.53   Final  . ABO/RH(D) 09/01/2016 B POS   Final  . Antibody Screen 09/01/2016 NEG   Final  . Sample Expiration 09/01/2016 09/08/2016   Final  . Extend sample reason 09/01/2016 NO TRANSFUSIONS OR PREGNANCY IN THE PAST 3 MONTHS   Final  . MRSA, PCR 09/01/2016 NEGATIVE  NEGATIVE Final  . Staphylococcus aureus 09/01/2016 NEGATIVE  NEGATIVE Final   Comment:        The Xpert SA Assay (FDA approved for NASAL specimens in patients over 72 years of age), is one component of a comprehensive surveillance program.  Test performance has been validated by [facility] for patients greater than or equal to 74 year old. It is not intended to diagnose infection nor to guide or monitor treatment.   . ABO/RH(D) 09/01/2016 B POS   Final     X-Rays:No results found.  EKG: Orders placed or performed in visit on 11/26/14  . EKG 12-Lead     Hospital Course: Laura [name] is a 61 y.o. who was admitted to [facility]. They were brought to the operating room on 09/05/2016 and underwent Procedure(s): LEFT TOTAL KNEE ARTHROPLASTY.  Patient tolerated the procedure well and was later transferred to the recovery room and then to the orthopaedic floor for postoperative care.  They were given PO and IV analgesics for pain control following their surgery.  They were given 24 hours of postoperative antibiotics of  Anti-infectives    Start     Dose/Rate Route Frequency Ordered Stop   09/05/16  1600  ceFAZolin (ANCEF) IVPB 2g/100 mL premix     2 g 200 mL/hr over 30 Minutes Intravenous Every 6 hours 09/05/16 1204 09/05/16 2353   09/05/16 0600  ceFAZolin (ANCEF) 3 g in dextrose 5 % 50 mL IVPB     3 g 130 mL/hr over 30 Minutes Intravenous On call to O.R. 09/04/16 1138 09/05/16 0853     and started on DVT prophylaxis in the form of Xarelto.   PT and OT were ordered for total joint protocol.  Discharge planning consulted to help with postop disposition and equipment needs.  Patient had a good night on the evening of surgery.  They started to get up OOB with therapy on day one. Hemovac drain was pulled without difficulty.  Continued to work with therapy into day two despite some nausea.  Changed her pain medications due to the continued nausea.  Dressing was changed on day two and the incision was healing well.   Patient was seen in rounds on day three by Dr. Wynelle Link and was feeling better.  She worked with therapy and it was felt that she would be ready to go home later that day.  Discharge home with home health - Originally scheduled to start with outpatient therapy on Friday following discharge, however, due to social issues and logistics, she will need a week of HHPT and plan to start outpatient therapy the following Thursday or Friday (July 5th or 6th). Diet - Cardiac diet Follow up - in 2 weeks Activity - WBAT Disposition -  Home Condition Upon Discharge - Stable D/C Meds - See DC Summary DVT Prophylaxis - Xarelto   Discharge Instructions    Call MD / Call 911    Complete by:  As directed    If you experience chest pain or shortness of breath, CALL 911 and be transported to the hospital emergency room.  If you develope a fever above 101 F, pus (white drainage) or increased drainage or redness at the wound, or calf pain, call your surgeon's office.   Call MD / Call 911    Complete by:  As directed    If you experience chest pain or shortness of breath, CALL 911 and be transported to  the hospital emergency room.  If you develope a fever above 101 F, pus (white drainage) or increased drainage or redness at the wound, or calf pain, call your surgeon's office.   Change dressing    Complete by:  As directed    Change dressing daily with sterile 4 x 4 inch gauze dressing and apply TED hose. Do not submerge the incision under water.   Change dressing    Complete by:  As directed    Change dressing daily with sterile 4 x 4 inch gauze dressing and apply TED hose. Do not submerge the incision under water.   Constipation Prevention    Complete by:  As directed    Drink plenty of fluids.  Prune juice may be helpful.  You may use a stool softener, such as Colace (over the counter) 100 mg twice a day.  Use MiraLax (over the counter) for constipation as needed.   Constipation Prevention    Complete by:  As directed    Drink plenty of fluids.  Prune juice may be helpful.  You may use a stool softener, such as Colace (over the counter) 100 mg twice a day.  Use MiraLax (over the counter) for constipation as needed.   Diet - low sodium heart healthy    Complete by:  As directed    Diet - low sodium heart healthy    Complete by:  As directed    Discharge instructions    Complete by:  As directed    Resume the full dose Xarelto 20 mg daily at home starting Thursday following discharge.  Pick up stool softner and laxative for home use following surgery while on pain medications. Do not submerge incision under water. Please use good hand washing techniques while changing dressing each day. May shower starting three days after surgery. Please use a clean towel to pat the incision dry following showers. Continue to use ice for pain and swelling after surgery. Do not use any lotions or creams on the incision until instructed by your surgeon.  Wear both TED hose on both legs during the day every day for three weeks, but may remove the TED hose at night at home.  Postoperative Constipation  Protocol  Constipation - defined medically as fewer than three stools per week and severe constipation as less than one stool per week.  One of the most common issues patients have following surgery is constipation.  Even if you have a regular bowel pattern at home, your normal regimen is likely to be disrupted due to multiple reasons following surgery.  Combination of anesthesia, postoperative narcotics, change in appetite and fluid intake all can affect your bowels.  In order to avoid complications following surgery, here are some recommendations in order to help you during your recovery period.  Colace (docusate) - Pick up an over-the-counter form of Colace or another stool softener and take twice a day as long as you are requiring postoperative pain medications.  Take with a full glass of water daily.  If you experience loose stools or diarrhea, hold the colace until you stool forms back up.  If your symptoms do not get better within 1 week or if they get worse, check with your doctor.  Dulcolax (bisacodyl) - Pick up over-the-counter and take as directed by the product packaging as needed to assist with the movement of your bowels.  Take with a full glass of water.  Use this product as needed if not relieved by Colace only.   MiraLax (polyethylene glycol) - Pick up over-the-counter to have on hand.  MiraLax is a solution that will increase the amount of water in your bowels to assist with bowel movements.  Take as directed and can mix with a glass of water, juice, soda, coffee, or tea.  Take if you go more than two days without a movement. Do not use MiraLax more than once per day. Call your doctor if you are still constipated or irregular after using this medication for 7 days in a row.  If you continue to have problems with postoperative constipation, please contact the office for further assistance and recommendations.  If you experience "the worst abdominal pain ever" or develop nausea or  vomiting, please contact the office immediatly for further recommendations for treatment.   Discharge instructions    Complete by:  As directed    Resume the Xarelto 20 mg dosing at home following discharge from the hospital.  Pick up stool softner and laxative for home use following surgery while on pain medications. Do not submerge incision under water. Please use good hand washing techniques while changing dressing each day. May shower starting three days after surgery. Please use a clean towel to pat the incision dry following showers. Continue to use ice for pain and swelling after surgery. Do not use any lotions or creams on the incision until instructed by your surgeon.  Wear both TED hose on both legs during the day every day for three weeks, but may remove the TED hose at night at home.  Postoperative Constipation Protocol  Constipation - defined medically as fewer than three stools per week and severe constipation as less than one stool per week.  One of the most common issues patients have following surgery is constipation.  Even if you have a regular bowel pattern at home, your normal regimen is likely to be disrupted due to multiple reasons following surgery.  Combination of anesthesia, postoperative narcotics, change in appetite and fluid intake all can affect your bowels.  In order to avoid complications following surgery, here are some recommendations in order to help you during your recovery period.  Colace (docusate) - Pick up an over-the-counter form of Colace or another stool softener and take twice a day as long as you are requiring postoperative pain medications.  Take with a full glass of water daily.  If you experience loose stools or diarrhea, hold the colace until you stool forms back up.  If your symptoms do not get better within 1 week or if they get worse, check with your doctor.  Dulcolax (bisacodyl) - Pick up over-the-counter and take as directed by the product  packaging as needed to assist with the movement of your bowels.  Take with a full glass of water.  Use this product  as needed if not relieved by Colace only.   MiraLax (polyethylene glycol) - Pick up over-the-counter to have on hand.  MiraLax is a solution that will increase the amount of water in your bowels to assist with bowel movements.  Take as directed and can mix with a glass of water, juice, soda, coffee, or tea.  Take if you go more than two days without a movement. Do not use MiraLax more than once per day. Call your doctor if you are still constipated or irregular after using this medication for 7 days in a row.  If you continue to have problems with postoperative constipation, please contact the office for further assistance and recommendations.  If you experience "the worst abdominal pain ever" or develop nausea or vomiting, please contact the office immediatly for further recommendations for treatment.   Do not put a pillow under the knee. Place it under the heel.    Complete by:  As directed    Do not put a pillow under the knee. Place it under the heel.    Complete by:  As directed    Do not sit on low chairs, stoools or toilet seats, as it may be difficult to get up from low surfaces    Complete by:  As directed    Do not sit on low chairs, stoools or toilet seats, as it may be difficult to get up from low surfaces    Complete by:  As directed    Driving restrictions    Complete by:  As directed    No driving until released by the physician.   Driving restrictions    Complete by:  As directed    No driving until released by the physician.   Increase activity slowly as tolerated    Complete by:  As directed    Increase activity slowly as tolerated    Complete by:  As directed    Lifting restrictions    Complete by:  As directed    No lifting until released by the physician.   Lifting restrictions    Complete by:  As directed    No lifting until released by the physician.     Patient may shower    Complete by:  As directed    You may shower without a dressing once there is no drainage.  Do not wash over the wound.  If drainage remains, do not shower until drainage stops.   Patient may shower    Complete by:  As directed    You may shower without a dressing once there is no drainage.  Do not wash over the wound.  If drainage remains, do not shower until drainage stops.   TED hose    Complete by:  As directed    Use stockings (TED hose) for 3 weeks on both leg(s).  You may remove them at night for sleeping.   TED hose    Complete by:  As directed    Use stockings (TED hose) for 3 weeks on both leg(s).  You may remove them at night for sleeping.   Weight bearing as tolerated    Complete by:  As directed    Laterality:  left   Extremity:  Lower   Weight bearing as tolerated    Complete by:  As directed    Laterality:  left   Extremity:  Lower     Allergies as of 09/08/2016   No Known Allergies     Medication  List    TAKE these medications   acetaminophen 650 MG CR tablet Commonly known as:  TYLENOL Take 1,300 mg by mouth 2 (two) times daily as needed for pain.   carvedilol 6.25 MG tablet Commonly known as:  COREG Take 6.25 mg by mouth 2 (two) times daily.   Cholecalciferol 5000 units capsule Take 5,000 Units by mouth daily.   hydrochlorothiazide 25 MG tablet Commonly known as:  HYDRODIURIL Take 25 mg by mouth daily.   HYDROcodone-acetaminophen 5-325 MG tablet Commonly known as:  NORCO/VICODIN Take 1-2 tablets by mouth every 4 (four) hours as needed for moderate pain.   methocarbamol 500 MG tablet Commonly known as:  ROBAXIN Take 1 tablet (500 mg total) by mouth every 6 (six) hours as needed for muscle spasms.   nitroGLYCERIN 0.2 mg/hr patch Commonly known as:  MINITRAN Apply 1/4 patch to affected achilles, change daily   rivaroxaban 20 MG Tabs tablet Commonly known as:  XARELTO Take 1 tablet (20 mg total) by mouth daily with  supper.   traMADol 50 MG tablet Commonly known as:  ULTRAM Take 1-2 tablets (50-100 mg total) by mouth every 6 (six) hours as needed for moderate pain.   valACYclovir 500 MG tablet Commonly known as:  VALTREX Take 500 mg by mouth daily as needed (for fever blisters/cold sores.).            Durable Medical Equipment        Start     Ordered   09/06/16 1123  For home use only DME 3 n 1  Once     09/06/16 1123   09/06/16 1123  For home use only DME Walker rolling  Once    Question:  Patient needs a walker to treat with the following condition  Answer:  Osteoarthritis of knee   09/06/16 1123     Follow-up Information    Gaynelle Arabian, MD. Schedule an appointment as soon as possible for a visit on 09/20/2016.   Specialty:  Orthopedic Surgery Contact information: [address] [place] [address] [phone]           Signed: Arlee Muslim, PA-C Orthopaedic Surgery 09/08/2016, 8:59 AM

## 2016-09-07 LAB — CBC
HCT: 34.7 % — ABNORMAL LOW (ref 36.0–46.0)
Hemoglobin: 10.8 g/dL — ABNORMAL LOW (ref 12.0–15.0)
MCH: 29.1 pg (ref 26.0–34.0)
MCHC: 31.1 g/dL (ref 30.0–36.0)
MCV: 93.5 fL (ref 78.0–100.0)
Platelets: 276 10*3/uL (ref 150–400)
RBC: 3.71 MIL/uL — ABNORMAL LOW (ref 3.87–5.11)
RDW: 14.5 % (ref 11.5–15.5)
WBC: 14.7 10*3/uL — ABNORMAL HIGH (ref 4.0–10.5)

## 2016-09-07 LAB — BASIC METABOLIC PANEL
Anion gap: 8 (ref 5–15)
BUN: 13 mg/dL (ref 6–20)
CALCIUM: 8.6 mg/dL — AB (ref 8.9–10.3)
CO2: 31 mmol/L (ref 22–32)
Chloride: 95 mmol/L — ABNORMAL LOW (ref 101–111)
Creatinine, Ser: 0.85 mg/dL (ref 0.44–1.00)
GFR calc Af Amer: >60 mL/min (ref >60)
GLUCOSE: 129 mg/dL — AB (ref 65–99)
POTASSIUM: 3.6 mmol/L (ref 3.5–5.1)
Sodium: 134 mmol/L — ABNORMAL LOW (ref 135–145)

## 2016-09-07 MED ORDER — HYDROCODONE-ACETAMINOPHEN 5-325 MG PO TABS
1.0000 | ORAL_TABLET | ORAL | Status: DC | PRN
  Administered 2016-09-07 – 2016-09-08 (×3): 2 via ORAL
  Filled 2016-09-07 (×4): qty 2

## 2016-09-07 NOTE — Progress Notes (Signed)
09/07/16 1400  PT Visit Information  Last PT Received On 09/07/16  Assistance Needed +1  History of Present Illness Pt is a 61 year old female s/p L TKA  Subjective Data  Subjective Pt ambulated to bathroom and reports fatigue and weakness this afternoon.  Pt assisted back to bed and performed exercises.  Precautions  Precautions Fall;Knee  Required Braces or Orthoses Knee Immobilizer - Left  Restrictions  Other Position/Activity Restrictions WBAT  Pain Assessment  Pain Assessment 0-10  Pain Score 7  Pain Location R knee  Pain Descriptors / Indicators Sore;Aching  Pain Intervention(s) Limited activity within patient's tolerance;Monitored during session;Repositioned;Patient requesting pain meds-RN notified  Cognition  Arousal/Alertness Awake/alert  Behavior During Therapy WFL for tasks assessed/performed  Overall Cognitive Status Within Functional Limits for tasks assessed  Bed Mobility  Overal bed mobility Needs Assistance  Bed Mobility Sit to Supine  Sit to supine Mod assist  General bed mobility comments assist for bil LEs onto bed  Transfers  Overall transfer level Needs assistance  Equipment used Rolling walker (2 wheeled)  Transfers Sit to/from Stand  Sit to Stand From elevated surface;Min assist  Stand pivot transfers Min assist  General transfer comment assist for rise and steady  Ambulation/Gait  Ambulation/Gait assistance Min guard  Ambulation Distance (Feet) 15 Feet  Assistive device Rolling walker (2 wheeled)  Gait Pattern/deviations Step-to pattern;Decreased stance time - left;Antalgic  General Gait Details verbal cues for sequence, RW positioning, step length  Gait velocity decreased  Exercises  Exercises Total Joint  Total Joint Exercises  Ankle Circles/Pumps AROM;Both;10 reps  Quad Sets AROM;Left;10 reps  Heel Slides AAROM;Left;10 reps  Hip ABduction/ADduction AROM;Left;10 reps  Straight Leg Raises AAROM;Left;10 reps  Short Arc [place] AAROM;Left;10 reps   Goniometric ROM approx 40* AAROM knee flexion supine with heel slides  PT - End of Session  Equipment Utilized During Treatment Left knee immobilizer  Activity Tolerance Patient tolerated treatment well  Patient left in bed;with call bell/phone within reach;with bed alarm set  PT - Assessment/Plan  PT Plan Current plan remains appropriate  PT Visit Diagnosis Other abnormalities of gait and mobility (R26.89)  PT Frequency (ACUTE ONLY) 7X/week  Follow Up Recommendations Home health PT;Supervision/Assistance - 24 hour  PT equipment Rolling walker with 5" wheels;3in1 (PT)  AM-PAC PT "6 Clicks" Daily Activity Outcome Measure  Difficulty turning over in bed (including adjusting bedclothes, sheets and blankets)? 1  Difficulty moving from lying on back to sitting on the side of the bed?  1  Difficulty sitting down on and standing up from a chair with arms (e.g., wheelchair, bedside commode, etc,.)? 1  Help needed moving to and from a bed to chair (including a wheelchair)? 3  Help needed walking in hospital room? 3  Help needed climbing 3-5 steps with a railing?  3  6 Click Score 12  Mobility G Code  CL  PT Goal Progression  Progress towards PT goals Progressing toward goals  PT Time Calculation  PT Start Time (ACUTE ONLY) 1356  PT Stop Time (ACUTE ONLY) 1420  PT Time Calculation (min) (ACUTE ONLY) 24 min  PT General Charges  $$ ACUTE PT VISIT 1 Procedure  PT Treatments  $Gait Training 8-22 mins  $Therapeutic Exercise 8-22 mins   Zenovia JarredKati Samuel Rittenhouse, PT, DPT 09/07/2016 Pager: [phone]

## 2016-09-07 NOTE — Evaluation (Signed)
Occupational Therapy Evaluation Patient Details Name: Laura [name] MRN: [id number] DOB: 09-22-1955 Today's Date: 09/07/2016    History of Present Illness Pt is a 61 year old female s/p L TKA   Clinical Impression   Pt was admitted for the above sx.  Pt was nauseous this am, but able to participate in OT.  She will benefit from continued OT in acute setting to increase safety and independence with adls as well as to further educate her daughter who will be assisting her at home.  This was unplanned as her husband's father died recentlly.      Follow Up Recommendations  Supervision/Assistance - 24 hour    Equipment Recommendations  3 in 1 bedside commode (delivered)    Recommendations for Other Services       Precautions / Restrictions Precautions Precautions: Fall;Knee Required Braces or Orthoses: Knee Immobilizer - Left Restrictions Weight Bearing Restrictions: No Other Position/Activity Restrictions: WBAT      Mobility Bed Mobility         Supine to sit: Min assist     General bed mobility comments: support for RLE  Transfers   Equipment used: Rolling walker (2 wheeled)   Sit to Stand: From elevated surface;Min assist         General transfer comment: light min A to stand; steady    Balance                                           ADL either performed or assessed with clinical judgement   ADL Overall ADL's : Needs assistance/impaired     Grooming: Wash/dry hands;Supervision/safety;Standing   Upper Body Bathing: Set up;Sitting   Lower Body Bathing: Moderate assistance;Sit to/from stand   Upper Body Dressing : Set up;Sitting   Lower Body Dressing: Maximal assistance;Sit to/from stand   Toilet Transfer: Minimal assistance;Stand-pivot;BSC;RW   Toileting- Clothing Manipulation and Hygiene: Set up;Sit to/from stand         General ADL Comments: daughter will be helping at home.  used KI so pt could help support her leg when  KI was applied.   Pt fatiques easily. She is not ready for shower transfer at this time, but I will go over sequence.  She is fine with sponge bathing initially     Vision         Perception     Praxis      Pertinent Vitals/Pain Pain Assessment: 0-10 Pain Score: 3  Pain Location: R knee Pain Descriptors / Indicators: Sore;Aching Pain Intervention(s): Limited activity within patient's tolerance;Monitored during session;Premedicated before session;Repositioned (RN to get ice)     Hand Dominance     Extremity/Trunk Assessment Upper Extremity Assessment Upper Extremity Assessment: Overall WFL for tasks assessed           Communication Communication Communication: No difficulties   Cognition Arousal/Alertness: Awake/alert Behavior During Therapy: WFL for tasks assessed/performed Overall Cognitive Status: Within Functional Limits for tasks assessed                                     General Comments       Exercises     Shoulder Instructions      Home Living Family/patient expects to be discharged to:: Private residence Living Arrangements: Spouse/significant other Available Help at Discharge: Family  Bathroom Shower/Tub: Producer, television/film/videoWalk-in shower   Bathroom Toilet: Standard         Additional Comments: 3:1 was delivered      Prior Functioning/Environment Level of Independence: Independent                 OT Problem List: Decreased strength;Decreased activity tolerance;Decreased knowledge of use of DME or AE;Pain      OT Treatment/Interventions: Self-care/ADL training;DME and/or AE instruction;Balance training;Patient/family education;Therapeutic activities    OT Goals(Current goals can be found in the care plan section) Acute Rehab OT Goals Patient Stated Goal: return to independence OT Goal Formulation: With patient Time For Goal Achievement: 09/14/16 Potential to Achieve Goals: Good ADL Goals Pt Will Transfer to  Toilet: with supervision;bedside commode;ambulating Additional ADL Goal #1: pt will perform bed mobility at min guard level with sheet or leg lifter as needed in preparation for adls Additional ADL Goal #2: daughter will independently don KI and be able to safely assist with adls/toilet transfers  OT Frequency: Min 2X/week   Barriers to D/C:            Co-evaluation              AM-PAC PT "6 Clicks" Daily Activity     Outcome Measure Help from another person eating meals?: None Help from another person taking care of personal grooming?: A Little Help from another person toileting, which includes using toliet, bedpan, or urinal?: A Little Help from another person bathing (including washing, rinsing, drying)?: A Lot Help from another person to put on and taking off regular upper body clothing?: A Little Help from another person to put on and taking off regular lower body clothing?: A Lot 6 Click Score: 17   End of Session CPM Left Knee CPM Left Knee: Off  Activity Tolerance: Patient limited by fatigue Patient left: in chair;with call bell/phone within reach;with chair alarm set  OT Visit Diagnosis: Pain Pain - Right/Left: Right Pain - part of body: Knee                Time: 1610-96040901-0931 OT Time Calculation (min): 30 min Charges:  OT General Charges $OT Visit: 1 Procedure OT Evaluation $OT Eval Low Complexity: 1 Procedure OT Treatments $Self Care/Home Management : 8-22 mins G-Codes:     Laura [name], OTR/L [phone] 09/07/2016  Laura [name] 09/07/2016, 10:20 AM

## 2016-09-07 NOTE — Progress Notes (Signed)
Physical Therapy Treatment Patient Details Name: Laura NorthLori Novitski MRN: [id number] DOB: 1956/01/31 Today's Date: 09/07/2016    History of Present Illness Pt is a 61 year old female s/p L TKA    PT Comments    Pt ambulated in hallway however reporting more nausea this morning (RN aware).   Follow Up Recommendations  Home health PT;Supervision/Assistance - 24 hour     Equipment Recommendations  Rolling walker with 5" wheels;3in1 (PT)    Recommendations for Other Services       Precautions / Restrictions Precautions Precautions: Fall;Knee Required Braces or Orthoses: Knee Immobilizer - Left Restrictions Weight Bearing Restrictions: No Other Position/Activity Restrictions: WBAT    Mobility  Bed Mobility         Supine to sit: Min assist     General bed mobility comments: pt up in recliner  Transfers Overall transfer level: Needs assistance Equipment used: Rolling walker (2 wheeled) Transfers: Sit to/from Stand Sit to Stand: From elevated surface;Min assist         General transfer comment: assist for rise and steady  Ambulation/Gait Ambulation/Gait assistance: Min guard Ambulation Distance (Feet): 100 Feet Assistive device: Rolling walker (2 wheeled) Gait Pattern/deviations: Step-to pattern;Decreased stance time - left;Antalgic Gait velocity: decreased   General Gait Details: verbal cues for sequence, RW positioning, step length   Stairs            Wheelchair Mobility    Modified Rankin (Stroke Patients Only)       Balance                                            Cognition Arousal/Alertness: Awake/alert Behavior During Therapy: WFL for tasks assessed/performed Overall Cognitive Status: Within Functional Limits for tasks assessed                                        Exercises      General Comments        Pertinent Vitals/Pain Pain Assessment: 0-10 Pain Score: 4  Pain Location: R knee Pain  Descriptors / Indicators: Sore;Aching Pain Intervention(s): Limited activity within patient's tolerance;Monitored during session;Repositioned;Ice applied    Home Living Family/patient expects to be discharged to:: Private residence Living Arrangements: Spouse/significant other Available Help at Discharge: Family           Additional Comments: 3:1 was delivered    Prior Function Level of Independence: Independent          PT Goals (current goals can now be found in the care plan section) Acute Rehab PT Goals Patient Stated Goal: return to independence Progress towards PT goals: Progressing toward goals    Frequency    7X/week      PT Plan Current plan remains appropriate    Co-evaluation              AM-PAC PT "6 Clicks" Daily Activity  Outcome Measure  Difficulty turning over in bed (including adjusting bedclothes, sheets and blankets)?: Total Difficulty moving from lying on back to sitting on the side of the bed? : Total Difficulty sitting down on and standing up from a chair with arms (e.g., wheelchair, bedside commode, etc,.)?: Total Help needed moving to and from a bed to chair (including a wheelchair)?: A Little Help needed walking in hospital room?:  A Little Help needed climbing 3-5 steps with a railing? : A Little 6 Click Score: 12    End of Session Equipment Utilized During Treatment: Gait belt;Left knee immobilizer Activity Tolerance: Patient tolerated treatment well Patient left: with call bell/phone within reach;in chair   PT Visit Diagnosis: Other abnormalities of gait and mobility (R26.89)     Time: 1610-9604 PT Time Calculation (min) (ACUTE ONLY): 20 min  Charges:  $Gait Training: 8-22 mins                    G Codes:      Zenovia Jarred, PT, DPT 09/07/2016 Pager: [phone]   Maida Sale E 09/07/2016, 1:01 PM

## 2016-09-08 LAB — CBC
HCT: 33.6 % — ABNORMAL LOW (ref 36.0–46.0)
Hemoglobin: 10.3 g/dL — ABNORMAL LOW (ref 12.0–15.0)
MCH: 28.4 pg (ref 26.0–34.0)
MCHC: 30.7 g/dL (ref 30.0–36.0)
MCV: 92.6 fL (ref 78.0–100.0)
PLATELETS: 276 10*3/uL (ref 150–400)
RBC: 3.63 MIL/uL — ABNORMAL LOW (ref 3.87–5.11)
RDW: 14.5 % (ref 11.5–15.5)
WBC: 13.8 10*3/uL — ABNORMAL HIGH (ref 4.0–10.5)

## 2016-09-08 MED ORDER — RIVAROXABAN 10 MG PO TABS
20.0000 mg | ORAL_TABLET | Freq: Every day | ORAL | Status: DC
  Filled 2016-09-08: qty 2

## 2016-09-08 MED ORDER — TRAMADOL HCL 50 MG PO TABS: 50.0000 mg | ORAL_TABLET | Freq: Four times a day (QID) | ORAL | 0 refills | Status: DC | PRN

## 2016-09-08 MED ORDER — HYDROCODONE-ACETAMINOPHEN 5-325 MG PO TABS: 1.0000 | ORAL_TABLET | ORAL | 0 refills | Status: DC | PRN

## 2016-09-08 MED ORDER — METHOCARBAMOL 500 MG PO TABS: 500.0000 mg | ORAL_TABLET | Freq: Four times a day (QID) | ORAL | 0 refills | Status: DC | PRN

## 2016-09-08 NOTE — Progress Notes (Addendum)
    LATE ENTRY NOTE Date of Service of Visit - 09/07/2016   Subjective:  Days Post-Op Procedure(s) (LRB): LEFT TOTAL KNEE ARTHROPLASTY (Left) Patient reports pain as mild and moderate.   Patient seen in rounds for Dr. Lequita HaltAluisio. Having some nausea.  Will change pain medications around. Patient is having problems with pain in the knee, requiring pain medications Plan is to go Home after hospital stay.  Objective: Vital signs in last 24 hours: Temp:  [97.6 F (36.4 C)-99.6 F (37.6 C)] 97.6 F (36.4 C) (06/28 0507) Pulse Rate:  [87-101] 98 (06/28 0507) Resp:  [17-18] 17 (06/28 0507) BP: (112-121)/(52-73) 112/60 (06/28 0507) SpO2:  [93 %-94 %] 94 % (06/28 0507)  Intake/Output from previous day:  Intake/Output Summary (Last 24 hours) at 09/08/16 0748 Last data filed at 09/08/16 0511  Gross per 24 hour  Intake              960 ml  Output                0 ml  Net              960 ml    Intake/Output this shift: No intake/output data recorded.  Labs:  Recent Labs  09/06/16 0408 09/07/16 0437   HGB 10.5* 10.8*      Recent Labs  09/06/16 0408   NA 137   K 3.6   CL 101   CO2 30   BUN 17   CREATININE 0.83   GLUCOSE 139*   CALCIUM 8.3*    No results for input(s): LABPT, INR in the last 72 hours.  EXAM General - Patient is Alert, Appropriate and Oriented Extremity - Neurovascular intact Sensation intact distally Intact pulses distally Dorsiflexion/Plantar flexion intact Dressing/Incision - clean, dry Motor Function - intact, moving foot and toes well on exam.   Past Medical History:  Diagnosis Date  . Edema   . 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 Herpes   . High blood pressure   . History of blood clots   . Obesity   . Partial tear of Achilles tendon   . Pneumonia   . Pulmonary embolism (HCC)   . 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 Vitamin D deficiency     Assessment/Plan: 2 Days  Post-Op Procedure(s) (LRB): LEFT TOTAL KNEE ARTHROPLASTY (Left) Principal Problem:   Osteoarthritis of knees, bilateral Active Problems:   OA (osteoarthritis) of knee  Estimated body mass index is 45.19 kg/m as calculated from the following:   Height as of this encounter: 5' 6.5" (1.689 m).   Weight as of this encounter: 128.9 kg (284 lb 4 oz). Up with therapy Plan for discharge tomorrow  DVT Prophylaxis - Xarelto Weight-Bearing as tolerated to left leg  Avel Peacerew Antoria Lanza, PA-C Orthopaedic Surgery 09/08/2016, 7:48 AM

## 2016-09-08 NOTE — Progress Notes (Signed)
Occupational Therapy Treatment Patient Details Name: Amyrie Illingworth MRN: [id number] DOB: 11-Mar-1956 Today's Date: 09/08/2016    History of present illness Pt is a 61 year old female s/p L TKA   OT comments  All education completed this session. Daughter applied KI with min A and verbalizes comfort with assisting pt  Follow Up Recommendations  Supervision/Assistance - 24 hour    Equipment Recommendations  3 in 1 bedside commode    Recommendations for Other Services      Precautions / Restrictions Precautions Precautions: Fall;Knee Required Braces or Orthoses: Knee Immobilizer - Left Restrictions Weight Bearing Restrictions: No Other Position/Activity Restrictions: WBAT       Mobility Bed Mobility               General bed mobility comments: OOB  Transfers   Equipment used: Rolling walker (2 wheeled)   Sit to Stand: Min guard         General transfer comment: for safety.  Assist to support leg when scooting back in chair    Balance                                           ADL either performed or assessed with clinical judgement   ADL                           Toilet Transfer: Min guard;Ambulation;BSC;RW             General ADL Comments: daughter present and assisted pt with donning KI.  Pt used leg lifter to help.  Gave very minimal assitance to daughter to adjust placement of KI.  Reviewed shower transfer with pt and gave her a handout. She will not do this until her husband is back home.  HHPT can practice with her, if needed     Vision       Perception     Praxis      Cognition Arousal/Alertness: Awake/alert Behavior During Therapy: WFL for tasks assessed/performed Overall Cognitive Status: Within Functional Limits for tasks assessed                                          Exercises     Shoulder Instructions       General Comments      Pertinent Vitals/ Pain       Pain Score: 4   Pain Location: R knee Pain Descriptors / Indicators: Sore;Aching Pain Intervention(s): Limited activity within patient's tolerance;Monitored during session;Premedicated before session;Repositioned  Home Living                                          Prior Functioning/Environment              Frequency           Progress Toward Goals  OT Goals(current goals can now be found in the care plan section)  Progress towards OT goals: Progressing toward goals (no further OT is needed at this time)  Acute Rehab OT Goals Patient Stated Goal: return to independence  Plan      Co-evaluation  AM-PAC PT "6 Clicks" Daily Activity     Outcome Measure   Help from another person eating meals?: None Help from another person taking care of personal grooming?: A Little Help from another person toileting, which includes using toliet, bedpan, or urinal?: A Little Help from another person bathing (including washing, rinsing, drying)?: A Lot Help from another person to put on and taking off regular upper body clothing?: A Little Help from another person to put on and taking off regular lower body clothing?: A Lot 6 Click Score: 17    End of Session    OT Visit Diagnosis: Pain Pain - Right/Left: Right Pain - part of body: Knee   Activity Tolerance Patient tolerated treatment well   Patient Left in chair;with call bell/phone within reach;with family/visitor present   Nurse Communication          Time: [id number] OT Time Calculation (min): 10 min  Charges: OT General Charges $OT Visit: 1 Procedure OT Treatments $Self Care/Home Management : 8-22 mins  Marica OtterMaryellen Jamaal Bernasconi, OTR/L [phone] 09/08/2016   Kajal Scalici 09/08/2016, 11:20 AM

## 2016-09-08 NOTE — Progress Notes (Signed)
   Subjective: 3 Days Post-Op Procedure(s) (LRB): LEFT TOTAL KNEE ARTHROPLASTY (Left) Patient reports pain as mild.   Patient seen in rounds with Dr. Lequita HaltAluisio. Doing better today. Patient is well, but has had some minor complaints of pain in the knee, requiring pain medications Patient is ready to go home today.  Objective: Vital signs in last 24 hours: Temp:  [97.6 F (36.4 C)-99.6 F (37.6 C)] 97.6 F (36.4 C) (06/28 0507) Pulse Rate:  [87-101] 98 (06/28 0507) Resp:  [17-18] 17 (06/28 0507) BP: (112-121)/(52-73) 112/60 (06/28 0507) SpO2:  [93 %-94 %] 94 % (06/28 0507)  Intake/Output from previous day:  Intake/Output Summary (Last 24 hours) at 09/08/16 0750 Last data filed at 09/08/16 0511  Gross per 24 hour  Intake              960 ml  Output                0 ml  Net              960 ml    Intake/Output this shift: No intake/output data recorded.  Labs:  Recent Labs  09/06/16 0408 09/07/16 0437 09/08/16 0409  HGB 10.5* 10.8* 10.3*    Recent Labs  09/07/16 0437 09/08/16 0409  WBC 14.7* 13.8*  RBC 3.71* 3.63*  HCT 34.7* 33.6*  PLT 276 276    Recent Labs  09/06/16 0408 09/07/16 0437  NA 137 134*  K 3.6 3.6  CL 101 95*  CO2 30 31  BUN 17 13  CREATININE 0.83 0.85  GLUCOSE 139* 129*  CALCIUM 8.3* 8.6*   No results for input(s): LABPT, INR in the last 72 hours.  EXAM: General - Patient is Alert, Appropriate and Oriented Extremity - Neurovascular intact Sensation intact distally Intact pulses distally Dorsiflexion/Plantar flexion intact Incision - clean, dry, no drainage Motor Function - intact, moving foot and toes well on exam.   Assessment/Plan: 3 Days Post-Op Procedure(s) (LRB): LEFT TOTAL KNEE ARTHROPLASTY (Left) Procedure(s) (LRB): LEFT TOTAL KNEE ARTHROPLASTY (Left) Past Medical History:  Diagnosis Date  . Edema   . 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 Herpes   . High blood pressure   . History of blood clots   .  Obesity   . Partial tear of Achilles tendon   . Pneumonia   . Pulmonary embolism (HCC)   . 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 Vitamin D deficiency    Principal Problem:   Osteoarthritis of knees, bilateral Active Problems:   OA (osteoarthritis) of knee  Estimated body mass index is 45.19 kg/m as calculated from the following:   Height as of this encounter: 5' 6.5" (1.689 m).   Weight as of this encounter: 128.9 kg (284 lb 4 oz). Advance diet Up with therapy Discharge home with home health Diet - Cardiac diet Follow up - in 2 weeks Activity - WBAT Disposition - Home Condition Upon Discharge - Stable D/C Meds - See DC Summary DVT Prophylaxis - Xarelto  Avel Peacerew Gissel Keilman, PA-C Orthopaedic Surgery 09/08/2016, 7:50 AM

## 2016-09-08 NOTE — Progress Notes (Signed)
Physical Therapy Treatment Patient Details Name: Laura [name] MRN: [id number] DOB: September 13, 1955 Today's Date: 09/08/2016    History of Present Illness Pt is a 61 year old female s/p L TKA    PT Comments    Pt ambulated in hallway and practiced safe stair technique.  Pt also performed LE exercises.  Pt mobilizing better today, and pt feels ready for d/c home later.  Pt provided with handouts for exercises and stairs.     Follow Up Recommendations  Home health PT;Supervision/Assistance - 24 hour     Equipment Recommendations  Rolling walker with 5" wheels;3in1 (PT)    Recommendations for Other Services       Precautions / Restrictions Precautions Precautions: Fall;Knee Required Braces or Orthoses: Knee Immobilizer - Left Restrictions Weight Bearing Restrictions: No Other Position/Activity Restrictions: WBAT    Mobility  Bed Mobility Overal bed mobility: Needs Assistance Bed Mobility: Supine to Sit     Supine to sit: Min guard     General bed mobility comments: min/guard for safety, pt able to self assist L LE  Transfers Overall transfer level: Needs assistance Equipment used: Rolling walker (2 wheeled) Transfers: Sit to/from Stand Sit to Stand: From elevated surface;Min guard         General transfer comment: verbal cues for technique, min/guard for safety  Ambulation/Gait Ambulation/Gait assistance: Min guard Ambulation Distance (Feet): 60 Feet Assistive device: Rolling walker (2 wheeled) Gait Pattern/deviations: Step-to pattern;Decreased stance time - left;Antalgic Gait velocity: decreased   General Gait Details: verbal cues for RW positioning, step length   Stairs Stairs: Yes   Stair Management: Step to pattern;Backwards;With walker Number of Stairs: 3 General stair comments: verbal cues for technique, safety, sequence, provided handout  Wheelchair Mobility    Modified Rankin (Stroke Patients Only)       Balance                                             Cognition Arousal/Alertness: Awake/alert Behavior During Therapy: WFL for tasks assessed/performed Overall Cognitive Status: Within Functional Limits for tasks assessed                                        Exercises Total Joint Exercises Ankle Circles/Pumps: AROM;Both;10 reps Quad Sets: AROM;Left;10 reps Short Arc QuadBarbaraann Boys;Left;10 reps Heel Slides: AAROM;Left;10 reps Hip ABduction/ADduction: AROM;Left;10 reps Straight Leg Raises: AAROM;Left;10 reps    General Comments        Pertinent Vitals/Pain Pain Assessment: 0-10 Pain Score: 5  Pain Location: R knee Pain Descriptors / Indicators: Sore;Aching Pain Intervention(s): Limited activity within patient's tolerance;Repositioned;Monitored during session;Ice applied    Home Living                      Prior Function            PT Goals (current goals can now be found in the care plan section) Acute Rehab PT Goals Patient Stated Goal: return to independence Progress towards PT goals: Progressing toward goals    Frequency    7X/week      PT Plan Current plan remains appropriate    Co-evaluation              AM-PAC PT "6 Clicks" Daily Activity  Outcome Measure  Difficulty  turning over in bed (including adjusting bedclothes, sheets and blankets)?: A Little Difficulty moving from lying on back to sitting on the side of the bed? : A Little Difficulty sitting down on and standing up from a chair with arms (e.g., wheelchair, bedside commode, etc,.)?: A Little Help needed moving to and from a bed to chair (including a wheelchair)?: A Little Help needed walking in hospital room?: A Little Help needed climbing 3-5 steps with a railing? : A Little 6 Click Score: 18    End of Session Equipment Utilized During Treatment: Left knee immobilizer;Gait belt Activity Tolerance: Patient tolerated treatment well Patient left: in chair;with call bell/phone  within reach   PT Visit Diagnosis: Other abnormalities of gait and mobility (R26.89)     Time: 6295-28410920-0945 PT Time Calculation (min) (ACUTE ONLY): 25 min  Charges:  $Gait Training: 8-22 mins $Therapeutic Exercise: 8-22 mins                    G Codes:       Zenovia JarredKati Clea Dubach, PT, DPT 09/08/2016 Pager: [phone]  Maida SaleLEMYRE,KATHrine E 09/08/2016, 1:09 PM

## 2016-09-08 NOTE — Care Management Note (Signed)
Case Management Note  Patient Details  Name: Laura [name] MRN: [id number] Date of Birth: [birth year]  Subjective/Objective:         S/p L TKA           Action/Plan: Discharge Planning: Pt had death in her family and husband will be out of town. She will not have support for Outpt PT. [facility]. Contacted Liaison with new referral. Has RW and 3n1 bedside commode.   PCP WHITE, CYNTHIA MD \ Expected Discharge Date:  09/08/16               Expected Discharge Plan:  Home w Home Health Services  In-House Referral:  NA  Discharge planning Services  CM Consult  Post Acute Care Choice:  NA Choice offered to:  NA  DME Arranged:  3-N-1, Walker rolling DME Agency:  [facility]  HH Arranged:  PT HH Agency[facility]  [facility]  Status of Service:  Completed, signed off  If discussed at [employer] Meetings, dates discussed:    Additional Comments:  [name], [name], RN 09/08/2016, 11:56 AM

## 2016-09-08 NOTE — Progress Notes (Signed)
Physical Therapy Treatment Patient Details Name: Laura NorthLori [name] MRN: [id number] DOB: May 18, 1955 Today's Date: 09/08/2016    History of Present Illness Pt is a 61 year old female s/p L TKA    PT Comments    Requested to return to room to practice steps with daughter and spouse present.  Pt ambulated in to hallway, and pt performed steps while family educated on safe technique.  Also reviewed safe car transfer and answered questions about ice.  Pt feels ready for d/c home.   Follow Up Recommendations  Home health PT;Supervision/Assistance - 24 hour     Equipment Recommendations  Rolling walker with 5" wheels;3in1 (PT)    Recommendations for Other Services       Precautions / Restrictions Precautions Precautions: Fall;Knee Required Braces or Orthoses: Knee Immobilizer - Left Restrictions Weight Bearing Restrictions: No Other Position/Activity Restrictions: WBAT    Mobility  Bed Mobility Overal bed mobility: Needs Assistance Bed Mobility: Supine to Sit     Supine to sit: Min guard     General bed mobility comments: min/guard for safety, pt able to self assist L LE  Transfers Overall transfer level: Needs assistance Equipment used: Rolling walker (2 wheeled) Transfers: Sit to/from Stand Sit to Stand: From elevated surface;Min guard         General transfer comment: min/guard for safety  Ambulation/Gait Ambulation/Gait assistance: Min guard Ambulation Distance (Feet): 20 Feet Assistive device: Rolling walker (2 wheeled) Gait Pattern/deviations: Step-to pattern;Decreased stance time - left;Antalgic Gait velocity: decreased   General Gait Details: performing well with RW, ambulated short distance to steps   Stairs Stairs: Yes   Stair Management: Step to pattern;Backwards;With walker Number of Stairs: 3 General stair comments: verbal cues for technique, safety, sequence, spouse and daughter present and assisted with RW  Wheelchair Mobility    Modified  Rankin (Stroke Patients Only)       Balance                                            Cognition Arousal/Alertness: Awake/alert Behavior During Therapy: WFL for tasks assessed/performed Overall Cognitive Status: Within Functional Limits for tasks assessed                                        Exercises Total Joint Exercises Ankle Circles/Pumps: AROM;Both;10 reps Quad Sets: AROM;Left;10 reps Short Arc QuadBarbaraann [name]: AAROM;Left;10 reps Heel Slides: AAROM;Left;10 reps Hip ABduction/ADduction: AROM;Left;10 reps Straight Leg Raises: AAROM;Left;10 reps    General Comments        Pertinent Vitals/Pain Pain Assessment: 0-10 Pain Score: 5  Pain Location: R knee Pain Descriptors / Indicators: Sore;Aching Pain Intervention(s): Limited activity within patient's tolerance;Repositioned;Monitored during session;Ice applied    Home Living                      Prior Function            PT Goals (current goals can now be found in the care plan section) Acute Rehab PT Goals Patient Stated Goal: return to independence Progress towards PT goals: Progressing toward goals    Frequency    7X/week      PT Plan Current plan remains appropriate    Co-evaluation  AM-PAC PT "6 Clicks" Daily Activity  Outcome Measure  Difficulty turning over in bed (including adjusting bedclothes, sheets and blankets)?: A Little Difficulty moving from lying on back to sitting on the side of the bed? : A Little Difficulty sitting down on and standing up from a chair with arms ([name].g., wheelchair, bedside commode, etc,.)?: A Little Help needed moving to and from a bed to chair (including a wheelchair)?: A Little Help needed walking in hospital room?: A Little Help needed climbing 3-5 steps with a railing? : A Little 6 Click Score: 18    End of Session Equipment Utilized During Treatment: Left knee immobilizer;Gait belt Activity Tolerance:  Patient tolerated treatment well Patient left: in chair;with call bell/phone within reach   PT Visit Diagnosis: Other abnormalities of gait and mobility (R26.89)     Time: 1610-9604 PT Time Calculation (min) (ACUTE ONLY): 8 min  Charges:  $Gait Training: 8-22 mins                    G Codes:       Laura [name], PT, DPT 09/08/2016 Pager: [phone]  Laura [name] 09/08/2016, 1:16 PM

## 2016-09-26 ENCOUNTER — Other Ambulatory Visit: Payer: Self-pay | Admitting: Family Medicine

## 2016-09-26 DIAGNOSIS — N6310 Unspecified lump in the right breast, unspecified quadrant: Secondary | ICD-10-CM

## 2016-10-11 ENCOUNTER — Ambulatory Visit
Admission: RE | Admit: 2016-10-11 | Discharge: 2016-10-11 | Disposition: A | Discharge status: Home / Self Care | Payer: BC Managed Care – PPO | Source: Ambulatory Visit | Attending: Family Medicine | Admitting: Family Medicine

## 2016-10-11 DIAGNOSIS — N6310 Unspecified lump in the right breast, unspecified quadrant: Secondary | ICD-10-CM

## 2017-05-03 ENCOUNTER — Other Ambulatory Visit: Payer: Self-pay | Admitting: Oncology

## 2017-05-03 DIAGNOSIS — I2699 Other pulmonary embolism without acute cor pulmonale: Secondary | ICD-10-CM

## 2017-05-03 DIAGNOSIS — D689 Coagulation defect, unspecified: Secondary | ICD-10-CM

## 2017-05-03 NOTE — Telephone Encounter (Signed)
rivaroxaban (XARELTO) 20 MG TABS tablet, refill request @ [facility] in [place].

## 2017-06-12 ENCOUNTER — Encounter: Payer: BC Managed Care – PPO | Admitting: Oncology

## 2017-06-14 ENCOUNTER — Encounter: Payer: Self-pay | Admitting: Family Medicine

## 2017-06-14 ENCOUNTER — Ambulatory Visit: Payer: BC Managed Care – PPO | Admitting: Family Medicine

## 2017-06-14 DIAGNOSIS — G8929 Other chronic pain: Secondary | ICD-10-CM | POA: Diagnosis not present

## 2017-06-14 DIAGNOSIS — M25561 Pain in right knee: Secondary | ICD-10-CM

## 2017-06-14 MED ORDER — METHYLPREDNISOLONE ACETATE 40 MG/ML IJ SUSP
40.0000 mg | Freq: Once | INTRAMUSCULAR | Status: AC
  Administered 2017-06-14: 40 mg via INTRA_ARTICULAR

## 2017-06-15 ENCOUNTER — Encounter: Payer: Self-pay | Admitting: Family Medicine

## 2017-06-15 NOTE — Assessment & Plan Note (Signed)
2/2 known DJD.  Repeated cortisone injection today.  Tylenol, topical medications, aleve if needed.  F/u prn.  After informed written consent timeout was performed, patient was seated on exam table. Right knee was prepped with alcohol swab and utilizing anterolateral approach, patient's right knee was injected intraarticularly with 3:1 marcaine: depomedrol. Patient tolerated the procedure well without immediate complications.

## 2017-06-15 NOTE — Progress Notes (Signed)
Patient ID: Laura [name], female   DOB: [birth year], 62 y.o.   MRN: [id number]  Subjective:   PCP: Dr. Dema Severin  Knee Pain    62 yo F here for right knee pain  02/29/12: Patient has known history of mod-severe DJD of bilateral knees. Last cortisone injections 3 months ago - started wearing off recently and going on a trip to [place] Has h/o PEs, on anticoagulation with coumadin - last INR in past week was 2.0. Pain currently worse in right knee than left - more lateral than medial but worse medially on left knee.  09/10/13: Patient returns today for bilateral knee injections. Tried synvisc twice since last visit here. First set helped for 3 months - second one did not help. At least a few months relief with cortisone shots in past. No new injuries.  01/15/14: Patient reports her knees started bothering her a couple weeks ago. Did well with cortisone shots. Taking tylenol.  04/17/14: Patient returns for bilateral knee injections for DJD. Did well since last visit and just recently they started bothering her again. Left knee 6/10 pain, right 4/10. No catching, locking, giving out.  4/18: Patient returns for repeat injections - both knees 3/10 level. She's also interested in viscosupplementation - working on getting approval Jacklyn Shell, our usual, is not approved by her insurance).  10/17: Patient returns for bilateral knee injections. Not much benefit last visit from cortisone injections - she did supartz by orthopedics and also not a whole lot of benefit. Constant dull ache in both knees 4/10 right, 6/10 left. Pain anterior, more medial. No catching, locking, giving out. Still bothered by her achilles on right as well - no new injuries. Doing some of the exercises. Has heel lifts. No skin changes, fever, other complaints.  04/08/15: Patient returns as pain started to recur past couple weeks in both knees. Pain level 0/10 at rest, up to 4/10 on left, 2/10 on right  especially with walking. Pain is sharp, anteromedial. Worse when getting up from seated position also. No skin changes, fever, other complaints.  4/26: Patient returns for bilateral knee injections. Pain level 5/10 bilaterally now. Worse with walking. No skin changes, numbness.  7/18: Patient returns with 3/10 pain in both knees. Worse first thing in morning, when getting up from prolonged sitting. Pain is dull. No skin changes, numbness. She would like bilateral knee injections.  12/25/15: Patient reports her knee pain has worsened. Pain right knee 4/10, left 6/10 anterior, dull. Worse with walking. No pain at rest. Would like to try viscosupplementation if cortisone injections don't help as much this time. No skin changes, numbness.  07/15/16: Patient returns with pain in both knees - up to 6/10 on left, 3/10 on right and sharp, anterior. Pain is 0/10 at rest bilaterally. Worse with ambulation. No skin changes, numbness.  06/14/17: Patient is doing very well from her knee replacement of left knee. Right knee has started worsening over last few weeks. Pain level 5/10, worse with walking. No skin changes, numbness.  Past Medical History:  Diagnosis Date  . Edema   . 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 Herpes   . High blood pressure   . History of blood clots   . Obesity   . Partial tear of Achilles tendon   . Pneumonia   . Pulmonary embolism ([place])   . 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 Vitamin D deficiency  Current Outpatient Medications on File Prior to Visit  Medication Sig Dispense Refill  . acetaminophen (TYLENOL) 650 MG CR tablet Take 1,300 mg by mouth 2 (two) times daily as needed for pain.    . carvedilol (COREG) 6.25 MG tablet Take 6.25 mg by mouth 2 (two) times daily.     . Cholecalciferol 5000 UNITS capsule Take 5,000 Units by mouth daily.    . hydrochlorothiazide (HYDRODIURIL) 25 MG  tablet Take 25 mg by mouth daily.    Marland Kitchen HYDROcodone-acetaminophen (NORCO/VICODIN) 5-325 MG tablet Take 1-2 tablets by mouth every 4 (four) hours as needed for moderate pain. 80 tablet 0  . methocarbamol (ROBAXIN) 500 MG tablet Take 1 tablet (500 mg total) by mouth every 6 (six) hours as needed for muscle spasms. 80 tablet 0  . nitroGLYCERIN (MINITRAN) 0.2 mg/hr patch Apply 1/4 patch to affected achilles, change daily (Patient not taking: Reported on 08/29/2016) 30 patch 1  . traMADol (ULTRAM) 50 MG tablet Take 1-2 tablets (50-100 mg total) by mouth every 6 (six) hours as needed for moderate pain. 56 tablet 0  . valACYclovir (VALTREX) 500 MG tablet Take 500 mg by mouth daily as needed (for fever blisters/cold sores.).     Marland Kitchen XARELTO 20 MG TABS tablet TAKE 1 TABLET BY MOUTH ONCE DAILY WITH SUPPER 30 tablet 11   No current facility-administered medications on file prior to visit.     Past Surgical History:  Procedure Laterality Date  . BREAST BIOPSY    . [place]  . 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 Marital status: Married    Spouse name: Not on file  . Number of children: 2  . Years of education: Not on file  . Highest education level: Not on file  Occupational History    Employer: [employer]  . Financial resource strain: Not on file  . Food insecurity:    Worry: Not on file    Inability: Not on file  . Transportation needs:    Medical: Not on file    Non-medical: Not on file  Tobacco Use  . Smoking status: Never Smoker  . Smokeless tobacco: Never Used  Substance and Sexual Activity  . Alcohol use: Yes    Alcohol/week: 0.0 oz    Comment: Rarely.  . Drug use: No  . Sexual activity: Not on file  Lifestyle  . Physical activity:    Days per  week: Not on file    Minutes per session: Not on file  . Stress: Not on file  Relationships  . Social connections:    Talks on phone: Not on file    Gets together: Not on file    Attends religious service: Not on file    Active member of club or organization: Not on file    Attends meetings of clubs or organizations: Not on file    Relationship status: Not on file  . Intimate partner violence:    Fear of current or ex partner: Not on file    Emotionally abused: Not on file    Physically abused: Not on file    Forced sexual activity: Not on file  Other Topics Concern  . Not on file  Social History Narrative  . Not  on file    Family History  Problem Relation Age of Onset  . Hypertension Mother   . Heart failure Mother   . Prostate cancer Father   . Multiple myeloma Father   . Heart failure Father   . Heart attack Neg Hx   . Hyperlipidemia Neg Hx   . Diabetes Neg Hx   . Sudden death Neg Hx     BP (!) 147/91   Ht _0  (1.702 m)   Wt 250 lb (113.4 kg)   BMI 39.16 kg/m   Review of Systems See HPI above.    Objective:   Physical Exam  Gen: NAD, comfortable in exam room.  Right knee: No gross deformity, ecchymoses, swelling. TTP lateral > medial joint lines.  No other tenderness. FROM with 5/5 strength flexion/extension. Negative ant/post drawers. Negative valgus/varus testing. Negative lachmanns. Negative mcmurrays, apleys, patellar apprehension. NV intact distally.  Left knee: Well healed TKR scar.    Assessment & Plan:  1. Right knee pain - 2/2 known DJD.  Repeated cortisone injection today.  Tylenol, topical medications, aleve if needed.  F/u prn.  After informed written consent timeout was performed, patient was seated on exam table. Right knee was prepped with alcohol swab and utilizing anterolateral approach, patient's right knee was injected intraarticularly with 3:1 marcaine: depomedrol. Patient tolerated the procedure well without immediate  complications.

## 2017-07-04 ENCOUNTER — Other Ambulatory Visit: Payer: Self-pay

## 2017-07-04 ENCOUNTER — Ambulatory Visit: Payer: BC Managed Care – PPO | Admitting: Oncology

## 2017-07-04 ENCOUNTER — Encounter: Payer: Self-pay | Admitting: Oncology

## 2017-07-04 VITALS — BP 133/88 | HR 78 | Temp 98.1°F | Ht 66.5 in | Wt 287.3 lb

## 2017-07-04 DIAGNOSIS — D649 Anemia, unspecified: Secondary | ICD-10-CM | POA: Insufficient documentation

## 2017-07-04 DIAGNOSIS — D689 Coagulation defect, unspecified: Secondary | ICD-10-CM

## 2017-07-04 DIAGNOSIS — Z86711 Personal history of pulmonary embolism: Secondary | ICD-10-CM | POA: Diagnosis not present

## 2017-07-04 DIAGNOSIS — Z96652 Presence of left artificial knee joint: Secondary | ICD-10-CM | POA: Diagnosis not present

## 2017-07-04 DIAGNOSIS — D890 Polyclonal hypergammaglobulinemia: Secondary | ICD-10-CM | POA: Diagnosis not present

## 2017-07-04 DIAGNOSIS — M170 Bilateral primary osteoarthritis of knee: Secondary | ICD-10-CM

## 2017-07-04 DIAGNOSIS — E669 Obesity, unspecified: Secondary | ICD-10-CM

## 2017-07-04 DIAGNOSIS — Z6841 Body Mass Index (BMI) 40.0 and over, adult: Secondary | ICD-10-CM | POA: Diagnosis not present

## 2017-07-04 DIAGNOSIS — Z7901 Long term (current) use of anticoagulants: Secondary | ICD-10-CM

## 2017-07-04 HISTORY — DX: Polyclonal hypergammaglobulinemia: D89.0

## 2017-07-04 HISTORY — DX: Anemia, unspecified: D64.9

## 2017-07-04 MED ORDER — RIVAROXABAN 10 MG PO TABS: 10.0000 mg | ORAL_TABLET | Freq: Every day | ORAL | 3 refills | Status: AC

## 2017-07-04 NOTE — Patient Instructions (Addendum)
To lab today: we will call you with results Decrease Xarelto to 10 mg daily Return visit as needed

## 2017-07-04 NOTE — Addendum Note (Signed)
Addended by: Bufford SpikesFULCHER, Briahna Pescador N on: 07/04/2017 11:45 AM   Modules accepted: Orders

## 2017-07-04 NOTE — Progress Notes (Signed)
Hematology and Oncology Follow Up Visit  Laura [name] [id number] February 14, 1956 62 y.o. 07/04/2017 11:00 AM   Principle Diagnosis: Encounter Diagnoses  Name Primary?  . Coagulopathy (HCC)   . Normochromic anemia Yes  . Polyclonal gammopathy   Clinical summary: 62 year-old [employer]. 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 First event in November 2005. Recurrence in October 2007. She was first evaluated by me in July 2008. 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 There is epidemiologic evidence that this is a risk factor for clotting with the highest prevalence in African-American and  Scandinavian populations. She has been on chronic, full dose anticoagulation.  She was on chronic coumadin  until 2017 when she transitioned to Xarelto. She has had no subsequent thrombotic events. Additional testing showednormal antithrombin III level, negative for the factor V Leiden and prothrombin gene mutations, Negative antiphospholipid antibodies and beta 2 glycoprotein 1 antibodies, negative ANA, hepatitis C antibody negative, protein electrophoresis with a mild polyclonal elevation of both IgG and IgA at 2120 mg percent and 441 mg percent respectively. She had a mild normochromic anemia with borderline decrease ferritin of 13. She was put on iron and her hemoglobin started to come up.  She has chronic degenerative arthritis of her knees. She has required intermittent steroid injections. She had a left total knee replacement in June 2018.   Interim History: Since last visit with me she had her left knee replaced and is doing well.  No other interim medical issues.  She voiced some concern about her chronic lower extremity nonpitting edema. She denies any dyspnea, chest pain, palpitations, significant change in vision, headache, change in bowel habit.  No bleeding issues on Xarelto.  Medications: reviewed  Allergies: No Known Allergies  Review  of Systems: See interim history Remaining ROS negative:   Physical Exam: Blood pressure 133/88, pulse 78, temperature 98.1 F (36.7 C), temperature source Oral, height 5' 6.5" (1.689 m), weight 287 lb 4.8 oz (130.3 kg), SpO2 98 %. Wt Readings from Last 3 Encounters:  07/04/17 287 lb 4.8 oz (130.3 kg)  06/14/17 250 lb (113.4 kg)  09/05/16 284 lb 4 oz (128.9 kg)     General appearance: Pleasant, obese, African-American woman BMI 45.7 HENNT: Pharynx no erythema, exudate, mass, or ulcer. No thyromegaly or thyroid nodules Lymph nodes: No cervical, supraclavicular, or axillary lymphadenopathy Breasts:  Lungs: Crackles at the bases bilaterally, resonant to percussion throughout Heart: Regular rhythm, no murmur, no gallop, no rub, no click, no edema Abdomen: Soft, nontender, normal bowel sounds, no mass, no organomegaly Extremities: No edema, no calf tenderness.  She does have large legs. Musculoskeletal: no joint deformities GU:  Vascular: Carotid pulses 2+, no bruits,  Neurologic: Alert, oriented, PERRLA, I was unable to visualize the optic discs.  I believe she has bilateral cataracts worse on the right. cranial nerves grossly normal, motor strength 5 over 5, reflexes 1+ symmetric, upper body coordination normal, gait normal, Skin: No rash or ecchymosis  Lab Results: CBC W/Diff    Component Value Date/Time   WBC 13.8 (H) 09/08/2016 0409   RBC 3.63 (L) 09/08/2016 0409   HGB 10.3 (L) 09/08/2016 0409   HGB 12.2 04/25/2016 0906   HGB 12.8 05/29/2012 1502   HCT 33.6 (L) 09/08/2016 0409   HCT 38.0 04/25/2016 0906   HCT 40.0 05/29/2012 1502   PLT 276 09/08/2016 0409   PLT 311 04/25/2016 0906   MCV 92.6 09/08/2016 0409   MCV 89  04/25/2016 0906   MCV 90.3 05/29/2012 1502   MCH 28.4 09/08/2016 0409   MCHC 30.7 09/08/2016 0409   RDW 14.5 09/08/2016 0409   RDW 14.4 04/25/2016 0906   RDW 14.9 (H) 05/29/2012 1502   LYMPHSABS 2.4 04/25/2016 0906   LYMPHSABS 2.9 05/29/2012 1502    MONOABS 0.7 05/13/2014 1158   MONOABS 0.7 05/29/2012 1502   EOSABS 0.4 04/25/2016 0906   BASOSABS 0.0 04/25/2016 0906   BASOSABS 0.0 05/29/2012 1502     Chemistry      Component Value Date/Time   NA 134 (L) 09/07/2016 0437   NA 142 03/25/2016 1435   K 3.6 09/07/2016 0437   CL 95 (L) 09/07/2016 0437   CO2 31 09/07/2016 0437   BUN 13 09/07/2016 0437   BUN 15 03/25/2016 1435   CREATININE 0.85 09/07/2016 0437   CREATININE 0.83 05/13/2014 1158      Component Value Date/Time   CALCIUM 8.6 (L) 09/07/2016 0437   ALKPHOS 56 09/01/2016 1001   AST 16 09/01/2016 1001   ALT 14 09/01/2016 1001   BILITOT 0.6 09/01/2016 1001   BILITOT 0.4 03/25/2016 1435       Radiological Studies: No results found.  Impression:  1.  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 We discussed reducing her Xarelto dose by 50% down to 10 mg daily. In favor of this is the fact that she is out over 12 years from her last thrombotic event.  In addition, we do not know how strong a risk factor elevated factor VIII is and whether that puts her at low intermediate or high risk for another event. We discussed the current medical literature.  Patient's at high risk were excluded from both the Xarelto and the Eliquis studies looking at dose reduction. A prior but contemporary study in the Coumadin era showed an unacceptable risk of stopping anticoagulation after 2 years of full dose anticoagulation for unprovoked PE.  The other genetic factors working against her is her weight although this is a relatively minor risk factor by itself, another handicap of the clinical trials was that they did not have a large percentage of patients at extremes of low or high weights.  There is limited data to know whether or not even the 20 mg Xarelto dose is adequate in morbidly obese patients.  Clotting risk also increases with age and she will be 62 years  old this year.  Trying to balance the risks of full dose anticoagulation (bleeding) against the protective effect against recurrent clots and following the discussion above, we mutually agreed to a trial of Xarelto at 10 mg daily.  She understands that if she does have another event, she will be committed to the standard therapeutic dose.  2.  Chronic normochromic anemia Myeloma screening tests showed a mild polyclonal gammopathy which has been stable over time.  I will repeat values again today as well as a CBC.  No gross evidence for a hemolytic process with normal bilirubin.  I am checking a retake count and LDH today. She is up-to-date on her health maintenance exams.  Pap smear negative in July 2017.  Mammogram negative in July 2018.  Colonoscopy negative in February 2018.  3.  Large legs I think this is just an anatomic variation of no clinical concern.  I informed her today that I would be retiring early next year.  She is welcome to call me before that time  for any heme issues.  CC: Patient Care Team: Laurann MontanaWhite, Cynthia, MD as PCP - General (Family Medicine)    Cephas DarbyJames Granfortuna, MD, FACP  Hematology-Oncology/Internal Medicine             : Encounter Diagnoses  Name Primary?        Yes                Blood pressure 133/88, pulse 78, temperature 98.1 F (36.7 C), temperature source Oral, height 5' 6.5" (1.689 m), weight 287 lb 4.8 oz (130.3 kg), SpO2 98 %. Wt Readings from Last 3 Encounters:  07/04/17 287 lb 4.8 oz (130.3 kg)  06/14/17 250 lb (113.4 kg)  09/05/16 284 lb 4 oz (128.9 kg)    CBC W/Diff    Component Value Date/Time   WBC 13.8 (H) 09/08/2016 0409   RBC 3.63 (L) 09/08/2016 0409   HGB 10.3 (L) 09/08/2016 0409   HGB 12.2 04/25/2016 0906   HGB 12.8 05/29/2012 1502   HCT 33.6 (L) 09/08/2016 0409   HCT 38.0 04/25/2016 0906   HCT 40.0 05/29/2012 1502   PLT 276 09/08/2016 0409   PLT 311 04/25/2016 0906   MCV 92.6 09/08/2016 0409   MCV 89  04/25/2016 0906   MCV 90.3 05/29/2012 1502   MCH 28.4 09/08/2016 0409   MCHC 30.7 09/08/2016 0409   RDW 14.5 09/08/2016 0409   RDW 14.4 04/25/2016 0906   RDW 14.9 (H) 05/29/2012 1502   LYMPHSABS 2.4 04/25/2016 0906   LYMPHSABS 2.9 05/29/2012 1502   MONOABS 0.7 05/13/2014 1158   MONOABS 0.7 05/29/2012 1502   EOSABS 0.4 04/25/2016 0906   BASOSABS 0.0 04/25/2016 0906   BASOSABS 0.0 05/29/2012 1502     Chemistry      Component Value Date/Time   NA 134 (L) 09/07/2016 0437   NA 142 03/25/2016 1435   K 3.6 09/07/2016 0437   CL 95 (L) 09/07/2016 0437   CO2 31 09/07/2016 0437   BUN 13 09/07/2016 0437   BUN 15 03/25/2016 1435   CREATININE 0.85 09/07/2016 0437   CREATININE 0.83 05/13/2014 1158      Component Value Date/Time   CALCIUM 8.6 (L) 09/07/2016 0437   ALKPHOS 56 09/01/2016 1001   AST 16 09/01/2016 1001   ALT 14 09/01/2016 1001   BILITOT 0.6 09/01/2016 1001   BILITOT 0.4 03/25/2016 1435             4/23/201911:04 AM

## 2017-07-05 LAB — LACTATE DEHYDROGENASE: LDH: 228 IU/L — AB (ref 119–226)

## 2017-07-06 LAB — CBC WITH DIFFERENTIAL/PLATELET
BASOS ABS: 0 10*3/uL (ref 0.0–0.2)
BASOS: 0 %
EOS (ABSOLUTE): 0.4 10*3/uL (ref 0.0–0.4)
Eos: 5 %
HEMOGLOBIN: 11.8 g/dL (ref 11.1–15.9)
Hematocrit: 37.6 % (ref 34.0–46.6)
Immature Grans (Abs): 0 10*3/uL (ref 0.0–0.1)
Immature Granulocytes: 0 %
LYMPHS ABS: 2.3 10*3/uL (ref 0.7–3.1)
Lymphs: 29 %
MCH: 28.3 pg (ref 26.6–33.0)
MCHC: 31.4 g/dL — AB (ref 31.5–35.7)
MCV: 90 fL (ref 79–97)
Monocytes Absolute: 0.8 10*3/uL (ref 0.1–0.9)
Monocytes: 10 %
NEUTROS ABS: 4.4 10*3/uL (ref 1.4–7.0)
Neutrophils: 56 %
Platelets: 289 10*3/uL (ref 150–379)
RBC: 4.17 x10E6/uL (ref 3.77–5.28)
RDW: 14.7 % (ref 12.3–15.4)
WBC: 7.9 10*3/uL (ref 3.4–10.8)

## 2017-07-06 LAB — IMMUNOFIXATION ELECTROPHORESIS
IGA/IMMUNOGLOBULIN A, SERUM: 426 mg/dL — AB (ref 87–352)
IGM (IMMUNOGLOBULIN M), SRM: 86 mg/dL (ref 26–217)
IgG (Immunoglobin G), Serum: 2116 mg/dL — ABNORMAL HIGH (ref 700–1600)
TOTAL PROTEIN: 7.3 g/dL (ref 6.0–8.5)

## 2017-07-06 LAB — RETICULOCYTES: Retic Ct Pct: 1.6 % (ref 0.6–2.6)

## 2017-07-13 ENCOUNTER — Telehealth: Payer: Self-pay | Admitting: *Deleted

## 2017-07-13 NOTE — Telephone Encounter (Signed)
-----   Message from Levert Feinstein, MD sent at 07/13/2017  8:43 AM EDT ----- Call pt: 2 of 3 antibodies elevated, minimal increase compared with previous, non-specific pattern associated with acute or chronic inflammation or liver inflammation. Perfectly normal liver tests when we checked last time. My guess is that this may have something to do with the elevated factor VIII level which can also be elevated non specifically with chronic inflammation. Hemoglobin improved - up from 10.3 to 11.8. I am happy to discuss with her by phone if she gives me a convenient time to call her.

## 2017-07-13 NOTE — Telephone Encounter (Signed)
Called pt - no answer; left message to call back and give [place] a good time for Dr Cyndie Chime to call her back to explain lab results.

## 2017-07-13 NOTE — Telephone Encounter (Signed)
Pt had called back - explained Dr Reece Agar would like to explain lab results;message given to Dr Reece Agar.

## 2017-07-19 NOTE — Telephone Encounter (Signed)
I called pt & discussed results - also sent note to her primary care. DrG

## 2017-08-01 ENCOUNTER — Other Ambulatory Visit: Payer: Self-pay

## 2017-08-01 DIAGNOSIS — M7989 Other specified soft tissue disorders: Secondary | ICD-10-CM

## 2017-09-22 ENCOUNTER — Ambulatory Visit: Payer: BC Managed Care – PPO | Admitting: Family Medicine

## 2017-09-22 ENCOUNTER — Encounter: Payer: Self-pay | Admitting: Family Medicine

## 2017-09-22 DIAGNOSIS — G8929 Other chronic pain: Secondary | ICD-10-CM | POA: Diagnosis not present

## 2017-09-22 DIAGNOSIS — M25561 Pain in right knee: Secondary | ICD-10-CM | POA: Diagnosis not present

## 2017-09-22 MED ORDER — METHYLPREDNISOLONE ACETATE 40 MG/ML IJ SUSP
40.0000 mg | Freq: Once | INTRAMUSCULAR | Status: AC
  Administered 2017-09-22: 40 mg via INTRA_ARTICULAR

## 2017-09-24 ENCOUNTER — Encounter: Payer: Self-pay | Admitting: Family Medicine

## 2017-09-24 NOTE — Progress Notes (Signed)
Patient ID: Laura [name], female   DOB: 02-Mar-1956, 62 y.o.   MRN: [id number]  Subjective:   PCP: Dr. Dema Severin  Knee Pain    62 yo F here for right knee pain  02/29/12: Patient has known history of mod-severe DJD of bilateral knees. Last cortisone injections 3 months ago - started wearing off recently and going on a trip to [place] Has h/o PEs, on anticoagulation with coumadin - last INR in past week was 2.0. Pain currently worse in right knee than left - more lateral than medial but worse medially on left knee.  09/10/13: Patient returns today for bilateral knee injections. Tried synvisc twice since last visit here. First set helped for 3 months - second one did not help. At least a few months relief with cortisone shots in past. No new injuries.  01/15/14: Patient reports her knees started bothering her a couple weeks ago. Did well with cortisone shots. Taking tylenol.  04/17/14: Patient returns for bilateral knee injections for DJD. Did well since last visit and just recently they started bothering her again. Left knee 6/10 pain, right 4/10. No catching, locking, giving out.  4/18: Patient returns for repeat injections - both knees 3/10 level. She's also interested in viscosupplementation - working on getting approval Jacklyn Shell, our usual, is not approved by her insurance).  10/17: Patient returns for bilateral knee injections. Not much benefit last visit from cortisone injections - she did supartz by orthopedics and also not a whole lot of benefit. Constant dull ache in both knees 4/10 right, 6/10 left. Pain anterior, more medial. No catching, locking, giving out. Still bothered by her achilles on right as well - no new injuries. Doing some of the exercises. Has heel lifts. No skin changes, fever, other complaints.  04/08/15: Patient returns as pain started to recur past couple weeks in both knees. Pain level 0/10 at rest, up to 4/10 on left, 2/10 on right  especially with walking. Pain is sharp, anteromedial. Worse when getting up from seated position also. No skin changes, fever, other complaints.  4/26: Patient returns for bilateral knee injections. Pain level 5/10 bilaterally now. Worse with walking. No skin changes, numbness.  7/18: Patient returns with 3/10 pain in both knees. Worse first thing in morning, when getting up from prolonged sitting. Pain is dull. No skin changes, numbness. She would like bilateral knee injections.  12/25/15: Patient reports her knee pain has worsened. Pain right knee 4/10, left 6/10 anterior, dull. Worse with walking. No pain at rest. Would like to try viscosupplementation if cortisone injections don't help as much this time. No skin changes, numbness.  07/15/16: Patient returns with pain in both knees - up to 6/10 on left, 3/10 on right and sharp, anterior. Pain is 0/10 at rest bilaterally. Worse with ambulation. No skin changes, numbness.  06/14/17: Patient is doing very well from her knee replacement of left knee. Right knee has started worsening over last few weeks. Pain level 5/10, worse with walking. No skin changes, numbness.  7/12: Patient reports her right knee started flaring up about 1 month ago. Worse with walking. Pain currently 0/10 at rest. No skin changes,numbness  Past Medical History:  Diagnosis Date  . Anemia, normocytic normochromic 07/04/2017   Chronic Hb 10 +/- 0.5 gms  . Edema   . 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 Herpes   . High blood pressure   . History of blood clots   . Obesity   .  Partial tear of Achilles tendon   . Pneumonia   . 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 Pulmonary embolism ([place])   . 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 Vitamin D deficiency     Current Outpatient Medications on File Prior to Visit   Medication Sig Dispense Refill  . acetaminophen (TYLENOL) 650 MG CR tablet Take 1,300 mg by mouth 2 (two) times daily as needed for pain.    . carvedilol (COREG) 6.25 MG tablet Take 6.25 mg by mouth 2 (two) times daily.     . Cholecalciferol 5000 UNITS capsule Take 5,000 Units by mouth daily.    . furosemide (LASIX) 20 MG tablet   1  . hydrochlorothiazide (HYDRODIURIL) 25 MG tablet Take 25 mg by mouth daily.    Marland Kitchen lisinopril-hydrochlorothiazide (PRINZIDE,ZESTORETIC) 20-25 MG tablet TAKE 1 TABLET BY MOUTH ONCE DAILY FOR 90 DAYS  1  . Potassium Chloride ER 20 MEQ TBCR   1  . PROAIR HFA 108 (90 Base) MCG/ACT inhaler INHALE 2 PUFFS BY MOUTH AS NEEDED 20 MINUTES PRIOR TO EXERCISE AND 2 PUFFS EVERY 4 HOURS AS NEEDED FOR SHORTNESS OF BREATH  1  . rivaroxaban (XARELTO) 10 MG TABS tablet Take 1 tablet (10 mg total) by mouth daily. 90 tablet 3  . valACYclovir (VALTREX) 500 MG tablet Take 500 mg by mouth daily as needed (for fever blisters/cold sores.).      No current facility-administered medications on file prior to visit.     Past Surgical History:  Procedure Laterality Date  . BREAST BIOPSY    . [place]  . 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 Marital status: Married    Spouse name: Not on file  . Number of children: 2  . Years of education: Not on file  . Highest education level: Not on file  Occupational History    Employer: [employer]  Social Needs  . Financial resource strain: Not on file  . Food insecurity:    Worry: Not on file    Inability: Not on file  . Transportation needs:    Medical: Not on file    Non-medical: Not on file  Tobacco Use  . Smoking status: Never Smoker  . Smokeless tobacco: Never Used  Substance and Sexual Activity  .  Alcohol use: Yes    Alcohol/week: 0.0 oz    Comment: Rarely.  . Drug use: No  . Sexual activity: Not on file  Lifestyle  . Physical activity:    Days per week: Not on file    Minutes per session: Not on file  . Stress: Not on file  Relationships  . Social connections:    Talks on phone: Not on file    Gets together: Not on file    Attends religious service: Not on file    Active member of club or organization: Not on file    Attends meetings of clubs or organizations: Not on file    Relationship status: Not on file  . Intimate partner violence:    Fear of current or ex partner: Not on file    Emotionally  abused: Not on file    Physically abused: Not on file    Forced sexual activity: Not on file  Other Topics Concern  . Not on file  Social History Narrative  . Not on file    Family History  Problem Relation Age of Onset  . Hypertension Mother   . Heart failure Mother   . Prostate cancer Father   . Multiple myeloma Father   . Heart failure Father   . Heart attack Neg Hx   . Hyperlipidemia Neg Hx   . Diabetes Neg Hx   . Sudden death Neg Hx     BP 118/86   Pulse 73   Ht 5' 7"  (1.702 m)   Wt 265 lb (120.2 kg)   BMI 41.50 kg/m   Review of Systems See HPI above.    Objective:   Physical Exam  Gen: NAD, comfortable in exam room  Right knee: No gross deformity, ecchymoses, swelling. TTP medial, lateral joint lines, post patellar facets. FROM with 5/5 strength. Negative ant/post drawers. Negative valgus/varus testing. Negative lachmanns. Negative mcmurrays, apleys, patellar apprehension. NV intact distally.     Assessment & Plan:  1. Right knee pain - 2/2 known DJD.  Repeated cortisone injection.  Tylenol, topical medications if needed.  F/u prn.  After informed written consent timeout was performed, patient was seated on exam table. Right knee was prepped with alcohol swab and utilizing anterolateral approach, patient's right knee was injected  intraarticularly with 3:1 bupivicaine: depomedrol. Patient tolerated the procedure well without immediate complications.

## 2017-09-24 NOTE — Assessment & Plan Note (Signed)
2/2 known DJD.  Repeated cortisone injection.  Tylenol, topical medications if needed.  F/u prn.  After informed written consent timeout was performed, patient was seated on exam table. Right knee was prepped with alcohol swab and utilizing anterolateral approach, patient's right knee was injected intraarticularly with 3:1 bupivicaine: depomedrol. Patient tolerated the procedure well without immediate complications.

## 2017-09-25 ENCOUNTER — Ambulatory Visit (HOSPITAL_COMMUNITY)
Admission: RE | Admit: 2017-09-25 | Discharge: 2017-09-25 | Disposition: A | Discharge status: Home / Self Care | Payer: BC Managed Care – PPO | Source: Ambulatory Visit | Attending: Surgery | Admitting: Surgery

## 2017-09-25 ENCOUNTER — Ambulatory Visit (INDEPENDENT_AMBULATORY_CARE_PROVIDER_SITE_OTHER): Payer: BC Managed Care – PPO | Admitting: Surgery

## 2017-09-25 ENCOUNTER — Encounter: Payer: Self-pay | Admitting: Surgery

## 2017-09-25 VITALS — BP 120/85 | HR 78 | Temp 97.2°F | Resp 22 | Wt 282.0 lb

## 2017-09-25 DIAGNOSIS — M7989 Other specified soft tissue disorders: Secondary | ICD-10-CM | POA: Diagnosis present

## 2017-09-25 NOTE — Progress Notes (Signed)
[facility]  Patient name: Laura [name] MRN: [id number] DOB: 1955-09-27 Sex: female   REQUESTING PROVIDER:    Dr. Dema Severin   REASON FOR CONSULT:    Leg swelling  HISTORY OF PRESENT ILLNESS:   Laura [name] is a 62 y.o. female, who is referred today for evaluation of bilateral leg swelling.  She states that the left may be slightly worse than the right.  It has been going on for approximately 5 years.  It tends to be worse in the summer months and nearly goes away in the winter months.  Her swelling appears to be worse at the end of the day.  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  She takes Xarelto for anticoagulation.  Patient is medically managed for hypertension.    She is a non-smoker.  PAST MEDICAL HISTORY    Past Medical History:  Diagnosis Date  . Anemia, normocytic normochromic 07/04/2017   Chronic Hb 10 +/- 0.5 gms  . Edema   . 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 Herpes   . High blood pressure   . History of blood clots   . Obesity   . Partial tear of Achilles tendon   . Pneumonia   . 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 Pulmonary embolism ([place])   . 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 Vitamin D deficiency      FAMILY HISTORY   Family History  Problem Relation Age of Onset  . Hypertension Mother   . Heart failure Mother   . Prostate cancer Father   . Multiple myeloma Father   . Heart failure Father   . Heart attack Neg Hx   . Hyperlipidemia Neg Hx   . Diabetes Neg Hx   . Sudden death Neg Hx     SOCIAL HISTORY:   Social History   Socioeconomic History  . Marital status: Married    Spouse name: Not on file  . Number of children: 2  . Years of education: Not on file  . Highest education level: Not on file   Occupational History    Employer: [employer]  Social Needs  . Financial resource strain: Not on file  . Food insecurity:    Worry: Not on file    Inability: Not on file  . Transportation needs:    Medical: Not on file    Non-medical: Not on file  Tobacco Use  . Smoking status: Never Smoker  . Smokeless tobacco: Never Used  Substance and Sexual Activity  . Alcohol use: Yes    Alcohol/week: 0.0 oz    Comment: Rarely.  . Drug use: No  . Sexual activity: Not on file  Lifestyle  . Physical activity:    Days per week: Not on file    Minutes per session: Not on file  . Stress: Not on file  Relationships  . Social connections:    Talks on phone: Not on file    Gets together: Not on file    Attends religious service: Not on file    Active member of club or organization: Not on file    Attends meetings of clubs or organizations: Not on file    Relationship status: Not on file  . Intimate partner violence:    Fear  of current or ex partner: Not on file    Emotionally abused: Not on file    Physically abused: Not on file    Forced sexual activity: Not on file  Other Topics Concern  . Not on file  Social History Narrative  . Not on file    ALLERGIES:    No Known Allergies  CURRENT MEDICATIONS:    Current Outpatient Medications  Medication Sig Dispense Refill  . acetaminophen (TYLENOL) 650 MG CR tablet Take 1,300 mg by mouth 2 (two) times daily as needed for pain.    . Cholecalciferol 5000 UNITS capsule Take 5,000 Units by mouth daily.    Marland Kitchen lisinopril-hydrochlorothiazide (PRINZIDE,ZESTORETIC) 20-25 MG tablet TAKE 1 TABLET BY MOUTH ONCE DAILY FOR 90 DAYS  1  . PROAIR HFA 108 (90 Base) MCG/ACT inhaler INHALE 2 PUFFS BY MOUTH AS NEEDED 20 MINUTES PRIOR TO EXERCISE AND 2 PUFFS EVERY 4 HOURS AS NEEDED FOR SHORTNESS OF BREATH  1  . rivaroxaban (XARELTO) 10 MG TABS tablet Take 1 tablet (10 mg total) by mouth daily. 90 tablet 3  . valACYclovir (VALTREX) 500 MG tablet Take 500  mg by mouth daily as needed (for fever blisters/cold sores.).     Marland Kitchen carvedilol (COREG) 6.25 MG tablet Take 6.25 mg by mouth 2 (two) times daily.     . furosemide (LASIX) 20 MG tablet   1  . hydrochlorothiazide (HYDRODIURIL) 25 MG tablet Take 25 mg by mouth daily.    . Potassium Chloride ER 20 MEQ TBCR   1   No current facility-administered medications for this visit.     REVIEW OF SYSTEMS:   [X]  denotes positive finding, [ ]  denotes negative finding Cardiac  Comments:  Chest pain or chest pressure:    Shortness of breath upon exertion:    Short of breath when lying flat:    Irregular heart rhythm:        Vascular    Pain in calf, thigh, or hip brought on by ambulation:    Pain in feet at night that wakes you up from your sleep:     Blood clot in your veins:    Leg swelling:  x       Pulmonary    Oxygen at home:    Productive cough:     Wheezing:         Neurologic    Sudden weakness in arms or legs:     Sudden numbness in arms or legs:     Sudden onset of difficulty speaking or slurred speech:    Temporary loss of vision in one eye:     Problems with dizziness:         Gastrointestinal    Blood in stool:      Vomited blood:         Genitourinary    Burning when urinating:     Blood in urine:        Psychiatric    Major depression:         Hematologic    Bleeding problems:    Problems with blood clotting too easily:        Skin    Rashes or ulcers:        Constitutional    Fever or chills:     PHYSICAL EXAM:   Vitals:   09/25/17 1216  BP: 120/85  Pulse: 78  Resp: (!) 22  Temp: (!) 97.2 F (36.2 C)  SpO2: 97%  Weight: 282 lb (  127.9 kg)    GENERAL: The patient is a well-nourished female, in no acute distress. The vital signs are documented above. CARDIAC: There is a regular rate and rhythm.  VASCULAR: Pedal pulses are not palpable.  Nonpitting bilateral edema. PULMONARY: Nonlabored respirations MUSCULOSKELETAL: There are no major deformities or  cyanosis. NEUROLOGIC: No focal weakness or paresthesias are detected. SKIN: There are no ulcers or rashes noted. PSYCHIATRIC: The patient has a normal affect.  STUDIES:   I have ordered and reviewed venous reflux study. Right: Abnormal reflux times found in the common femoral, popliteal vein.  Negative for DVT Left: Abnormal reflux times within the saphenofemoral junction and small saphenous junction to the popliteal vein  ASSESSMENT and PLAN   Leg swelling: The patient's edema is mostly nonpitting.  It does get worse at the end of the day.  She has mild venous insufficiency on ultrasound which may be responsible for her worsening symptoms at the end of the day.  No intervention is recommended based on her vascular lab studies.  We discussed treatment strategies which would include considering purchasing of a lymphedema pump, keeping her legs elevated whenever possible, weight loss, and compression stockings.  I think the mainstay of her treatment should be compression stockings, however she will need to get properly fitted and will likely need assistance with getting them on.  She will contact me if she has any further questions.   Annamarie Major, MD [facility] [phone] Pager [phone]

## 2017-12-12 ENCOUNTER — Ambulatory Visit: Payer: BC Managed Care – PPO | Admitting: Family Medicine

## 2017-12-26 ENCOUNTER — Other Ambulatory Visit: Payer: Self-pay | Admitting: Family Medicine

## 2017-12-26 DIAGNOSIS — Z1231 Encounter for screening mammogram for malignant neoplasm of breast: Secondary | ICD-10-CM

## 2017-12-28 ENCOUNTER — Encounter (INDEPENDENT_AMBULATORY_CARE_PROVIDER_SITE_OTHER): Payer: BC Managed Care – PPO

## 2018-01-01 ENCOUNTER — Encounter: Payer: Self-pay | Admitting: Family Medicine

## 2018-01-01 ENCOUNTER — Ambulatory Visit (INDEPENDENT_AMBULATORY_CARE_PROVIDER_SITE_OTHER): Payer: BC Managed Care – PPO | Admitting: Family Medicine

## 2018-01-01 VITALS — BP 137/95 | HR 84 | Ht 67.0 in | Wt 270.0 lb

## 2018-01-01 DIAGNOSIS — M25561 Pain in right knee: Secondary | ICD-10-CM | POA: Diagnosis not present

## 2018-01-01 DIAGNOSIS — M1711 Unilateral primary osteoarthritis, right knee: Secondary | ICD-10-CM | POA: Diagnosis not present

## 2018-01-01 DIAGNOSIS — G8929 Other chronic pain: Secondary | ICD-10-CM

## 2018-01-01 MED ORDER — METHYLPREDNISOLONE ACETATE 40 MG/ML IJ SUSP
40.0000 mg | Freq: Once | INTRAMUSCULAR | Status: AC
  Administered 2018-01-01: 40 mg via INTRA_ARTICULAR

## 2018-01-01 NOTE — Progress Notes (Signed)
. Patient ID: Laura [name], female   DOB: [birth year], 62 y.o.   MRN: [id number]  Subjective:   PCP: Dr. Dema Severin  Knee Pain    62 yo F here for right knee pain  02/29/12: Patient has known history of mod-severe DJD of bilateral knees. Last cortisone injections 3 months ago - started wearing off recently and going on a trip to [place] Has h/o PEs, on anticoagulation with coumadin - last INR in past week was 2.0. Pain currently worse in right knee than left - more lateral than medial but worse medially on left knee.  09/10/13: Patient returns today for bilateral knee injections. Tried synvisc twice since last visit here. First set helped for 3 months - second one did not help. At least a few months relief with cortisone shots in past. No new injuries.  01/15/14: Patient reports her knees started bothering her a couple weeks ago. Did well with cortisone shots. Taking tylenol.  04/17/14: Patient returns for bilateral knee injections for DJD. Did well since last visit and just recently they started bothering her again. Left knee 6/10 pain, right 4/10. No catching, locking, giving out.  4/18: Patient returns for repeat injections - both knees 3/10 level. She's also interested in viscosupplementation - working on getting approval Jacklyn Shell, our usual, is not approved by her insurance).  10/17: Patient returns for bilateral knee injections. Not much benefit last visit from cortisone injections - she did supartz by orthopedics and also not a whole lot of benefit. Constant dull ache in both knees 4/10 right, 6/10 left. Pain anterior, more medial. No catching, locking, giving out. Still bothered by her achilles on right as well - no new injuries. Doing some of the exercises. Has heel lifts. No skin changes, fever, other complaints.  04/08/15: Patient returns as pain started to recur past couple weeks in both knees. Pain level 0/10 at rest, up to 4/10 on left, 2/10 on  right especially with walking. Pain is sharp, anteromedial. Worse when getting up from seated position also. No skin changes, fever, other complaints.  4/26: Patient returns for bilateral knee injections. Pain level 5/10 bilaterally now. Worse with walking. No skin changes, numbness.  7/18: Patient returns with 3/10 pain in both knees. Worse first thing in morning, when getting up from prolonged sitting. Pain is dull. No skin changes, numbness. She would like bilateral knee injections.  12/25/15: Patient reports her knee pain has worsened. Pain right knee 4/10, left 6/10 anterior, dull. Worse with walking. No pain at rest. Would like to try viscosupplementation if cortisone injections don't help as much this time. No skin changes, numbness.  07/15/16: Patient returns with pain in both knees - up to 6/10 on left, 3/10 on right and sharp, anterior. Pain is 0/10 at rest bilaterally. Worse with ambulation. No skin changes, numbness.  06/14/17: Patient is doing very well from her knee replacement of left knee. Right knee has started worsening over last few weeks. Pain level 5/10, worse with walking. No skin changes, numbness.  7/12: Patient reports her right knee started flaring up about 1 month ago. Worse with walking. Pain currently 0/10 at rest. No skin changes,numbness  10/21: Patient returns for right knee pain. 0/10 at rest, 6/10 with activity. She got about 3 months of relief from previous steroid injection. No swelling or erythema. No locking or giving out. No skin changes  Past Medical History:  Diagnosis Date  . Anemia, normocytic normochromic 07/04/2017   Chronic Hb 10 +/-  0.5 gms  . Edema   . 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 Herpes   . High blood pressure   . History of blood clots   . Obesity   . Partial tear of Achilles tendon   . Pneumonia   . 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 Pulmonary embolism ([place])   . 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 Vitamin D deficiency     Current Outpatient Medications on File Prior to Visit  Medication Sig Dispense Refill  . acetaminophen (TYLENOL) 650 MG CR tablet Take 1,300 mg by mouth 2 (two) times daily as needed for pain.    . carvedilol (COREG) 6.25 MG tablet Take 6.25 mg by mouth 2 (two) times daily.     . Cholecalciferol 5000 UNITS capsule Take 5,000 Units by mouth daily.    . furosemide (LASIX) 20 MG tablet   1  . hydrochlorothiazide (HYDRODIURIL) 25 MG tablet Take 25 mg by mouth daily.    Marland Kitchen lisinopril-hydrochlorothiazide (PRINZIDE,ZESTORETIC) 20-25 MG tablet TAKE 1 TABLET BY MOUTH ONCE DAILY FOR 90 DAYS  1  . Potassium Chloride ER 20 MEQ TBCR   1  . PROAIR HFA 108 (90 Base) MCG/ACT inhaler INHALE 2 PUFFS BY MOUTH AS NEEDED 20 MINUTES PRIOR TO EXERCISE AND 2 PUFFS EVERY 4 HOURS AS NEEDED FOR SHORTNESS OF BREATH  1  . rivaroxaban (XARELTO) 10 MG TABS tablet Take 1 tablet (10 mg total) by mouth daily. 90 tablet 3  . valACYclovir (VALTREX) 500 MG tablet Take 500 mg by mouth daily as needed (for fever blisters/cold sores.).      No current facility-administered medications on file prior to visit.     Past Surgical History:  Procedure Laterality Date  . BREAST BIOPSY    . [place]  . 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 Marital status: Married    Spouse name: Not on file  . Number of children: 2  . Years of education: Not on file  . Highest education level: Not on file  Occupational History    Employer: [employer]  Social Needs  . Financial resource strain: Not on file  . Food insecurity:    Worry: Not on file     Inability: Not on file  . Transportation needs:    Medical: Not on file    Non-medical: Not on file  Tobacco Use  . Smoking status: Never Smoker  . Smokeless tobacco: Never Used  Substance and Sexual Activity  . Alcohol use: Yes    Alcohol/week: 0.0 standard drinks    Comment: Rarely.  . Drug use: No  . Sexual activity: Not on file  Lifestyle  . Physical activity:    Days per week: Not on file    Minutes per session: Not on file  . Stress: Not on file  Relationships  . Social connections:    Talks on phone: Not on file    Gets together: Not on file    Attends religious service: Not on file    Active member of club or organization: Not on file  Attends meetings of clubs or organizations: Not on file    Relationship status: Not on file  . Intimate partner violence:    Fear of current or ex partner: Not on file    Emotionally abused: Not on file    Physically abused: Not on file    Forced sexual activity: Not on file  Other Topics Concern  . Not on file  Social History Narrative  . Not on file    Family History  Problem Relation Age of Onset  . Hypertension Mother   . Heart failure Mother   . Prostate cancer Father   . Multiple myeloma Father   . Heart failure Father   . Heart attack Neg Hx   . Hyperlipidemia Neg Hx   . Diabetes Neg Hx   . Sudden death Neg Hx     BP (!) 137/95   Pulse 84   Ht 5' 7"  (1.702 m)   Wt 270 lb (122.5 kg)   BMI 42.29 kg/m   Review of Systems See HPI above.    Objective:   Physical Exam  GEN: awake, alert, NAD  Right knee: No obvious deformity or swelling Mild medial joint line TTP Full ROM with 5/5 strength  NV intact    Assessment & Plan:  1. Right knee pain secondary to known OA - steroid injection performed again today. Pt is considering pursuing TKA. She will schedule with Antionette Char (who did her left knee) when she is ready to proceed.  Tylenol, topical medications, home exercises.  Procedure performed: knee  intraarticular corticosteroid injection; [place] assisted  Consent obtained and verified. Time-out conducted. Noted no overlying erythema, induration, or other signs of local infection. The right lateral joint space was identified on [place] and marked. The overlying skin was prepped in a sterile fashion. Topical analgesic spray: Ethyl chloride. Joint: left knee Needle: 25ga, 1.5" Completed without difficulty. Meds: depomedrol 61m, bupivicaine 3cc  Advised to call if fevers/chills, erythema, induration, drainage, or persistent bleeding.

## 2018-01-08 ENCOUNTER — Ambulatory Visit (INDEPENDENT_AMBULATORY_CARE_PROVIDER_SITE_OTHER): Payer: BC Managed Care – PPO | Admitting: Family Medicine

## 2018-01-17 ENCOUNTER — Ambulatory Visit (INDEPENDENT_AMBULATORY_CARE_PROVIDER_SITE_OTHER): Payer: BC Managed Care – PPO | Admitting: Family Medicine

## 2018-01-17 ENCOUNTER — Encounter (INDEPENDENT_AMBULATORY_CARE_PROVIDER_SITE_OTHER): Payer: Self-pay | Admitting: Family Medicine

## 2018-01-17 VITALS — BP 100/69 | HR 80 | Temp 97.9°F | Ht 66.0 in | Wt 278.0 lb

## 2018-01-17 DIAGNOSIS — Z9189 Other specified personal risk factors, not elsewhere classified: Secondary | ICD-10-CM

## 2018-01-17 DIAGNOSIS — R5383 Other fatigue: Secondary | ICD-10-CM

## 2018-01-17 DIAGNOSIS — Z1331 Encounter for screening for depression: Secondary | ICD-10-CM

## 2018-01-17 DIAGNOSIS — I10 Essential (primary) hypertension: Secondary | ICD-10-CM | POA: Diagnosis not present

## 2018-01-17 DIAGNOSIS — R0602 Shortness of breath: Secondary | ICD-10-CM | POA: Diagnosis not present

## 2018-01-17 DIAGNOSIS — Z0289 Encounter for other administrative examinations: Secondary | ICD-10-CM

## 2018-01-17 DIAGNOSIS — E638 Other specified nutritional deficiencies: Secondary | ICD-10-CM | POA: Diagnosis not present

## 2018-01-17 DIAGNOSIS — Z6841 Body Mass Index (BMI) 40.0 and over, adult: Secondary | ICD-10-CM

## 2018-01-17 NOTE — Progress Notes (Signed)
.  Office: [phone]  /  Fax: [phone]   HPI:   Chief Complaint: OBESITY  Laura [name] (MR# [id number]) is a 62 y.o. female who presents on 01/17/2018 for obesity evaluation and treatment. Current BMI is Body mass index is 44.87 kg/m.Marland Kitchen Laura [name] has struggled with obesity for years and has been unsuccessful in either losing weight or maintaining long term weight loss. Laura [name] was told about our clinic from a friend. Laura [name] attended our information session and states she is currently in the action stage of change and ready to dedicate time achieving and maintaining a healthier weight.  Laura [name] states her family eats meals together she thinks her family will eat healthier with  her her desired weight loss is 101 lbs she has been heavy most of  her life she started gaining weight after pregnancy and children her heaviest weight ever was 281 lbs. she has significant food cravings issues  she snacks frequently in the evenings she is frequently drinking liquids with calories she struggles with emotional eating    Fatigue Laura [name] feels her energy is lower than it should be. This has worsened with weight gain and has not worsened recently. Laura [name] admits to daytime somnolence and she admits to waking up still tired. Patient is at risk for obstructive sleep apnea. Patent has a history of symptoms of daytime fatigue, morning fatigue and hypertension. Patient generally gets 7 hours of sleep per night, and states they generally have restful sleep. Apneic episodes are not present. Epworth Sleepiness Score is 4  EKG was ordered today which shows normal sinus rhythm with artifact.  Dyspnea on exertion Laura [name] notes increasing shortness of breath with exercising and seems to be worsening over time with weight gain. She notes getting out of breath sooner with activity than she used to. This has not gotten worse recently. EKG was ordered today which shows normal sinus rhythm with artifact. Analyssa denies  orthopnea.  Hypertension Laura [name] is a 62 y.o. female with hypertension. She has been on medication for ten years. Laura [name] denies chest pain, chest pressure or headache. She is working weight loss to help control her blood pressure with the goal of decreasing her risk of heart attack and stroke. Laura [name] blood pressure is currently controlled.  At risk for cardiovascular disease Laura [name] is at a higher than average risk for cardiovascular disease due to obesity and hypertension. She currently denies any chest pain.  Imbalance of nutrition Laura [name] has an imbalance of nutrition with the indulgence of fatty and fried foods.  Depression Screen Laura [name]'s Food and Mood (modified PHQ-9) score was  Depression screen PHQ 2/9 01/17/2018  Decreased Interest 1  Down, Depressed, Hopeless 0  PHQ - 2 Score 1  Altered sleeping 0  Tired, decreased energy 2  Change in appetite 1  Feeling bad or failure about yourself  1  Trouble concentrating 1  Moving slowly or fidgety/restless 2  Suicidal thoughts 0  PHQ-9 Score 8  Difficult doing work/chores Not difficult at all    ALLERGIES: No Known Allergies  MEDICATIONS: Current Outpatient Medications on File Prior to Visit  Medication Sig Dispense Refill  . lisinopril-hydrochlorothiazide (PRINZIDE,ZESTORETIC) 20-25 MG tablet TAKE 1 TABLET BY MOUTH ONCE DAILY FOR 90 DAYS  1  . rivaroxaban (XARELTO) 10 MG TABS tablet Take 1 tablet (10 mg total) by mouth daily. 90 tablet 3   No current facility-administered medications on file prior to visit.     PAST MEDICAL HISTORY: Past Medical History:  Diagnosis Date  .  Anemia, normocytic normochromic 07/04/2017   Chronic Hb 10 +/- 0.5 gms  . Edema   . 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 Herpes   . High blood pressure   . History of blood clots   . Obesity   . Partial tear of Achilles tendon   . Pneumonia   . 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 Pulmonary embolism ([place])   . 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 Vitamin D deficiency     PAST SURGICAL HISTORY: Past Surgical History:  Procedure Laterality Date  . BREAST BIOPSY    . [place]  . 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 Smoking status: Never Smoker  . Smokeless tobacco: Never Used  Substance Use Topics  . Alcohol use: Yes    Alcohol/week: 0.0 standard drinks    Comment: Rarely.  . Drug use: No    FAMILY HISTORY: Family History  Problem Relation Age of Onset  . Hypertension Mother   . Heart failure Mother   . Cancer Mother   . Depression Mother   . Sleep apnea Mother   . Obesity Mother   . Prostate cancer Father   . Multiple myeloma Father   . Heart failure Father   . Obesity Father   . Sleep apnea Father   . Kidney disease Father   . Sudden Cardiac Death Father   . Hypertension Father   . Heart attack Neg Hx   . Hyperlipidemia Neg Hx   . Diabetes Neg Hx   . Sudden death Neg Hx     ROS: Review of Systems  Constitutional: Positive for malaise/fatigue.  Eyes:       + Wear Glasses or Contacts  Respiratory: Positive for shortness of breath (with activity).   Cardiovascular: Negative for chest pain and orthopnea.       Negative for chest pressure   Musculoskeletal:       + Muscle or Joint Pain + Muscle Stiffness   Neurological: Negative for headaches.    PHYSICAL EXAM: Blood pressure 100/69, pulse 80, temperature 97.9 F (36.6 C), temperature source Oral, height 5' 6"  (1.676 m), weight 278 lb (126.1 kg), SpO2 96 %. Body mass index is 44.87 kg/m. Physical Exam  Constitutional: She is oriented to person, place, and time. She appears well-developed and  well-nourished.  HENT:  Head: Normocephalic and atraumatic.  Nose: Nose normal.  Eyes: EOM are normal. No scleral icterus.  Neck: Normal range of motion. Neck supple. No thyromegaly present.  Cardiovascular: Normal rate and regular rhythm.  Pulmonary/Chest: Effort normal. No respiratory distress.  Abdominal: Soft. There is no tenderness.  + Obesity  Musculoskeletal: Normal range of motion. She exhibits edema (2+ edema bilateral lower extremities).  Range of Motion normal in all 4 extremities  Neurological: She is alert and oriented to person, place, and time. Coordination normal.  Skin: Skin is warm and dry.  Psychiatric: She has a normal mood and affect.  Vitals reviewed.   RECENT LABS AND TESTS: BMET    Component Value Date/Time   NA 134 (L) 09/07/2016 0437   NA 142 03/25/2016  1435   K 3.6 09/07/2016 0437   CL 95 (L) 09/07/2016 0437   CO2 31 09/07/2016 0437   GLUCOSE 129 (H) 09/07/2016 0437   BUN 13 09/07/2016 0437   BUN 15 03/25/2016 1435   CREATININE 0.85 09/07/2016 0437   CREATININE 0.83 05/13/2014 1158   CALCIUM 8.6 (L) 09/07/2016 0437   GFRNONAA >60 09/07/2016 0437   GFRAA >60 09/07/2016 0437   No results found for: HGBA1C No results found for: INSULIN CBC    Component Value Date/Time   WBC 7.9 07/04/2017 0938   WBC 13.8 (H) 09/08/2016 0409   RBC 4.17 07/04/2017 0938   RBC 3.63 (L) 09/08/2016 0409   HGB 11.8 07/04/2017 0938   HGB 12.8 05/29/2012 1502   HCT 37.6 07/04/2017 0938   HCT 40.0 05/29/2012 1502   PLT 289 07/04/2017 0938   MCV 90 07/04/2017 0938   MCV 90.3 05/29/2012 1502   MCH 28.3 07/04/2017 0938   MCH 28.4 09/08/2016 0409   MCHC 31.4 (L) 07/04/2017 0938   MCHC 30.7 09/08/2016 0409   RDW 14.7 07/04/2017 0938   RDW 14.9 (H) 05/29/2012 1502   LYMPHSABS 2.3 07/04/2017 0938   LYMPHSABS 2.9 05/29/2012 1502   MONOABS 0.7 05/13/2014 1158   MONOABS 0.7 05/29/2012 1502   EOSABS 0.4 07/04/2017 0938   BASOSABS 0.0 07/04/2017 0938   BASOSABS 0.0  05/29/2012 1502   Iron/TIBC/Ferritin/ %Sat    Component Value Date/Time   IRON 41 (L) 11/20/2008 1222   TIBC 319 11/20/2008 1222   FERRITIN 29 11/20/2008 1222   IRONPCTSAT 13 (L) 11/20/2008 1222   Lipid Panel  No results found for: CHOL, TRIG, HDL, CHOLHDL, VLDL, LDLCALC, LDLDIRECT Hepatic Function Panel     Component Value Date/Time   PROT 7.3 07/04/2017 0938   ALBUMIN 3.6 09/01/2016 1001   ALBUMIN 3.7 03/25/2016 1435   AST 16 09/01/2016 1001   ALT 14 09/01/2016 1001   ALKPHOS 56 09/01/2016 1001   BILITOT 0.6 09/01/2016 1001   BILITOT 0.4 03/25/2016 1435   BILIDIR 0.1 02/18/2008 1458   IBILI 0.3 02/18/2008 1458   No results found for: TSH Vitamin D There are no recent lab results  ECG  shows NSR with a rate of 85 BPM INDIRECT CALORIMETER done today shows a VO2 of 204 and a REE of 1417. Her calculated basal metabolic rate is 1610 thus her basal metabolic rate is worse than expected.    ASSESSMENT AND PLAN: Other fatigue - Plan: EKG 12-Lead, CBC With Differential, Comprehensive metabolic panel, Hemoglobin A1c, Insulin, random, Lipid Panel With LDL/HDL Ratio, T3, T4, free, TSH, VITAMIN D 25 Hydroxy (Vit-D Deficiency, Fractures)  Shortness of breath on exertion  Essential hypertension  Imbalanced nutrition - Plan: Vitamin B12, Folate  Depression screening  At risk for heart disease  Class 3 severe obesity with serious comorbidity and body mass index (BMI) of 45.0 to 49.9 in adult, unspecified obesity type (HCC)  PLAN:  Fatigue Tashiana was informed that her fatigue may be related to obesity, depression or many other causes. Labs will be ordered, and in the meanwhile Jaimya has agreed to work on diet, exercise and weight loss to help with fatigue. Proper sleep hygiene was discussed including the need for 7-8 hours of quality sleep each night. A sleep study was not ordered based on symptoms and Epworth score. We will order indirect calorimetry and EKG today.  Dyspnea on  exertion Winnell's shortness of breath appears to be obesity related and exercise induced. She  has agreed to work on weight loss and gradually increase exercise to treat her exercise induced shortness of breath. If Lakoda follows our instructions and loses weight without improvement of her shortness of breath, we will plan to refer to pulmonology. We will order indirect calorimetry, EKG and labs today. We will monitor this condition regularly. Ramiya agrees to this plan.  Hypertension We discussed sodium restriction, working on healthy weight loss, and a regular exercise program as the means to achieve improved blood pressure control. Ziyan agreed with this plan and agreed to follow up as directed. We will continue to monitor her blood pressure as well as her progress with the above lifestyle modifications. She will continue her medications as prescribed and will watch for signs of hypotension as she continues her lifestyle modifications. We will order EKG and labs today.  Cardiovascular risk counseling Cyrstal was given extended (15 minutes) coronary artery disease prevention counseling today. She is 62 y.o. female and has risk factors for heart disease including obesity and hypertension. We discussed intensive lifestyle modifications today with an emphasis on specific weight loss instructions and strategies. Pt was also informed of the importance of increasing exercise and decreasing saturated fats to help prevent heart disease.  Imbalance of nutrition We will check folate, B12, Hgb A1c and insulin level today. Marisel will start to work on diet and weight loss and will follow up with our clinic in 2 weeks.  Depression Screen Earla had a mildly positive depression screening. Depression is commonly associated with obesity and often results in emotional eating behaviors. We will monitor this closely and work on CBT to help improve the non-hunger eating patterns. Referral to Psychology may be required if no  improvement is seen as she continues in our clinic.  Obesity Kaytee is currently in the action stage of change and her goal is to continue with weight loss efforts She has agreed to follow the Category 2 plan +100 calories Huxley has been instructed to work up to a goal of 150 minutes of combined cardio and strengthening exercise per week for weight loss and overall health benefits. We discussed the following Behavioral Modification Strategies today: planning for success, increasing lean protein intake, increasing vegetables and work on meal planning and easy cooking plans  Talaysha has agreed to follow up with our clinic in 2 weeks. She was informed of the importance of frequent follow up visits to maximize her success with intensive lifestyle modifications for her multiple health conditions. She was informed we would discuss her lab results at her next visit unless there is a critical issue that needs to be addressed sooner. Malloree agreed to keep her next visit at the agreed upon time to discuss these results.    OBESITY BEHAVIORAL INTERVENTION VISIT  Today's visit was # 1   Starting weight: 278 lbs Starting date: 01/17/18 Today's weight : 278 lbs  Today's date: 01/17/2018 Total lbs lost to date: 0   ASK: We discussed the diagnosis of obesity with Raoul Pitch today and Maedell agreed to give [place] permission to discuss obesity behavioral modification therapy today.  ASSESS: Lucrezia has the diagnosis of obesity and her BMI today is 44.89 Dollene is in the action stage of change   ADVISE: Shaquanta was educated on the multiple health risks of obesity as well as the benefit of weight loss to improve her health. She was advised of the need for long term treatment and the importance of lifestyle modifications to improve her current health and to  decrease her risk of future health problems.  AGREE: Multiple dietary modification options and treatment options were discussed and  Darnella agreed to follow the  recommendations documented in the above note.  ARRANGE: Emaly was educated on the importance of frequent visits to treat obesity as outlined per CMS and USPSTF guidelines and agreed to schedule her next follow up appointment today.   I, Doreene Nest, am acting as transcriptionist for Eber Jones, MD   I have reviewed the above documentation for accuracy and completeness, and I agree with the above. - Ilene Qua, MD

## 2018-01-18 LAB — VITAMIN D 25 HYDROXY (VIT D DEFICIENCY, FRACTURES): Vit D, 25-Hydroxy: 29.2 ng/mL — ABNORMAL LOW (ref 30.0–100.0)

## 2018-01-18 LAB — CBC WITH DIFFERENTIAL
BASOS: 0 %
Basophils Absolute: 0 10*3/uL (ref 0.0–0.2)
EOS (ABSOLUTE): 0.3 10*3/uL (ref 0.0–0.4)
Eos: 3 %
Hematocrit: 37.4 % (ref 34.0–46.6)
Hemoglobin: 12.1 g/dL (ref 11.1–15.9)
IMMATURE GRANS (ABS): 0 10*3/uL (ref 0.0–0.1)
IMMATURE GRANULOCYTES: 0 %
LYMPHS: 27 %
Lymphocytes Absolute: 2.4 10*3/uL (ref 0.7–3.1)
MCH: 28.9 pg (ref 26.6–33.0)
MCHC: 32.4 g/dL (ref 31.5–35.7)
MCV: 89 fL (ref 79–97)
MONOS ABS: 0.7 10*3/uL (ref 0.1–0.9)
Monocytes: 8 %
NEUTROS PCT: 62 %
Neutrophils Absolute: 5.2 10*3/uL (ref 1.4–7.0)
RBC: 4.19 x10E6/uL (ref 3.77–5.28)
RDW: 13 % (ref 12.3–15.4)
WBC: 8.6 10*3/uL (ref 3.4–10.8)

## 2018-01-18 LAB — COMPREHENSIVE METABOLIC PANEL
ALT: 9 IU/L (ref 0–32)
AST: 17 IU/L (ref 0–40)
Albumin/Globulin Ratio: 1 — ABNORMAL LOW (ref 1.2–2.2)
Albumin: 3.9 g/dL (ref 3.5–5.5)
Alkaline Phosphatase: 82 IU/L (ref 39–117)
BUN/Creatinine Ratio: 17 (ref 9–23)
BUN: 15 mg/dL (ref 6–24)
Bilirubin Total: 0.5 mg/dL (ref 0.0–1.2)
CALCIUM: 9.5 mg/dL (ref 8.7–10.2)
CHLORIDE: 95 mmol/L — AB (ref 96–106)
CO2: 26 mmol/L (ref 20–29)
CREATININE: 0.87 mg/dL (ref 0.57–1.00)
GFR calc Af Amer: 84 mL/min/{1.73_m2} (ref >59)
GFR, EST NON AFRICAN AMERICAN: 73 mL/min/{1.73_m2} (ref >59)
GLUCOSE: 79 mg/dL (ref 65–99)
Globulin, Total: 4 g/dL (ref 1.5–4.5)
POTASSIUM: 4.1 mmol/L (ref 3.5–5.2)
Sodium: 134 mmol/L (ref 134–144)
Total Protein: 7.9 g/dL (ref 6.0–8.5)

## 2018-01-18 LAB — LIPID PANEL WITH LDL/HDL RATIO
Cholesterol, Total: 191 mg/dL (ref 100–199)
HDL: 70 mg/dL (ref >39)
LDL Calculated: 110 mg/dL — ABNORMAL HIGH (ref 0–99)
LDL/HDL RATIO: 1.6 ratio (ref 0.0–3.2)
Triglycerides: 53 mg/dL (ref 0–149)
VLDL CHOLESTEROL CAL: 11 mg/dL (ref 5–40)

## 2018-01-18 LAB — VITAMIN B12: VITAMIN B 12: 643 pg/mL (ref 232–1245)

## 2018-01-18 LAB — HEMOGLOBIN A1C
Est. average glucose Bld gHb Est-mCnc: 120 mg/dL
Hgb A1c MFr Bld: 5.8 % — ABNORMAL HIGH (ref 4.8–5.6)

## 2018-01-18 LAB — FOLATE: Folate: 4.6 ng/mL (ref >3.0)

## 2018-01-18 LAB — T3: T3 TOTAL: 124 ng/dL (ref 71–180)

## 2018-01-18 LAB — INSULIN, RANDOM: INSULIN: 8.4 u[IU]/mL (ref 2.6–24.9)

## 2018-01-18 LAB — TSH: TSH: 1.19 u[IU]/mL (ref 0.450–4.500)

## 2018-01-18 LAB — T4, FREE: Free T4: 1.27 ng/dL (ref 0.82–1.77)

## 2018-01-24 ENCOUNTER — Ambulatory Visit (INDEPENDENT_AMBULATORY_CARE_PROVIDER_SITE_OTHER): Payer: BC Managed Care – PPO | Admitting: Family Medicine

## 2018-01-29 ENCOUNTER — Ambulatory Visit: Payer: BC Managed Care – PPO

## 2018-01-31 ENCOUNTER — Ambulatory Visit (INDEPENDENT_AMBULATORY_CARE_PROVIDER_SITE_OTHER): Payer: BC Managed Care – PPO | Admitting: Family Medicine

## 2018-01-31 VITALS — BP 109/75 | HR 79 | Temp 97.6°F | Ht 66.0 in | Wt 278.0 lb

## 2018-01-31 DIAGNOSIS — R7303 Prediabetes: Secondary | ICD-10-CM

## 2018-01-31 DIAGNOSIS — E7849 Other hyperlipidemia: Secondary | ICD-10-CM

## 2018-01-31 DIAGNOSIS — Z9189 Other specified personal risk factors, not elsewhere classified: Secondary | ICD-10-CM | POA: Diagnosis not present

## 2018-01-31 DIAGNOSIS — E559 Vitamin D deficiency, unspecified: Secondary | ICD-10-CM | POA: Diagnosis not present

## 2018-01-31 DIAGNOSIS — Z6841 Body Mass Index (BMI) 40.0 and over, adult: Secondary | ICD-10-CM

## 2018-01-31 MED ORDER — VITAMIN D (ERGOCALCIFEROL) 1.25 MG (50000 UNIT) PO CAPS: 50000.0000 [IU] | ORAL_CAPSULE | ORAL | 0 refills | Status: DC

## 2018-02-05 NOTE — Progress Notes (Signed)
Office: [phone]  /  Fax: [phone]   HPI:   Chief Complaint: OBESITY Laura [name] is here to discuss her progress with her obesity treatment plan. She is on the  follow the Category 2 plan +100 calories and is following her eating plan approximately 90 % of the time. She states she is exercising 0 minutes 0 times per week. Laura [name] voices disappointment with weight loss of 2 lbs. She denies hunger. She is doing 8 oz of meat at dinner. She noticed her appetite decreased.   Her weight is 278 lb (126.1 kg) today and has had a weight loss of 2 pounds over a period of 2 weeks since her last visit. She has lost 2 lbs since starting treatment with [place].  Vitamin D deficiency Laura [name] has a historical diagnosis of vitamin D deficiency. She is currently taking OTC vit D and denies nausea, vomiting or muscle weakness. She reports fatigue.   Ref. Range 01/17/2018 12:22  Vitamin D, 25-Hydroxy Latest Ref Range: 30.0 - 100.0 ng/mL 29.2 (L)   Hyperlipidemia Laura [name] has hyperlipidemia. She has a LDL of 110 and has been trying to improve her cholesterol levels with intensive lifestyle modification including a low saturated fat diet, exercise and weight loss. She denies any chest pain, claudication or myalgias. She is not currently on medications.   Pre-Diabetes Laura [name] has a new diagnosis of prediabetes based on her elevated HgA1c and was informed this puts her at greater risk of developing diabetes. She is not taking metformin currently and continues to work on diet and exercise to decrease risk of diabetes. She denies nausea or hypoglycemia. She reports carb cravings occasionally.   At risk for diabetes Laura [name] is at higher than averagerisk for developing diabetes due to her obesity. She currently denies polyuria or polydipsia.  ALLERGIES: No Known Allergies  MEDICATIONS: Current Outpatient Medications on File Prior to Visit  Medication Sig Dispense Refill  . lisinopril-hydrochlorothiazide (PRINZIDE,ZESTORETIC) 20-25  MG tablet TAKE 1 TABLET BY MOUTH ONCE DAILY FOR 90 DAYS  1  . rivaroxaban (XARELTO) 10 MG TABS tablet Take 1 tablet (10 mg total) by mouth daily. 90 tablet 3   No current facility-administered medications on file prior to visit.     PAST MEDICAL HISTORY: Past Medical History:  Diagnosis Date  . Anemia, normocytic normochromic 07/04/2017   Chronic Hb 10 +/- 0.5 gms  . Edema   . 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 Herpes   . High blood pressure   . History of blood clots   . Obesity   . Partial tear of Achilles tendon   . Pneumonia   . 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 Pulmonary embolism ([place])   . 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 Vitamin D deficiency     PAST SURGICAL HISTORY: Past Surgical History:  Procedure Laterality Date  . BREAST BIOPSY    . [place]  . 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 Smoking status: Never Smoker  . Smokeless tobacco: Never Used  Substance Use Topics  . Alcohol use: Yes    Alcohol/week: 0.0 standard drinks  Comment: Rarely.  . Drug use: No    FAMILY HISTORY: Family History  Problem Relation Age of Onset  . Hypertension Mother   . Heart failure Mother   . Cancer Mother   . Depression Mother   . Sleep apnea Mother   . Obesity Mother   . Prostate cancer Father   . Multiple myeloma Father   . Heart failure Father   . Obesity Father   . Sleep apnea Father   . Kidney disease Father   . Sudden Cardiac Death Father   . Hypertension Father   . Heart attack Neg Hx   . Hyperlipidemia Neg Hx   . Diabetes Neg Hx   . Sudden death Neg Hx     ROS: Review of Systems  Constitutional:  Positive for malaise/fatigue and weight loss.  Cardiovascular: Negative for chest pain and claudication.  Gastrointestinal: Negative for nausea and vomiting.  Musculoskeletal: Negative for myalgias.       Negative for muscle weakness  Endo/Heme/Allergies: Negative for polydipsia.       Negative for polyuria Negative for hypoglycemia     PHYSICAL EXAM: Blood pressure 109/75, pulse 79, temperature 97.6 F (36.4 C), temperature source Oral, height _0  (1.676 m), weight 278 lb (126.1 kg), SpO2 97 %. Body mass index is 44.87 kg/m. Physical Exam  Constitutional: She is oriented to person, place, and time. She appears well-developed and well-nourished.  HENT:  Head: Normocephalic.  Neck: Normal range of motion.  Cardiovascular: Normal rate.  Pulmonary/Chest: Effort normal.  Musculoskeletal: Normal range of motion.  Neurological: She is alert and oriented to person, place, and time.  Skin: Skin is warm and dry.  Psychiatric: She has a normal mood and affect. Her behavior is normal.  Vitals reviewed.   RECENT LABS AND TESTS: BMET    Component Value Date/Time   NA 134 01/17/2018 1222   K 4.1 01/17/2018 1222   CL 95 (L) 01/17/2018 1222   CO2 26 01/17/2018 1222   GLUCOSE 79 01/17/2018 1222   GLUCOSE 129 (H) 09/07/2016 0437   BUN 15 01/17/2018 1222   CREATININE 0.87 01/17/2018 1222   CREATININE 0.83 05/13/2014 1158   CALCIUM 9.5 01/17/2018 1222   GFRNONAA 73 01/17/2018 1222   GFRAA 84 01/17/2018 1222   Lab Results  Component Value Date   HGBA1C 5.8 (H) 01/17/2018   Lab Results  Component Value Date   INSULIN 8.4 01/17/2018   CBC    Component Value Date/Time   WBC 8.6 01/17/2018 1222   WBC 13.8 (H) 09/08/2016 0409   RBC 4.19 01/17/2018 1222   RBC 3.63 (L) 09/08/2016 0409   HGB 12.1 01/17/2018 1222   HGB 12.8 05/29/2012 1502   HCT 37.4 01/17/2018 1222   HCT 40.0 05/29/2012 1502   PLT 289 07/04/2017 0938   MCV 89 01/17/2018 1222   MCV 90.3 05/29/2012 1502   MCH  28.9 01/17/2018 1222   MCH 28.4 09/08/2016 0409   MCHC 32.4 01/17/2018 1222   MCHC 30.7 09/08/2016 0409   RDW 13.0 01/17/2018 1222   RDW 14.9 (H) 05/29/2012 1502   LYMPHSABS 2.4 01/17/2018 1222   LYMPHSABS 2.9 05/29/2012 1502   MONOABS 0.7 05/13/2014 1158   MONOABS 0.7 05/29/2012 1502   EOSABS 0.3 01/17/2018 1222   BASOSABS 0.0 01/17/2018 1222   BASOSABS 0.0 05/29/2012 1502   Iron/TIBC/Ferritin/ %Sat    Component Value Date/Time   IRON 41 (L) 11/20/2008 1222   TIBC 319 11/20/2008 1222   FERRITIN  29 11/20/2008 1222   IRONPCTSAT 13 (L) 11/20/2008 1222   Lipid Panel     Component Value Date/Time   CHOL 191 01/17/2018 1222   TRIG 53 01/17/2018 1222   HDL 70 01/17/2018 1222   LDLCALC 110 (H) 01/17/2018 1222   Hepatic Function Panel     Component Value Date/Time   PROT 7.9 01/17/2018 1222   ALBUMIN 3.9 01/17/2018 1222   AST 17 01/17/2018 1222   ALT 9 01/17/2018 1222   ALKPHOS 82 01/17/2018 1222   BILITOT 0.5 01/17/2018 1222   BILIDIR 0.1 02/18/2008 1458   IBILI 0.3 02/18/2008 1458      Component Value Date/Time   TSH 1.190 01/17/2018 1222    Ref. Range 01/17/2018 12:22  Vitamin D, 25-Hydroxy Latest Ref Range: 30.0 - 100.0 ng/mL 29.2 (L)    ASSESSMENT AND PLAN: Vitamin D deficiency - Plan: Vitamin D, Ergocalciferol, (DRISDOL) 1.25 MG (50000 UT) CAPS capsule  Other hyperlipidemia  Pre-diabetes  At risk for diabetes mellitus  Class 3 severe obesity with serious comorbidity and body mass index (BMI) of 40.0 to 44.9 in adult, unspecified obesity type ([place])  PLAN: Vitamin D Deficiency Jasminemarie was informed that low vitamin D levels contributes to fatigue and are associated with obesity, breast, and colon cancer. She agrees to start to take prescription Vit D _0 ,000 IU every week #4 with no refills and will follow up for routine testing of vitamin D, at least 2-3 times per year. She was informed of the risk of over-replacement of vitamin D and agrees to not increase her  dose unless she discusses this with [place] first. Agrees to follow up with our clinic as directed.   Hyperlipidemia Ludwika was informed of the American Heart Association Guidelines emphasizing intensive lifestyle modifications as the first line treatment for hyperlipidemia. We discussed many lifestyle modifications today in depth, and Anhthu will continue to work on decreasing saturated fats such as fatty red meat, butter and many fried foods. She will also increase vegetables and lean protein in her diet and continue to work on exercise and weight loss efforts. We will repeat FLP in 3 months.   Pre-Diabetes Trinidad will continue to work on weight loss, exercise, and decreasing simple carbohydrates in her diet to help decrease the risk of diabetes. We dicussed metformin including benefits and risks. She was informed that eating too many simple carbohydrates or too many calories at one sitting increases the likelihood of GI side effects. Tessah declined metformin for now and a prescription was not written today. Jessaca agreed to follow up with [place] as directed to monitor her progress. We will repeat labs mid Feb.   Diabetes risk counseling Margo was given extended (30 minutes) diabetes prevention counseling today. She is 62 y.o. female and has risk factors for diabetes including obesity. We discussed intensive lifestyle modifications today with an emphasis on weight loss as well as increasing exercise and decreasing simple carbohydrates in her diet.  Obesity Breunna is currently in the action stage of change. As such, her goal is to continue with weight loss efforts She has agreed to follow the Category 2 plan +100 calories.  Janat has been instructed to work up to a goal of 150 minutes of combined cardio and strengthening exercise per week for weight loss and overall health benefits. We discussed the following Behavioral Modification Strategies today: increasing lean protein intake, increasing vegetables and work on meal  planning, planning for success, and easy cooking plans.    Dylynn has  agreed to follow up with our clinic in 2 weeks. She was informed of the importance of frequent follow up visits to maximize her success with intensive lifestyle modifications for her multiple health conditions.   OBESITY BEHAVIORAL INTERVENTION VISIT  Today's visit was # 2   Starting weight: 278 lb Starting date: 01/17/18 Today's weight : Weight: 278 lb (126.1 kg)  Today's date: 01/31/18 Total lbs lost to date: 2 lb    ASK: We discussed the diagnosis of obesity with Raoul Pitch today and Dotsie agreed to give [place] permission to discuss obesity behavioral modification therapy today.  ASSESS: Clydia has the diagnosis of obesity and her BMI today is 44.89 Jadore is in the action stage of change   ADVISE: Loraine was educated on the multiple health risks of obesity as well as the benefit of weight loss to improve her health. She was advised of the need for long term treatment and the importance of lifestyle modifications to improve her current health and to decrease her risk of future health problems.  AGREE: Multiple dietary modification options and treatment options were discussed and  Orah agreed to follow the recommendations documented in the above note.  ARRANGE: Vyla was educated on the importance of frequent visits to treat obesity as outlined per CMS and USPSTF guidelines and agreed to schedule her next follow up appointment today.  I, Renee Ramus, am acting as transcriptionist for Ilene Qua, MD   I have reviewed the above documentation for accuracy and completeness, and I agree with the above. - Ilene Qua, MD

## 2018-02-19 ENCOUNTER — Ambulatory Visit (INDEPENDENT_AMBULATORY_CARE_PROVIDER_SITE_OTHER): Payer: BC Managed Care – PPO | Admitting: Family Medicine

## 2018-02-26 ENCOUNTER — Ambulatory Visit (INDEPENDENT_AMBULATORY_CARE_PROVIDER_SITE_OTHER): Payer: BC Managed Care – PPO | Admitting: Family Medicine

## 2018-02-26 ENCOUNTER — Encounter (INDEPENDENT_AMBULATORY_CARE_PROVIDER_SITE_OTHER): Payer: Self-pay | Admitting: Family Medicine

## 2018-02-26 VITALS — BP 143/77 | HR 85 | Temp 97.9°F | Ht 66.0 in | Wt 274.0 lb

## 2018-02-26 DIAGNOSIS — Z6841 Body Mass Index (BMI) 40.0 and over, adult: Secondary | ICD-10-CM | POA: Diagnosis not present

## 2018-02-26 DIAGNOSIS — R7303 Prediabetes: Secondary | ICD-10-CM

## 2018-02-26 NOTE — Progress Notes (Signed)
Office: [phone]  /  Fax: [phone]   HPI:   Chief Complaint: OBESITY Laura [name] is here to discuss her progress with her obesity treatment plan. She is on the Category 2 plan + 100 calories and is following her eating plan approximately 70-80% of the time. She states she is exercising 0 minutes 0 times per week. Laura [name] is eating all of the food except bread.  Her weight is 274 lb (124.3 kg) today and has had a weight loss of 4 pounds over a period of 3 to 4 weeks since her last visit. She has lost 4 lbs since starting treatment with [place].  Pre-Diabetes Laura [name] has a diagnosis of pre-diabetes based on her elevated Hgb A1c and was informed this puts her at greater risk of developing diabetes. She is not taking metformin currently and continues to work on diet and exercise to decrease risk of diabetes. She denies polyphagia or hypoglycemia.  ALLERGIES: No Known Allergies  MEDICATIONS: Current Outpatient Medications on File Prior to Visit  Medication Sig Dispense Refill  . lisinopril-hydrochlorothiazide (PRINZIDE,ZESTORETIC) 20-25 MG tablet TAKE 1 TABLET BY MOUTH ONCE DAILY FOR 90 DAYS  1  . rivaroxaban (XARELTO) 10 MG TABS tablet Take 1 tablet (10 mg total) by mouth daily. 90 tablet 3  . Vitamin D, Ergocalciferol, (DRISDOL) 1.25 MG (50000 UT) CAPS capsule Take 1 capsule (50,000 Units total) by mouth every 7 (seven) days. 4 capsule 0   No current facility-administered medications on file prior to visit.     PAST MEDICAL HISTORY: Past Medical History:  Diagnosis Date  . Anemia, normocytic normochromic 07/04/2017   Chronic Hb 10 +/- 0.5 gms  . Edema   . 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 Herpes   . High blood pressure   . History of blood clots   . Obesity   . Partial tear of Achilles tendon   . Pneumonia   . 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 Pulmonary embolism ([place])   . 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 Vitamin D deficiency     PAST SURGICAL HISTORY: Past Surgical History:  Procedure Laterality Date  . BREAST BIOPSY    . [place]  . 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 Smoking status: Never Smoker  . Smokeless tobacco: Never Used  Substance Use Topics  . Alcohol use: Yes    Alcohol/week: 0.0 standard drinks    Comment: Rarely.  . Drug use: No    FAMILY HISTORY: Family History  Problem Relation Age of Onset  . Hypertension Mother   . Heart failure Mother   . Cancer Mother   . Depression Mother   . Sleep apnea Mother   . Obesity Mother   . Prostate cancer Father   . Multiple myeloma Father   . Heart failure Father   . Obesity Father   . Sleep apnea Father   . Kidney disease Father   . Sudden Cardiac Death Father   . Hypertension Father   . Heart attack Neg Hx   . Hyperlipidemia Neg Hx   . Diabetes Neg Hx   .  Sudden death Neg Hx     ROS: Review of Systems  Constitutional: Positive for weight loss.  Endo/Heme/Allergies:       Negative polyphagia Negative hypoglycemia    PHYSICAL EXAM: Blood pressure (!) 143/77, pulse 85, temperature 97.9 F (36.6 C), temperature source Oral, height 5' 6"  (1.676 m), weight 274 lb (124.3 kg), SpO2 98 %. Body mass index is 44.22 kg/m. Physical Exam Vitals signs reviewed.  Constitutional:      Appearance: Normal appearance. She is obese.  Cardiovascular:     Rate and Rhythm: Normal rate.  Pulmonary:     Effort: Pulmonary effort is normal.  Musculoskeletal: Normal range of motion.  Skin:    General: Skin is warm and dry.  Neurological:     Mental Status: She is alert and oriented to person, place, and time.  Psychiatric:        Mood and  Affect: Mood normal.        Behavior: Behavior normal.     RECENT LABS AND TESTS: BMET    Component Value Date/Time   NA 134 01/17/2018 1222   K 4.1 01/17/2018 1222   CL 95 (L) 01/17/2018 1222   CO2 26 01/17/2018 1222   GLUCOSE 79 01/17/2018 1222   GLUCOSE 129 (H) 09/07/2016 0437   BUN 15 01/17/2018 1222   CREATININE 0.87 01/17/2018 1222   CREATININE 0.83 05/13/2014 1158   CALCIUM 9.5 01/17/2018 1222   GFRNONAA 73 01/17/2018 1222   GFRAA 84 01/17/2018 1222   Lab Results  Component Value Date   HGBA1C 5.8 (H) 01/17/2018   Lab Results  Component Value Date   INSULIN 8.4 01/17/2018   CBC    Component Value Date/Time   WBC 8.6 01/17/2018 1222   WBC 13.8 (H) 09/08/2016 0409   RBC 4.19 01/17/2018 1222   RBC 3.63 (L) 09/08/2016 0409   HGB 12.1 01/17/2018 1222   HGB 12.8 05/29/2012 1502   HCT 37.4 01/17/2018 1222   HCT 40.0 05/29/2012 1502   PLT 289 07/04/2017 0938   MCV 89 01/17/2018 1222   MCV 90.3 05/29/2012 1502   MCH 28.9 01/17/2018 1222   MCH 28.4 09/08/2016 0409   MCHC 32.4 01/17/2018 1222   MCHC 30.7 09/08/2016 0409   RDW 13.0 01/17/2018 1222   RDW 14.9 (H) 05/29/2012 1502   LYMPHSABS 2.4 01/17/2018 1222   LYMPHSABS 2.9 05/29/2012 1502   MONOABS 0.7 05/13/2014 1158   MONOABS 0.7 05/29/2012 1502   EOSABS 0.3 01/17/2018 1222   BASOSABS 0.0 01/17/2018 1222   BASOSABS 0.0 05/29/2012 1502   Iron/TIBC/Ferritin/ %Sat    Component Value Date/Time   IRON 41 (L) 11/20/2008 1222   TIBC 319 11/20/2008 1222   FERRITIN 29 11/20/2008 1222   IRONPCTSAT 13 (L) 11/20/2008 1222   Lipid Panel     Component Value Date/Time   CHOL 191 01/17/2018 1222   TRIG 53 01/17/2018 1222   HDL 70 01/17/2018 1222   LDLCALC 110 (H) 01/17/2018 1222   Hepatic Function Panel     Component Value Date/Time   PROT 7.9 01/17/2018 1222   ALBUMIN 3.9 01/17/2018 1222   AST 17 01/17/2018 1222   ALT 9 01/17/2018 1222   ALKPHOS 82 01/17/2018 1222   BILITOT 0.5 01/17/2018 1222    BILIDIR 0.1 02/18/2008 1458   IBILI 0.3 02/18/2008 1458      Component Value Date/Time   TSH 1.190 01/17/2018 1222    ASSESSMENT AND PLAN: Prediabetes  Class 3 severe obesity with  serious comorbidity and body mass index (BMI) of 40.0 to 44.9 in adult, unspecified obesity type [facility])  PLAN:  Pre-Diabetes Laura [name] will continue her meal plan, and will continue to work on weight loss, exercise, and decreasing simple carbohydrates in her diet to help decrease the risk of diabetes. We dicussed metformin including benefits and risks. She was informed that eating too many simple carbohydrates or too many calories at one sitting increases the likelihood of GI side effects. Argie declined metformin for now and a prescription was not written today. Laura [name] agrees to follow up with our clinic in 2 weeks as directed to monitor her progress.  I spent > than 50% of the 15 minute visit on counseling as documented in the note.  Obesity Laura [name] is currently in the action stage of change. As such, her goal is to continue with weight loss efforts She has agreed to follow the Category 2 plan + 100 calories Laura [name] has been instructed to work up to a goal of 150 minutes of combined cardio and strengthening exercise per week for weight loss and overall health benefits. We discussed the following Behavioral Modification Strategies today: holiday eating strategies and planning for success I encouraged Laura [name] to eat all of the food on the plan.  Laura [name] has agreed to follow up with our clinic in 2 weeks. She was informed of the importance of frequent follow up visits to maximize her success with intensive lifestyle modifications for her multiple health conditions.   OBESITY BEHAVIORAL INTERVENTION VISIT  Today's visit was # 3  Starting weight: 278 lbs Starting date: 01/17/18 Today's weight : 274 lbs Today's date: 02/26/2018 Total lbs lost to date: 4    ASK: We discussed the diagnosis of obesity with Laura [name] today  and Laura [name] agreed to give [place] permission to discuss obesity behavioral modification therapy today.  ASSESS: Laura [name] has the diagnosis of obesity and her BMI today is 44.25 Laura [name] is in the action stage of change   ADVISE: Laura [name] was educated on the multiple health risks of obesity as well as the benefit of weight loss to improve her health. She was advised of the need for long term treatment and the importance of lifestyle modifications to improve her current health and to decrease her risk of future health problems.  AGREE: Multiple dietary modification options and treatment options were discussed and  Laura [name] agreed to follow the recommendations documented in the above note.  ARRANGE: Laura [name] was educated on the importance of frequent visits to treat obesity as outlined per CMS and USPSTF guidelines and agreed to schedule her next follow up appointment today.  Wilhemena Durie, am acting as Location manager for [employer], FNP-C.  I have reviewed the above documentation for accuracy and completeness, and I agree with the above.  - Beautiful [place], FNP-C.

## 2018-02-27 ENCOUNTER — Encounter (INDEPENDENT_AMBULATORY_CARE_PROVIDER_SITE_OTHER): Payer: Self-pay | Admitting: Family Medicine

## 2018-03-08 ENCOUNTER — Ambulatory Visit
Admission: RE | Admit: 2018-03-08 | Discharge: 2018-03-08 | Disposition: A | Discharge status: Home / Self Care | Payer: BC Managed Care – PPO | Source: Ambulatory Visit | Attending: Family Medicine | Admitting: Family Medicine

## 2018-03-08 DIAGNOSIS — Z1231 Encounter for screening mammogram for malignant neoplasm of breast: Secondary | ICD-10-CM

## 2018-03-13 ENCOUNTER — Other Ambulatory Visit: Payer: Self-pay | Admitting: Oncology

## 2018-03-13 DIAGNOSIS — D649 Anemia, unspecified: Secondary | ICD-10-CM

## 2018-03-13 DIAGNOSIS — Z7901 Long term (current) use of anticoagulants: Secondary | ICD-10-CM

## 2018-03-13 DIAGNOSIS — D689 Coagulation defect, unspecified: Secondary | ICD-10-CM

## 2018-03-13 DIAGNOSIS — I2782 Chronic pulmonary embolism: Secondary | ICD-10-CM

## 2018-03-19 ENCOUNTER — Ambulatory Visit (INDEPENDENT_AMBULATORY_CARE_PROVIDER_SITE_OTHER): Payer: BC Managed Care – PPO | Admitting: Family Medicine

## 2018-03-19 ENCOUNTER — Encounter (INDEPENDENT_AMBULATORY_CARE_PROVIDER_SITE_OTHER): Payer: Self-pay | Admitting: Family Medicine

## 2018-03-19 VITALS — BP 132/84 | HR 90 | Temp 97.7°F | Ht 66.0 in | Wt 276.0 lb

## 2018-03-19 DIAGNOSIS — E559 Vitamin D deficiency, unspecified: Secondary | ICD-10-CM

## 2018-03-19 DIAGNOSIS — Z9189 Other specified personal risk factors, not elsewhere classified: Secondary | ICD-10-CM

## 2018-03-19 DIAGNOSIS — I10 Essential (primary) hypertension: Secondary | ICD-10-CM

## 2018-03-19 DIAGNOSIS — Z6841 Body Mass Index (BMI) 40.0 and over, adult: Secondary | ICD-10-CM

## 2018-03-19 MED ORDER — VITAMIN D (ERGOCALCIFEROL) 1.25 MG (50000 UNIT) PO CAPS: 50000.0000 [IU] | ORAL_CAPSULE | ORAL | 0 refills | Status: DC

## 2018-03-20 NOTE — Progress Notes (Signed)
Office: [phone]  /  Fax: [phone]   HPI:   Chief Complaint: OBESITY Laura [name] is here to discuss her progress with her obesity treatment plan. She is on the Category 2 plan + 100 calories and is following her eating plan approximately 0 % of the time. She states she is exercising 0 minutes 0 times per week. Laura [name] was off work for 14 days and struggled with structure and indulged quite a bit. She got back on track today.  Her weight is 276 lb (125.2 kg) today and has gained 2 pounds since her last visit. She has lost 2 lbs since starting treatment with [place].  Vitamin D Deficiency Laura [name] has a diagnosis of vitamin D deficiency. She iscurrently taking prescription Vit D. She note fatigue and denies nausea, vomiting or muscle weakness.  At risk for osteopenia and osteoporosis Laura [name] is at higher risk of osteopenia and osteoporosis due to vitamin D deficiency.   Hypertension Laura [name] is a 63 y.o. female with hypertension. Laura [name]'s blood pressure is controlled today. She denies chest pain, chest pressure, or headaches. She is working weight loss to help control her blood pressure with the goal of decreasing her risk of heart attack and stroke.   ASSESSMENT AND PLAN:  Vitamin D deficiency - Plan: Vitamin D, Ergocalciferol, (DRISDOL) 1.25 MG (50000 UT) CAPS capsule  Essential hypertension  At high risk for osteoporosis  Class 3 severe obesity with serious comorbidity and body mass index (BMI) of 40.0 to 44.9 in adult, unspecified obesity type ([place])  PLAN:  Vitamin D Deficiency Laura [name] was informed that low vitamin D levels contributes to fatigue and are associated with obesity, breast, and colon cancer. Laura [name] agrees to continue taking prescription Vit D @50 ,000 IU every week #4 and we will refill for 1 month. She will follow up for routine testing of vitamin D, at least 2-3 times per year. She was informed of the risk of over-replacement of vitamin D and agrees to not increase her dose unless  she discusses this with [place] first. Laura [name] agrees to follow up with our clinic in 2 weeks.  At risk for osteopenia and osteoporosis Laura [name] was given extended (15 minutes) osteoporosis prevention counseling today. Laura [name] is at risk for osteopenia and osteoporsis due to her vitamin D deficiency. She was encouraged to take her vitamin D and follow her higher calcium diet and increase strengthening exercise to help strengthen her bones and decrease her risk of osteopenia and osteoporosis.  Hypertension We discussed sodium restriction, working on healthy weight loss, and a regular exercise program as the means to achieve improved blood pressure control. Laura [name] agreed with this plan and agreed to follow up as directed. We will continue to monitor her blood pressure as well as her progress with the above lifestyle modifications. Laura [name] agrees to continue taking Prinzide and will watch for signs of hypotension as she continues her lifestyle modifications. Laura [name] agrees to follow up with our clinic in 2 weeks.  Obesity Laura [name] is currently in the action stage of change. As such, her goal is to continue with weight loss efforts She has agreed to follow the Category 2 plan + 100 calories Laura [name] has been instructed to work up to a goal of 150 minutes of combined cardio and strengthening exercise per week or start riding exercise bike for 30 minutes 3 times per week for weight loss and overall health benefits. We discussed the following Behavioral Modification Strategies today: increasing lean protein intake, increasing vegetables, work on meal planning  and easy cooking plans, and planning for success   Laura [name] has agreed to follow up with our clinic in 2 weeks. She was informed of the importance of frequent follow up visits to maximize her success with intensive lifestyle modifications for her multiple health conditions.  ALLERGIES: No Known Allergies  MEDICATIONS: Current Outpatient Medications on File Prior to Visit    Medication Sig Dispense Refill  . lisinopril-hydrochlorothiazide (PRINZIDE,ZESTORETIC) 20-25 MG tablet TAKE 1 TABLET BY MOUTH ONCE DAILY FOR 90 DAYS  1  . rivaroxaban (XARELTO) 10 MG TABS tablet Take 1 tablet (10 mg total) by mouth daily. 90 tablet 3   No current facility-administered medications on file prior to visit.     PAST MEDICAL HISTORY: Past Medical History:  Diagnosis Date  . Anemia, normocytic normochromic 07/04/2017   Chronic Hb 10 +/- 0.5 gms  . Edema   . 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 Herpes   . High blood pressure   . History of blood clots   . Obesity   . Partial tear of Achilles tendon   . Pneumonia   . 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 Pulmonary embolism ([place])   . 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 Vitamin D deficiency     PAST SURGICAL HISTORY: Past Surgical History:  Procedure Laterality Date  . BREAST BIOPSY    . [place]  . 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 Smoking status: Never Smoker  . Smokeless tobacco: Never Used  Substance Use Topics  . Alcohol use: Yes    Alcohol/week: 0.0 standard drinks    Comment: Rarely.  . Drug use: No    FAMILY HISTORY: Family History  Problem Relation Age of Onset  . Hypertension Mother   . Heart failure Mother   . Cancer Mother   . Depression Mother   . Sleep apnea Mother   . Obesity Mother   . Prostate cancer Father   . Multiple myeloma Father   . Heart failure Father   . Obesity Father   . Sleep apnea Father   . Kidney disease Father   . Sudden Cardiac Death Father   . Hypertension Father   . Heart attack Neg Hx   . Hyperlipidemia Neg Hx    . Diabetes Neg Hx   . Sudden death Neg Hx   . Breast cancer Neg Hx     ROS: Review of Systems  Constitutional: Positive for malaise/fatigue. Negative for weight loss.  Cardiovascular: Negative for chest pain.       Negative chest pressure  Gastrointestinal: Negative for nausea and vomiting.  Musculoskeletal:       Negative muscle weakness  Neurological: Negative for headaches.    PHYSICAL EXAM: Blood pressure 132/84, pulse 90, temperature 97.7 F (36.5 C), temperature source Oral, height 5' 6"  (1.676 m), weight 276 lb (125.2 kg), SpO2 92 %. Body mass index is 44.55 kg/m. Physical Exam Vitals signs reviewed.  Constitutional:      Appearance: Normal appearance. She is obese.  Cardiovascular:     Rate and Rhythm: Normal rate.     Pulses: Normal  pulses.  Pulmonary:     Effort: Pulmonary effort is normal.  Musculoskeletal: Normal range of motion.  Skin:    General: Skin is warm and dry.  Neurological:     Mental Status: She is alert and oriented to person, place, and time.  Psychiatric:        Mood and Affect: Mood normal.        Behavior: Behavior normal.     RECENT LABS AND TESTS: BMET    Component Value Date/Time   NA 134 01/17/2018 1222   K 4.1 01/17/2018 1222   CL 95 (L) 01/17/2018 1222   CO2 26 01/17/2018 1222   GLUCOSE 79 01/17/2018 1222   GLUCOSE 129 (H) 09/07/2016 0437   BUN 15 01/17/2018 1222   CREATININE 0.87 01/17/2018 1222   CREATININE 0.83 05/13/2014 1158   CALCIUM 9.5 01/17/2018 1222   GFRNONAA 73 01/17/2018 1222   GFRAA 84 01/17/2018 1222   Lab Results  Component Value Date   HGBA1C 5.8 (H) 01/17/2018   Lab Results  Component Value Date   INSULIN 8.4 01/17/2018   CBC    Component Value Date/Time   WBC 8.6 01/17/2018 1222   WBC 13.8 (H) 09/08/2016 0409   RBC 4.19 01/17/2018 1222   RBC 3.63 (L) 09/08/2016 0409   HGB 12.1 01/17/2018 1222   HGB 12.8 05/29/2012 1502   HCT 37.4 01/17/2018 1222   HCT 40.0 05/29/2012 1502   PLT 289  07/04/2017 0938   MCV 89 01/17/2018 1222   MCV 90.3 05/29/2012 1502   MCH 28.9 01/17/2018 1222   MCH 28.4 09/08/2016 0409   MCHC 32.4 01/17/2018 1222   MCHC 30.7 09/08/2016 0409   RDW 13.0 01/17/2018 1222   RDW 14.9 (H) 05/29/2012 1502   LYMPHSABS 2.4 01/17/2018 1222   LYMPHSABS 2.9 05/29/2012 1502   MONOABS 0.7 05/13/2014 1158   MONOABS 0.7 05/29/2012 1502   EOSABS 0.3 01/17/2018 1222   BASOSABS 0.0 01/17/2018 1222   BASOSABS 0.0 05/29/2012 1502   Iron/TIBC/Ferritin/ %Sat    Component Value Date/Time   IRON 41 (L) 11/20/2008 1222   TIBC 319 11/20/2008 1222   FERRITIN 29 11/20/2008 1222   IRONPCTSAT 13 (L) 11/20/2008 1222   Lipid Panel     Component Value Date/Time   CHOL 191 01/17/2018 1222   TRIG 53 01/17/2018 1222   HDL 70 01/17/2018 1222   LDLCALC 110 (H) 01/17/2018 1222   Hepatic Function Panel     Component Value Date/Time   PROT 7.9 01/17/2018 1222   ALBUMIN 3.9 01/17/2018 1222   AST 17 01/17/2018 1222   ALT 9 01/17/2018 1222   ALKPHOS 82 01/17/2018 1222   BILITOT 0.5 01/17/2018 1222   BILIDIR 0.1 02/18/2008 1458   IBILI 0.3 02/18/2008 1458      Component Value Date/Time   TSH 1.190 01/17/2018 1222      OBESITY BEHAVIORAL INTERVENTION VISIT  Today's visit was # 4   Starting weight: 278 lbs Starting date: 01/17/18 Today's weight : 276 lbs  Today's date: 03/19/2018 Total lbs lost to date: 2    ASK: We discussed the diagnosis of obesity with Laura [name] today and Laura [name] agreed to give [place] permission to discuss obesity behavioral modification therapy today.  ASSESS: Laura [name] has the diagnosis of obesity and her BMI today is 44.57 Laura [name] is in the action stage of change   ADVISE: Laura [name] was educated on the multiple health risks of obesity as well as the benefit of weight loss  to improve her health. She was advised of the need for long term treatment and the importance of lifestyle modifications to improve her current health and to decrease her risk of  future health problems.  AGREE: Multiple dietary modification options and treatment options were discussed and  Laura [name] agreed to follow the recommendations documented in the above note.  ARRANGE: Laura [name] was educated on the importance of frequent visits to treat obesity as outlined per CMS and USPSTF guidelines and agreed to schedule her next follow up appointment today.  I, Trixie Dredge, am acting as transcriptionist for Ilene Qua, MD  I have reviewed the above documentation for accuracy and completeness, and I agree with the above. - Ilene Qua, MD

## 2018-04-04 ENCOUNTER — Ambulatory Visit (INDEPENDENT_AMBULATORY_CARE_PROVIDER_SITE_OTHER): Payer: BC Managed Care – PPO | Admitting: Family Medicine

## 2018-04-09 ENCOUNTER — Ambulatory Visit (INDEPENDENT_AMBULATORY_CARE_PROVIDER_SITE_OTHER): Payer: BC Managed Care – PPO | Admitting: Family Medicine

## 2018-04-09 ENCOUNTER — Encounter (INDEPENDENT_AMBULATORY_CARE_PROVIDER_SITE_OTHER): Payer: Self-pay | Admitting: Family Medicine

## 2018-04-09 VITALS — BP 113/74 | HR 87 | Temp 97.5°F | Ht 66.0 in | Wt 274.0 lb

## 2018-04-09 DIAGNOSIS — E559 Vitamin D deficiency, unspecified: Secondary | ICD-10-CM | POA: Diagnosis not present

## 2018-04-09 DIAGNOSIS — I10 Essential (primary) hypertension: Secondary | ICD-10-CM

## 2018-04-09 DIAGNOSIS — Z6841 Body Mass Index (BMI) 40.0 and over, adult: Secondary | ICD-10-CM | POA: Diagnosis not present

## 2018-04-10 NOTE — Progress Notes (Signed)
Office: [phone]  /  Fax: [phone]   HPI:   Chief Complaint: OBESITY Laura [name] is here to discuss her progress with her obesity treatment plan. She is on the Category 2 plan + 100 calories and is following her eating plan approximately 50 % of the time. She states she is bike riding for 30 minutes 2 times per week. Laura [name] had to put her dog down a week and a half ago and then didn't follow the plan much; emotional non eater.  Her weight is 274 lb (124.3 kg) today and has had a weight loss of 2 pounds over a period of 3 weeks since her last visit. She has lost 4 lbs since starting treatment with [place].  Vitamin D Deficiency Laura [name] has a diagnosis of vitamin D deficiency. She is currently taking prescription Vit D. She notes fatigue and denies nausea, vomiting or muscle weakness.  Hypertension Laura [name] is a 63 y.o. female with hypertension. Laura [name]'s blood pressure is controlled today. She denies chest pain, chest pressure, or headaches. She is working on weight loss to help control her blood pressure with the goal of decreasing her risk of heart attack and stroke.  ASSESSMENT AND PLAN:  Vitamin D deficiency  Essential hypertension  Class 3 severe obesity with serious comorbidity and body mass index (BMI) of 40.0 to 44.9 in adult, unspecified obesity type ([place])  PLAN:  Vitamin D Deficiency Laura [name] was informed that low vitamin D levels contributes to fatigue and are associated with obesity, breast, and colon cancer. Diandra agrees to continue taking prescription Vit D @50 ,000 IU every week, no refill needed. She will follow up for routine testing of vitamin D, at least 2-3 times per year. She was informed of the risk of over-replacement of vitamin D and agrees to not increase her dose unless she discusses this with [place] first. Laura [name] agrees to follow up with our clinic in 2 weeks.  Hypertension We discussed sodium restriction, working on healthy weight loss, and a regular exercise program as the  means to achieve improved blood pressure control. Laura [name] agreed with this plan and agreed to follow up as directed. We will continue to monitor her blood pressure as well as her progress with the above lifestyle modifications. Laura [name] will continue her medications and will watch for signs of hypotension as she continues her lifestyle modifications. We will follow up on blood pressure at next appointment. Laura [name] agrees to follow up with our clinic in 2 weeks.  I spent > than 50% of the 15 minute visit on counseling as documented in the note.  Obesity Laura [name] is currently in the action stage of change. As such, her goal is to continue with weight loss efforts She has agreed to keep a food journal with 250-350 calories and 25+ grams of protein at breakfast daily and follow the Category 2 plan + 100 calories Laura [name] has been instructed to work up to a goal of 150 minutes of combined cardio and strengthening exercise per week or add resistance training for 15-30 minutes 1-2 times per week for weight loss and overall health benefits. We discussed the following Behavioral Modification Strategies today: increasing lean protein intake, increasing vegetables, work on meal planning and easy cooking plans, and planning for success   Laura [name] has agreed to follow up with our clinic in 2 weeks. She was informed of the importance of frequent follow up visits to maximize her success with intensive lifestyle modifications for her multiple health conditions.  ALLERGIES: No Known Allergies  MEDICATIONS: Current Outpatient Medications on File Prior to Visit  Medication Sig Dispense Refill  . lisinopril-hydrochlorothiazide (PRINZIDE,ZESTORETIC) 20-25 MG tablet TAKE 1 TABLET BY MOUTH ONCE DAILY FOR 90 DAYS  1  . rivaroxaban (XARELTO) 10 MG TABS tablet Take 1 tablet (10 mg total) by mouth daily. 90 tablet 3  . Vitamin D, Ergocalciferol, (DRISDOL) 1.25 MG (50000 UT) CAPS capsule Take 1 capsule (50,000 Units total) by mouth every 7  (seven) days. 4 capsule 0   No current facility-administered medications on file prior to visit.     PAST MEDICAL HISTORY: Past Medical History:  Diagnosis Date  . Anemia, normocytic normochromic 07/04/2017   Chronic Hb 10 +/- 0.5 gms  . Edema   . 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 Herpes   . High blood pressure   . History of blood clots   . Obesity   . Partial tear of Achilles tendon   . Pneumonia   . 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 Pulmonary embolism ([place])   . 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 Vitamin D deficiency     PAST SURGICAL HISTORY: Past Surgical History:  Procedure Laterality Date  . BREAST BIOPSY    . [place]  . 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 Smoking status: Never Smoker  . Smokeless tobacco: Never Used  Substance Use Topics  . Alcohol use: Yes    Alcohol/week: 0.0 standard drinks    Comment: Rarely.  . Drug use: No    FAMILY HISTORY: Family History  Problem Relation Age of Onset  . Hypertension Mother   . Heart failure Mother   . Cancer Mother   . Depression Mother   . Sleep apnea Mother   . Obesity Mother   . Prostate cancer Father   . Multiple myeloma Father   . Heart failure Father   . Obesity Father   . Sleep apnea Father   . Kidney disease Father   . Sudden Cardiac Death Father   . Hypertension Father   . Heart attack Neg Hx   . Hyperlipidemia Neg Hx   . Diabetes Neg Hx   . Sudden death Neg Hx   . Breast cancer Neg Hx     ROS: Review of Systems  Constitutional: Positive for malaise/fatigue and weight loss.  Cardiovascular: Negative for chest pain.       Negative  chest pressure  Gastrointestinal: Negative for nausea and vomiting.  Musculoskeletal:       Negative muscle weakness  Neurological: Negative for headaches.    PHYSICAL EXAM: Blood pressure 113/74, pulse 87, temperature (!) 97.5 F (36.4 C), temperature source Oral, height 5' 6"  (1.676 m), weight 274 lb (124.3 kg), SpO2 98 %. Body mass index is 44.22 kg/m. Physical Exam Vitals signs reviewed.  Constitutional:      Appearance: Normal appearance. She is obese.  Cardiovascular:     Rate and Rhythm: Normal rate.     Pulses: Normal pulses.  Pulmonary:     Effort: Pulmonary effort is normal.     Breath sounds: Normal breath sounds.  Musculoskeletal: Normal range of  motion.  Skin:    General: Skin is warm and dry.  Neurological:     Mental Status: She is alert and oriented to person, place, and time.  Psychiatric:        Mood and Affect: Mood normal.        Behavior: Behavior normal.     RECENT LABS AND TESTS: BMET    Component Value Date/Time   NA 134 01/17/2018 1222   K 4.1 01/17/2018 1222   CL 95 (L) 01/17/2018 1222   CO2 26 01/17/2018 1222   GLUCOSE 79 01/17/2018 1222   GLUCOSE 129 (H) 09/07/2016 0437   BUN 15 01/17/2018 1222   CREATININE 0.87 01/17/2018 1222   CREATININE 0.83 05/13/2014 1158   CALCIUM 9.5 01/17/2018 1222   GFRNONAA 73 01/17/2018 1222   GFRAA 84 01/17/2018 1222   Lab Results  Component Value Date   HGBA1C 5.8 (H) 01/17/2018   Lab Results  Component Value Date   INSULIN 8.4 01/17/2018   CBC    Component Value Date/Time   WBC 8.6 01/17/2018 1222   WBC 13.8 (H) 09/08/2016 0409   RBC 4.19 01/17/2018 1222   RBC 3.63 (L) 09/08/2016 0409   HGB 12.1 01/17/2018 1222   HGB 12.8 05/29/2012 1502   HCT 37.4 01/17/2018 1222   HCT 40.0 05/29/2012 1502   PLT 289 07/04/2017 0938   MCV 89 01/17/2018 1222   MCV 90.3 05/29/2012 1502   MCH 28.9 01/17/2018 1222   MCH 28.4 09/08/2016 0409   MCHC 32.4 01/17/2018 1222   MCHC 30.7 09/08/2016 0409   RDW  13.0 01/17/2018 1222   RDW 14.9 (H) 05/29/2012 1502   LYMPHSABS 2.4 01/17/2018 1222   LYMPHSABS 2.9 05/29/2012 1502   MONOABS 0.7 05/13/2014 1158   MONOABS 0.7 05/29/2012 1502   EOSABS 0.3 01/17/2018 1222   BASOSABS 0.0 01/17/2018 1222   BASOSABS 0.0 05/29/2012 1502   Iron/TIBC/Ferritin/ %Sat    Component Value Date/Time   IRON 41 (L) 11/20/2008 1222   TIBC 319 11/20/2008 1222   FERRITIN 29 11/20/2008 1222   IRONPCTSAT 13 (L) 11/20/2008 1222   Lipid Panel     Component Value Date/Time   CHOL 191 01/17/2018 1222   TRIG 53 01/17/2018 1222   HDL 70 01/17/2018 1222   LDLCALC 110 (H) 01/17/2018 1222   Hepatic Function Panel     Component Value Date/Time   PROT 7.9 01/17/2018 1222   ALBUMIN 3.9 01/17/2018 1222   AST 17 01/17/2018 1222   ALT 9 01/17/2018 1222   ALKPHOS 82 01/17/2018 1222   BILITOT 0.5 01/17/2018 1222   BILIDIR 0.1 02/18/2008 1458   IBILI 0.3 02/18/2008 1458      Component Value Date/Time   TSH 1.190 01/17/2018 1222      OBESITY BEHAVIORAL INTERVENTION VISIT  Today's visit was # 5   Starting weight: 278 lbs Starting date: 01/17/18 Today's weight : 274 lbs  Today's date: 04/09/2018 Total lbs lost to date: 4    ASK: We discussed the diagnosis of obesity with Raoul Pitch today and Cece agreed to give [place] permission to discuss obesity behavioral modification therapy today.  ASSESS: Lilybeth has the diagnosis of obesity and her BMI today is 44.25 Gavriella is in the action stage of change   ADVISE: Ladaysha was educated on the multiple health risks of obesity as well as the benefit of weight loss to improve her health. She was advised of the need for long term treatment and the importance  of lifestyle modifications to improve her current health and to decrease her risk of future health problems.  AGREE: Multiple dietary modification options and treatment options were discussed and  Zamirah agreed to follow the recommendations documented in the above  note.  ARRANGE: Lamona was educated on the importance of frequent visits to treat obesity as outlined per CMS and USPSTF guidelines and agreed to schedule her next follow up appointment today.  I, Trixie Dredge, am acting as transcriptionist for Ilene Qua, MD  I have reviewed the above documentation for accuracy and completeness, and I agree with the above. - Ilene Qua, MD

## 2018-04-12 ENCOUNTER — Encounter: Payer: Self-pay | Admitting: Family Medicine

## 2018-04-12 ENCOUNTER — Ambulatory Visit: Payer: BC Managed Care – PPO | Admitting: Family Medicine

## 2018-04-12 VITALS — BP 115/72 | Ht 66.5 in | Wt 260.0 lb

## 2018-04-12 DIAGNOSIS — M1711 Unilateral primary osteoarthritis, right knee: Secondary | ICD-10-CM | POA: Diagnosis not present

## 2018-04-12 MED ORDER — METHYLPREDNISOLONE ACETATE 40 MG/ML IJ SUSP
40.0000 mg | Freq: Once | INTRAMUSCULAR | Status: AC
  Administered 2018-04-12: 40 mg via INTRA_ARTICULAR

## 2018-04-12 NOTE — Progress Notes (Signed)
. Patient ID: Laura [name], female   DOB: [birth year], 63 y.o.   MRN: [id number]  Subjective:   PCP: Dr. Dema Severin  Knee Pain    63 yo F here for right knee pain  02/29/12: Patient has known history of mod-severe DJD of bilateral knees. Last cortisone injections 3 months ago - started wearing off recently and going on a trip to [place] Has h/o PEs, on anticoagulation with coumadin - last INR in past week was 2.0. Pain currently worse in right knee than left - more lateral than medial but worse medially on left knee.  09/10/13: Patient returns today for bilateral knee injections. Tried synvisc twice since last visit here. First set helped for 3 months - second one did not help. At least a few months relief with cortisone shots in past. No new injuries.  01/15/14: Patient reports her knees started bothering her a couple weeks ago. Did well with cortisone shots. Taking tylenol.  04/17/14: Patient returns for bilateral knee injections for DJD. Did well since last visit and just recently they started bothering her again. Left knee 6/10 pain, right 4/10. No catching, locking, giving out.  4/18: Patient returns for repeat injections - both knees 3/10 level. She's also interested in viscosupplementation - working on getting approval Jacklyn Shell, our usual, is not approved by her insurance).  10/17: Patient returns for bilateral knee injections. Not much benefit last visit from cortisone injections - she did supartz by orthopedics and also not a whole lot of benefit. Constant dull ache in both knees 4/10 right, 6/10 left. Pain anterior, more medial. No catching, locking, giving out. Still bothered by her achilles on right as well - no new injuries. Doing some of the exercises. Has heel lifts. No skin changes, fever, other complaints.  04/08/15: Patient returns as pain started to recur past couple weeks in both knees. Pain level 0/10 at rest, up to 4/10 on left, 2/10 on  right especially with walking. Pain is sharp, anteromedial. Worse when getting up from seated position also. No skin changes, fever, other complaints.  4/26: Patient returns for bilateral knee injections. Pain level 5/10 bilaterally now. Worse with walking. No skin changes, numbness.  7/18: Patient returns with 3/10 pain in both knees. Worse first thing in morning, when getting up from prolonged sitting. Pain is dull. No skin changes, numbness. She would like bilateral knee injections.  12/25/15: Patient reports her knee pain has worsened. Pain right knee 4/10, left 6/10 anterior, dull. Worse with walking. No pain at rest. Would like to try viscosupplementation if cortisone injections don't help as much this time. No skin changes, numbness.  07/15/16: Patient returns with pain in both knees - up to 6/10 on left, 3/10 on right and sharp, anterior. Pain is 0/10 at rest bilaterally. Worse with ambulation. No skin changes, numbness.  06/14/17: Patient is doing very well from her knee replacement of left knee. Right knee has started worsening over last few weeks. Pain level 5/10, worse with walking. No skin changes, numbness.  7/12: Patient reports her right knee started flaring up about 1 month ago. Worse with walking. Pain currently 0/10 at rest. No skin changes,numbness  10/21: Patient returns for right knee pain. 0/10 at rest, 6/10 with activity. She got about 3 months of relief from previous steroid injection. No swelling or erythema. No locking or giving out. No skin changes  04/12/18: Patient reports her pain came back anterior right knee about a month ago. Difficulty walking due  to pain - feels like can't straighten knee out all the way now. No skin changes.  Past Medical History:  Diagnosis Date  . Anemia, normocytic normochromic 07/04/2017   Chronic Hb 10 +/- 0.5 gms  . Edema   . 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 Herpes   . High blood  pressure   . History of blood clots   . Obesity   . Partial tear of Achilles tendon   . Pneumonia   . 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 Pulmonary embolism ([place])   . 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 Vitamin D deficiency     Current Outpatient Medications on File Prior to Visit  Medication Sig Dispense Refill  . lisinopril-hydrochlorothiazide (PRINZIDE,ZESTORETIC) 20-25 MG tablet TAKE 1 TABLET BY MOUTH ONCE DAILY FOR 90 DAYS  1  . rivaroxaban (XARELTO) 10 MG TABS tablet Take 1 tablet (10 mg total) by mouth daily. 90 tablet 3  . Vitamin D, Ergocalciferol, (DRISDOL) 1.25 MG (50000 UT) CAPS capsule Take 1 capsule (50,000 Units total) by mouth every 7 (seven) days. 4 capsule 0   No current facility-administered medications on file prior to visit.     Past Surgical History:  Procedure Laterality Date  . BREAST BIOPSY    . [place]  . 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 Marital status: Married    Spouse name: Kadince Boxley  . Number of children: 2  . Years of education: Not on file  . Highest education level: Not on file  Occupational History  . Occupation: Engineer, building services: [employer]  . Financial resource strain: Not on file  . Food insecurity:    Worry: Not on file    Inability: Not on file  . Transportation needs:    Medical: Not on file    Non-medical: Not on file  Tobacco Use  . Smoking status: Never Smoker  . Smokeless tobacco: Never Used  Substance and Sexual Activity  . Alcohol use: Yes    Alcohol/week: 0.0 standard drinks    Comment: Rarely.  . Drug use: No  . Sexual activity: Not  on file  Lifestyle  . Physical activity:    Days per week: Not on file    Minutes per session: Not on file  . Stress: Not on file  Relationships  . Social connections:    Talks on phone: Not on file    Gets together: Not on file    Attends religious service: Not on file    Active member of club or organization: Not on file    Attends meetings of clubs or organizations: Not on file    Relationship status: Not on file  . Intimate partner violence:    Fear of current or ex partner: Not on file    Emotionally abused: Not on file    Physically abused: Not on file    Forced sexual activity: Not on file  Other Topics Concern  . Not on file  Social History Narrative  . Not on file    Family History  Problem Relation Age of Onset  . Hypertension Mother   . Heart failure Mother   . Cancer Mother   . Depression Mother   . Sleep apnea Mother   . Obesity Mother   . Prostate cancer Father   . Multiple myeloma Father   . Heart failure Father   . Obesity Father   . Sleep apnea Father   . Kidney disease Father   . Sudden Cardiac Death Father   . Hypertension Father   . Heart attack Neg Hx   . Hyperlipidemia Neg Hx   . Diabetes Neg Hx   . Sudden death Neg Hx   . Breast cancer Neg Hx     BP 115/72   Ht 5' 6.5" (1.689 m)   Wt 260 lb (117.9 kg)   BMI 41.34 kg/m   Review of Systems See HPI above.    Objective:   Physical Exam  Gen: NAD, comfortable in exam room Exam not repeated today - able to fully extend but painful right knee.  Right knee: No obvious deformity or swelling Mild medial joint line TTP Full ROM with 5/5 strength  NV intact    Assessment & Plan:  1. Right knee pain secondary to known OA - repeated steroid injection today.  Encouraged her to follow-up with orthopedics to discuss replacement of this knee - she would like to wait to have it done until this summer.    After informed written consent timeout was performed, patient was seated on exam table.  Right knee was prepped with alcohol swab and utilizing anterolateral approach, patient's right knee was injected intraarticularly with 3:1 marcaine: depomedrol. Patient tolerated the procedure well without immediate complications.

## 2018-04-12 NOTE — Addendum Note (Signed)
Addended by: Rutha Bouchard E on: 04/12/2018 11:28 AM   Modules accepted: Orders

## 2018-04-23 ENCOUNTER — Ambulatory Visit (INDEPENDENT_AMBULATORY_CARE_PROVIDER_SITE_OTHER): Payer: BC Managed Care – PPO | Admitting: Family Medicine

## 2018-04-23 ENCOUNTER — Encounter (INDEPENDENT_AMBULATORY_CARE_PROVIDER_SITE_OTHER): Payer: Self-pay

## 2018-05-02 ENCOUNTER — Ambulatory Visit (INDEPENDENT_AMBULATORY_CARE_PROVIDER_SITE_OTHER): Payer: BC Managed Care – PPO | Admitting: Family Medicine

## 2018-05-09 ENCOUNTER — Encounter (INDEPENDENT_AMBULATORY_CARE_PROVIDER_SITE_OTHER): Payer: Self-pay | Admitting: Family Medicine

## 2018-05-09 ENCOUNTER — Ambulatory Visit (INDEPENDENT_AMBULATORY_CARE_PROVIDER_SITE_OTHER): Payer: BC Managed Care – PPO | Admitting: Family Medicine

## 2018-05-09 VITALS — BP 114/74 | HR 77 | Temp 98.5°F | Ht 66.0 in | Wt 273.0 lb

## 2018-05-09 DIAGNOSIS — R0602 Shortness of breath: Secondary | ICD-10-CM

## 2018-05-09 DIAGNOSIS — Z9189 Other specified personal risk factors, not elsewhere classified: Secondary | ICD-10-CM

## 2018-05-09 DIAGNOSIS — Z6841 Body Mass Index (BMI) 40.0 and over, adult: Secondary | ICD-10-CM

## 2018-05-09 DIAGNOSIS — E559 Vitamin D deficiency, unspecified: Secondary | ICD-10-CM

## 2018-05-09 MED ORDER — VITAMIN D (ERGOCALCIFEROL) 1.25 MG (50000 UNIT) PO CAPS: 50000.0000 [IU] | ORAL_CAPSULE | ORAL | 0 refills | Status: DC

## 2018-05-09 NOTE — Progress Notes (Signed)
Office: [phone]  /  Fax: [phone]   HPI:   Chief Complaint: OBESITY Laura [name] is here to discuss her progress with her obesity treatment plan. She is on the Category 2 plan and is following her eating plan approximately 80 % of the time. She states she is riding a bike 30 minutes 2 times per week. Laura [name] continues to lose weight, but is frustrated by how slow it is going. Her metabolism was very low on her 1st visit and she has been working on improving this with exercise and proper nutrition.  Her weight is 273 lb (123.8 kg) today and has had a weight loss of 1 pound over a period of 4 weeks since her last visit. She has lost 5 lbs since starting treatment with [place].  Dyspnea with Exercise Laura [name] notes increasing shortness of breath with exercising and seems to be worsening over time with weight gain. She notes getting out of breath sooner with activity than she used to. This has not gotten worse recently. Laura [name]'s VO2 was 152 and below  expected last visit suggesting decreased RMR with exercise intolerance. She has been working on increasing exercise.  Vitamin D deficiency Laura [name] has a diagnosis of vitamin D deficiency. She is currently stable on vit D and denies nausea, vomiting, or muscle weakness.  At risk for cardiovascular disease Laura [name] is at a higher than average risk for cardiovascular disease due to dyspnea and obesity. She currently denies any chest pain.  ASSESSMENT AND PLAN:  Shortness of breath on exertion  Vitamin D deficiency - Plan: Vitamin D, Ergocalciferol, (DRISDOL) 1.25 MG (50000 UT) CAPS capsule  At risk for heart disease  Class 3 severe obesity with serious comorbidity and body mass index (BMI) of 40.0 to 44.9 in adult, unspecified obesity type (HCC)  PLAN:  Dyspnea with exercise Laura [name]'s shortness of breath appears to be obesity related and exercise induced. The indirect calorimeter results showed VO2 of 152 and a REE of 1059. She has agreed to work on weight loss  and gradually increase exercise to treat her exercise induced shortness of breath. If Laura [name] follows our instructions and loses weight without improvement of her shortness of breath, we will plan to refer to pulmonology. [place] agrees to this plan and she will follow up in 2 weeks.  Vitamin D Deficiency Laura [name] was informed that low vitamin D levels contributes to fatigue and are associated with obesity, breast, and colon cancer. Laura [name] agrees to continue to take prescription Vit D _0 ,000 IU every week #4 with no refills and will follow up for routine testing of vitamin D, at least 2-3 times per year. She was informed of the risk of over-replacement of vitamin D and agrees to not increase her dose unless she discusses this with [place] first. Laura [name] agrees to follow up in 2 weeks as directed.  Cardiovascular risk counseling Laura [name] was given extended (15 minutes) coronary artery disease prevention counseling today. She is 63 y.o. female and has risk factors for heart disease including dyspnea and obesity. We discussed intensive lifestyle modifications today with an emphasis on specific weight loss instructions and strategies. Pt was also informed of the importance of increasing exercise and decreasing saturated fats to help prevent heart disease.  Obesity Laura [name] is currently in the action stage of change. As such, her goal is to continue with weight loss efforts. She has agreed to keep a food journal with 1200 calories and 75+ grams of protein.  Laura [name] has been instructed to work up to  a goal of 150 minutes of combined cardio and strengthening exercise per week for weight loss and overall health benefits. We discussed the following Behavioral Modification Strategies today: increasing lean protein intake, decreasing simple carbohydrates, and work on meal planning and easy cooking plans.  Laura [name] has agreed to follow up with our clinic in 2 weeks. She was informed of the importance of frequent follow up visits to maximize her  success with intensive lifestyle modifications for her multiple health conditions.  ALLERGIES: No Known Allergies  MEDICATIONS: Current Outpatient Medications on File Prior to Visit  Medication Sig Dispense Refill  . lisinopril-hydrochlorothiazide (PRINZIDE,ZESTORETIC) 20-25 MG tablet TAKE 1 TABLET BY MOUTH ONCE DAILY FOR 90 DAYS  1  . rivaroxaban (XARELTO) 10 MG TABS tablet Take 1 tablet (10 mg total) by mouth daily. 90 tablet 3   No current facility-administered medications on file prior to visit.     PAST MEDICAL HISTORY: Past Medical History:  Diagnosis Date  . Anemia, normocytic normochromic 07/04/2017   Chronic Hb 10 +/- 0.5 gms  . Edema   . 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 Herpes   . High blood pressure   . History of blood clots   . Obesity   . Partial tear of Achilles tendon   . Pneumonia   . 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 Pulmonary embolism ([place])   . 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 Vitamin D deficiency     PAST SURGICAL HISTORY: Past Surgical History:  Procedure Laterality Date  . BREAST BIOPSY    . [place]  . 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 Smoking status: Never Smoker  . Smokeless tobacco: Never Used  Substance Use Topics  . Alcohol use: Yes    Alcohol/week: 0.0 standard drinks    Comment: Rarely.  . Drug use: No    FAMILY HISTORY: Family History  Problem Relation Age of Onset  . Hypertension Mother   . Heart failure Mother   . Cancer Mother   . Depression Mother   . Sleep apnea Mother   . Obesity Mother   . Prostate cancer Father   . Multiple myeloma Father   . Heart failure  Father   . Obesity Father   . Sleep apnea Father   . Kidney disease Father   . Sudden Cardiac Death Father   . Hypertension Father   . Heart attack Neg Hx   . Hyperlipidemia Neg Hx   . Diabetes Neg Hx   . Sudden death Neg Hx   . Breast cancer Neg Hx     ROS: Review of Systems  Constitutional: Positive for weight loss.  Respiratory: Positive for shortness of breath ( with exercise).   Gastrointestinal: Negative for nausea and vomiting.  Musculoskeletal:       Negative for muscle weakness.    PHYSICAL EXAM: Blood pressure 114/74, pulse 77, temperature 98.5 F (36.9 C), temperature source Oral, height _0  (1.676 m), weight 273 lb (123.8 kg), SpO2 97 %. Body mass index is 44.06 kg/m. Physical Exam Vitals signs reviewed.  Constitutional:  Appearance: Normal appearance. She is obese.  Cardiovascular:     Rate and Rhythm: Normal rate.  Pulmonary:     Effort: Pulmonary effort is normal.  Musculoskeletal: Normal range of motion.  Skin:    General: Skin is warm and dry.  Neurological:     Mental Status: She is alert and oriented to person, place, and time.  Psychiatric:        Mood and Affect: Mood normal.        Behavior: Behavior normal.     RECENT LABS AND TESTS: BMET    Component Value Date/Time   NA 134 01/17/2018 1222   K 4.1 01/17/2018 1222   CL 95 (L) 01/17/2018 1222   CO2 26 01/17/2018 1222   GLUCOSE 79 01/17/2018 1222   GLUCOSE 129 (H) 09/07/2016 0437   BUN 15 01/17/2018 1222   CREATININE 0.87 01/17/2018 1222   CREATININE 0.83 05/13/2014 1158   CALCIUM 9.5 01/17/2018 1222   GFRNONAA 73 01/17/2018 1222   GFRAA 84 01/17/2018 1222   Lab Results  Component Value Date   HGBA1C 5.8 (H) 01/17/2018   Lab Results  Component Value Date   INSULIN 8.4 01/17/2018   CBC    Component Value Date/Time   WBC 8.6 01/17/2018 1222   WBC 13.8 (H) 09/08/2016 0409   RBC 4.19 01/17/2018 1222   RBC 3.63 (L) 09/08/2016 0409   HGB 12.1 01/17/2018 1222   HGB  12.8 05/29/2012 1502   HCT 37.4 01/17/2018 1222   HCT 40.0 05/29/2012 1502   PLT 289 07/04/2017 0938   MCV 89 01/17/2018 1222   MCV 90.3 05/29/2012 1502   MCH 28.9 01/17/2018 1222   MCH 28.4 09/08/2016 0409   MCHC 32.4 01/17/2018 1222   MCHC 30.7 09/08/2016 0409   RDW 13.0 01/17/2018 1222   RDW 14.9 (H) 05/29/2012 1502   LYMPHSABS 2.4 01/17/2018 1222   LYMPHSABS 2.9 05/29/2012 1502   MONOABS 0.7 05/13/2014 1158   MONOABS 0.7 05/29/2012 1502   EOSABS 0.3 01/17/2018 1222   BASOSABS 0.0 01/17/2018 1222   BASOSABS 0.0 05/29/2012 1502   Iron/TIBC/Ferritin/ %Sat    Component Value Date/Time   IRON 41 (L) 11/20/2008 1222   TIBC 319 11/20/2008 1222   FERRITIN 29 11/20/2008 1222   IRONPCTSAT 13 (L) 11/20/2008 1222   Lipid Panel     Component Value Date/Time   CHOL 191 01/17/2018 1222   TRIG 53 01/17/2018 1222   HDL 70 01/17/2018 1222   LDLCALC 110 (H) 01/17/2018 1222   Hepatic Function Panel     Component Value Date/Time   PROT 7.9 01/17/2018 1222   ALBUMIN 3.9 01/17/2018 1222   AST 17 01/17/2018 1222   ALT 9 01/17/2018 1222   ALKPHOS 82 01/17/2018 1222   BILITOT 0.5 01/17/2018 1222   BILIDIR 0.1 02/18/2008 1458   IBILI 0.3 02/18/2008 1458      Component Value Date/Time   TSH 1.190 01/17/2018 1222   Results for LYNNLEY, DODDRIDGE (MRN [id number]) as of 05/09/2018 14:30  Ref. Range 01/17/2018 12:22  Vitamin D, 25-Hydroxy Latest Ref Range: 30.0 - 100.0 ng/mL 29.2 (L)    OBESITY BEHAVIORAL INTERVENTION VISIT  Today's visit was # 6   Starting weight: 278 lbs Starting date: 01/17/18 Today's weight : Weight: 273 lb (123.8 kg)  Today's date: 05/09/2018 Total lbs lost to date: 5    05/09/2018  Height '5\' 6"'$  (1.676 m)  Weight 273 lb (123.8 kg)  BMI (Calculated) 44.08  BLOOD PRESSURE -  SYSTOLIC 407  BLOOD PRESSURE - DIASTOLIC 74   Body Fat % 68.0 %  Total Body Water (lbs) 98 lbs   ASK: We discussed the diagnosis of obesity with Laura [name] today and Laura [name] agreed to give  [place] permission to discuss obesity behavioral modification therapy today.  ASSESS: Laura [name] has the diagnosis of obesity and her BMI today is 44.08. Laura [name] is in the action stage of change.   ADVISE: Danell was educated on the multiple health risks of obesity as well as the benefit of weight loss to improve her health. She was advised of the need for long term treatment and the importance of lifestyle modifications to improve her current health and to decrease her risk of future health problems.  AGREE: Multiple dietary modification options and treatment options were discussed and Laura [name] agreed to follow the recommendations documented in the above note.  ARRANGE: Laura [name] was educated on the importance of frequent visits to treat obesity as outlined per CMS and USPSTF guidelines and agreed to schedule her next follow up appointment today.  IMarcille Blanco, CMA, am acting as transcriptionist for Starlyn Skeans, MD  I have reviewed the above documentation for accuracy and completeness, and I agree with the above. -Dennard Nip, MD

## 2018-05-14 ENCOUNTER — Encounter: Payer: Self-pay | Admitting: *Deleted

## 2018-05-22 ENCOUNTER — Ambulatory Visit (INDEPENDENT_AMBULATORY_CARE_PROVIDER_SITE_OTHER): Payer: BC Managed Care – PPO | Admitting: Family Medicine

## 2018-05-22 ENCOUNTER — Encounter (INDEPENDENT_AMBULATORY_CARE_PROVIDER_SITE_OTHER): Payer: Self-pay

## 2018-05-28 ENCOUNTER — Encounter (INDEPENDENT_AMBULATORY_CARE_PROVIDER_SITE_OTHER): Payer: Self-pay | Admitting: Physician Assistant

## 2018-05-28 ENCOUNTER — Other Ambulatory Visit: Payer: Self-pay

## 2018-05-28 ENCOUNTER — Ambulatory Visit (INDEPENDENT_AMBULATORY_CARE_PROVIDER_SITE_OTHER): Payer: BC Managed Care – PPO | Admitting: Physician Assistant

## 2018-05-28 VITALS — BP 118/75 | HR 79 | Temp 97.7°F | Ht 66.0 in | Wt 277.0 lb

## 2018-05-28 DIAGNOSIS — Z9189 Other specified personal risk factors, not elsewhere classified: Secondary | ICD-10-CM

## 2018-05-28 DIAGNOSIS — E7849 Other hyperlipidemia: Secondary | ICD-10-CM

## 2018-05-28 DIAGNOSIS — E559 Vitamin D deficiency, unspecified: Secondary | ICD-10-CM | POA: Diagnosis not present

## 2018-05-28 DIAGNOSIS — Z6841 Body Mass Index (BMI) 40.0 and over, adult: Secondary | ICD-10-CM

## 2018-05-28 MED ORDER — VITAMIN D (ERGOCALCIFEROL) 1.25 MG (50000 UNIT) PO CAPS: 50000.0000 [IU] | ORAL_CAPSULE | ORAL | 0 refills | Status: DC

## 2018-05-28 NOTE — Progress Notes (Signed)
Office: [phone]  /  Fax: [phone]   HPI:   Chief Complaint: OBESITY Laura [name] is here to discuss her progress with her obesity treatment plan. She is on the keep a food journal with 1200 calories and 75+ grams of protein daily and is following her eating plan approximately 50 % of the time. She states she is exercising 0 minutes 0 times per week. Asiya reports that she does not like journaling and wants to switch back to a structured plan. She is overeating her snack calories.  Her weight is 277 lb (125.6 kg) today and has gained 4 pounds since her last visit. She has lost 1 lb since starting treatment with [place].  Vitamin D Deficiency Laura [name] has a diagnosis of vitamin D deficiency. She is currently taking prescription Vit D and denies nausea, vomiting or muscle weakness.  Hyperlipidemia Laura [name] has hyperlipidemia and has been trying to improve her cholesterol levels with intensive lifestyle modification including a low saturated fat diet, exercise and weight loss. She is not on medications and denies any chest pain, claudication or myalgias.  At risk for cardiovascular disease Laura [name] is at a higher than average risk for cardiovascular disease due to obesity and hyperlipidemia. She currently denies any chest pain.  ALLERGIES: No Known Allergies  MEDICATIONS: Current Outpatient Medications on File Prior to Visit  Medication Sig Dispense Refill  . lisinopril-hydrochlorothiazide (PRINZIDE,ZESTORETIC) 20-25 MG tablet TAKE 1 TABLET BY MOUTH ONCE DAILY FOR 90 DAYS  1  . rivaroxaban (XARELTO) 10 MG TABS tablet Take 1 tablet (10 mg total) by mouth daily. 90 tablet 3   No current facility-administered medications on file prior to visit.     PAST MEDICAL HISTORY: Past Medical History:  Diagnosis Date  . Anemia, normocytic normochromic 07/04/2017   Chronic Hb 10 +/- 0.5 gms  . Edema   . 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 Herpes   . High blood pressure   . History of blood  clots   . Obesity   . Partial tear of Achilles tendon   . Pneumonia   . 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 Pulmonary embolism ([place])   . 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 Vitamin D deficiency     PAST SURGICAL HISTORY: Past Surgical History:  Procedure Laterality Date  . BREAST BIOPSY    . [place]  . 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 Smoking status: Never Smoker  . Smokeless tobacco: Never Used  Substance Use Topics  . Alcohol use: Yes    Alcohol/week: 0.0 standard drinks    Comment: Rarely.  . Drug use: No    FAMILY HISTORY: Family History  Problem Relation Age of Onset  . Hypertension Mother   . Heart failure Mother   . Cancer Mother   . Depression Mother   . Sleep apnea Mother   . Obesity Mother   . Prostate cancer Father   . Multiple myeloma Father   . Heart failure Father   . Obesity Father   . Sleep apnea Father   . Kidney disease Father   . Sudden Cardiac Death  Father   . Hypertension Father   . Heart attack Neg Hx   . Hyperlipidemia Neg Hx   . Diabetes Neg Hx   . Sudden death Neg Hx   . Breast cancer Neg Hx     ROS: Review of Systems  Constitutional: Negative for weight loss.  Cardiovascular: Negative for chest pain and claudication.  Gastrointestinal: Negative for nausea and vomiting.  Musculoskeletal: Negative for myalgias.       Negative muscle weakness    PHYSICAL EXAM: Blood pressure 118/75, pulse 79, temperature 97.7 F (36.5 C), height 5' 6" (1.676 m), weight 277 lb (125.6 kg), SpO2 97 %. Body mass index is 44.71 kg/m. Physical Exam Vitals signs reviewed.  Constitutional:      Appearance:  Normal appearance. She is obese.  Cardiovascular:     Rate and Rhythm: Normal rate.     Pulses: Normal pulses.  Pulmonary:     Effort: Pulmonary effort is normal.     Breath sounds: Normal breath sounds.  Musculoskeletal: Normal range of motion.  Skin:    General: Skin is warm and dry.  Neurological:     Mental Status: She is alert and oriented to person, place, and time.  Psychiatric:        Mood and Affect: Mood normal.        Behavior: Behavior normal.     RECENT LABS AND TESTS: BMET    Component Value Date/Time   NA 134 01/17/2018 1222   K 4.1 01/17/2018 1222   CL 95 (L) 01/17/2018 1222   CO2 26 01/17/2018 1222   GLUCOSE 79 01/17/2018 1222   GLUCOSE 129 (H) 09/07/2016 0437   BUN 15 01/17/2018 1222   CREATININE 0.87 01/17/2018 1222   CREATININE 0.83 05/13/2014 1158   CALCIUM 9.5 01/17/2018 1222   GFRNONAA 73 01/17/2018 1222   GFRAA 84 01/17/2018 1222   Lab Results  Component Value Date   HGBA1C 5.8 (H) 01/17/2018   Lab Results  Component Value Date   INSULIN 8.4 01/17/2018   CBC    Component Value Date/Time   WBC 8.6 01/17/2018 1222   WBC 13.8 (H) 09/08/2016 0409   RBC 4.19 01/17/2018 1222   RBC 3.63 (L) 09/08/2016 0409   HGB 12.1 01/17/2018 1222   HGB 12.8 05/29/2012 1502   HCT 37.4 01/17/2018 1222   HCT 40.0 05/29/2012 1502   PLT 289 07/04/2017 0938   MCV 89 01/17/2018 1222   MCV 90.3 05/29/2012 1502   MCH 28.9 01/17/2018 1222   MCH 28.4 09/08/2016 0409   MCHC 32.4 01/17/2018 1222   MCHC 30.7 09/08/2016 0409   RDW 13.0 01/17/2018 1222   RDW 14.9 (H) 05/29/2012 1502   LYMPHSABS 2.4 01/17/2018 1222   LYMPHSABS 2.9 05/29/2012 1502   MONOABS 0.7 05/13/2014 1158   MONOABS 0.7 05/29/2012 1502   EOSABS 0.3 01/17/2018 1222   BASOSABS 0.0 01/17/2018 1222   BASOSABS 0.0 05/29/2012 1502   Iron/TIBC/Ferritin/ %Sat    Component Value Date/Time   IRON 41 (L) 11/20/2008 1222   TIBC 319 11/20/2008 1222   FERRITIN 29 11/20/2008 1222   IRONPCTSAT 13 (L)  11/20/2008 1222   Lipid Panel     Component Value Date/Time   CHOL 191 01/17/2018 1222   TRIG 53 01/17/2018 1222   HDL 70 01/17/2018 1222   LDLCALC 110 (H) 01/17/2018 1222   Hepatic Function Panel     Component Value Date/Time   PROT 7.9 01/17/2018 1222   ALBUMIN 3.9 01/17/2018  1222   AST 17 01/17/2018 1222   ALT 9 01/17/2018 1222   ALKPHOS 82 01/17/2018 1222   BILITOT 0.5 01/17/2018 1222   BILIDIR 0.1 02/18/2008 1458   IBILI 0.3 02/18/2008 1458      Component Value Date/Time   TSH 1.190 01/17/2018 1222    ASSESSMENT AND PLAN: Vitamin D deficiency - Plan: Vitamin D, Ergocalciferol, (DRISDOL) 1.25 MG (50000 UT) CAPS capsule  Other hyperlipidemia  At risk for heart disease  Class 3 severe obesity with serious comorbidity and body mass index (BMI) of 40.0 to 44.9 in adult, unspecified obesity type (HCC)  PLAN:  Vitamin D Deficiency Laura [name] was informed that low vitamin D levels contributes to fatigue and are associated with obesity, breast, and colon cancer. Laura [name] agrees to continue taking prescription Vit D _0 ,000 IU every week #4 and we will refill for 1 month. She will follow up for routine testing of vitamin D, at least 2-3 times per year. She was informed of the risk of over-replacement of vitamin D and agrees to not increase her dose unless she discusses this with [place] first. Laura [name] agrees to follow up with our clinic in 2 weeks.  Hyperlipidemia Laura [name] was informed of the American Heart Association Guidelines emphasizing intensive lifestyle modifications as the first line treatment for hyperlipidemia. We discussed many lifestyle modifications today in depth, and Laura [name] will continue to work on decreasing saturated fats such as fatty red meat, butter and many fried foods. She will also increase vegetables and lean protein in her diet and continue to work on exercise and weight loss efforts. Laura [name] agrees to follow up with our clinic in 2 weeks.  Cardiovascular risk counseling Laura [name]  was given extended (15 minutes) coronary artery disease prevention counseling today. She is 63 y.o. female and has risk factors for heart disease including obesity and hyperlipidemia. We discussed intensive lifestyle modifications today with an emphasis on specific weight loss instructions and strategies. Pt was also informed of the importance of increasing exercise and decreasing saturated fats to help prevent heart disease.  Obesity Laura [name] is currently in the action stage of change. As such, her goal is to continue with weight loss efforts She has agreed to change to follow the Category 2 plan Laura [name] has been instructed to work up to a goal of 150 minutes of combined cardio and strengthening exercise per week for weight loss and overall health benefits. We discussed the following Behavioral Modification Strategies today: work on meal planning and easy cooking plans and planning for success   Laura [name] has agreed to follow up with our clinic in 2 weeks. She was informed of the importance of frequent follow up visits to maximize her success with intensive lifestyle modifications for her multiple health conditions.   OBESITY BEHAVIORAL INTERVENTION VISIT  Today's visit was # 7   Starting weight: 278 lbs Starting date: 01/17/18 Today's weight : 277 lbs  Today's date: 05/28/2018 Total lbs lost to date: 1    05/28/2018  Height 5' 6" (1.676 m)  Weight 277 lb (125.6 kg)  BMI (Calculated) 44.73  BLOOD PRESSURE - SYSTOLIC 275  BLOOD PRESSURE - DIASTOLIC 75   Body Fat % 55 %     ASK: We discussed the diagnosis of obesity with Laura [name] today and Laura [name] agreed to give [place] permission to discuss obesity behavioral modification therapy today.  ASSESS: Laura [name] has the diagnosis of obesity and her BMI today is 44.73 Laura [name] is in the action stage of change   ADVISE:  Laura [name] was educated on the multiple health risks of obesity as well as the benefit of weight loss to improve her health. She was advised of the  need for long term treatment and the importance of lifestyle modifications.  AGREE: Multiple dietary modification options and treatment options were discussed and  Azilee agreed to the above obesity treatment plan.  Laura [name], am acting as transcriptionist for Abby Potash, PA-C I, Abby Potash, PA-C have reviewed above note and agree with its content

## 2018-06-05 ENCOUNTER — Encounter (INDEPENDENT_AMBULATORY_CARE_PROVIDER_SITE_OTHER): Payer: Self-pay

## 2018-06-06 ENCOUNTER — Encounter (INDEPENDENT_AMBULATORY_CARE_PROVIDER_SITE_OTHER): Payer: Self-pay

## 2018-06-13 ENCOUNTER — Other Ambulatory Visit: Payer: Self-pay

## 2018-06-13 ENCOUNTER — Encounter (INDEPENDENT_AMBULATORY_CARE_PROVIDER_SITE_OTHER): Payer: Self-pay | Admitting: Family Medicine

## 2018-06-13 ENCOUNTER — Ambulatory Visit (INDEPENDENT_AMBULATORY_CARE_PROVIDER_SITE_OTHER): Payer: BC Managed Care – PPO | Admitting: Family Medicine

## 2018-06-13 DIAGNOSIS — Z6841 Body Mass Index (BMI) 40.0 and over, adult: Secondary | ICD-10-CM

## 2018-06-13 DIAGNOSIS — R252 Cramp and spasm: Secondary | ICD-10-CM

## 2018-06-13 DIAGNOSIS — E559 Vitamin D deficiency, unspecified: Secondary | ICD-10-CM

## 2018-06-13 DIAGNOSIS — E66813 Obesity, class 3: Secondary | ICD-10-CM

## 2018-06-13 DIAGNOSIS — E781 Pure hyperglyceridemia: Secondary | ICD-10-CM

## 2018-06-13 DIAGNOSIS — R7303 Prediabetes: Secondary | ICD-10-CM

## 2018-06-13 MED ORDER — METFORMIN HCL 500 MG PO TABS: 500.0000 mg | ORAL_TABLET | Freq: Every day | ORAL | 0 refills | Status: DC

## 2018-06-13 MED ORDER — VITAMIN D (ERGOCALCIFEROL) 1.25 MG (50000 UNIT) PO CAPS: 50000.0000 [IU] | ORAL_CAPSULE | ORAL | 0 refills | Status: DC

## 2018-06-13 NOTE — Progress Notes (Signed)
Office: [phone]  /  Fax: [phone] TeleHealth Visit:  Laura [name] has consented to this TeleHealth visit today via Face Time. The patient is located at home, the provider is located at the [facility] office. The participants in this visit include the listed provider and patient.  HPI:   Chief Complaint: OBESITY Laura [name] is here to discuss her progress with her obesity treatment plan. She is on the Category 2 plan and is following her eating plan approximately 60 % of the time. She states she is biking and walking 30 to 45 minutes 4 to 5 times per week. Laura [name] has been working on following her plan sometimes and she feels that she has lost 2 to 4 pounds since our last visit. Laura [name] is not following her plan closely and simple carbs are still higher than ideal.  We were unable to weigh the patient today for this TeleHealth visit. She feels as if she has lost weight since her last visit. She has lost 1 lb since starting treatment with [place].  Vitamin D Deficiency Laura [name] has a diagnosis of vitamin D deficiency. She is currently stable on vit D, but is not yet at goal. Laura [name] denies nausea, vomiting, or muscle weakness.  Pre-Diabetes Laura [name] has a diagnosis of pre-diabetes based on her elevated Hgb A1c of 5.8 on 01/17/18. She is working on diet and weight loss, but is still struggling with polyphagia. She was informed this puts her at greater risk of developing diabetes. She is not taking metformin currently and continues to work on diet and exercise to decrease risk of diabetes.   Hyperlipidemia (Pure) Laura [name] has hyperlipidemia and her LDL was above goal at 110 on 01/17/18. She has been working on diet and weight loss to improve her cholesterol levels with intensive lifestyle modification including a low saturated fat diet, exercise and weight loss. Laura [name] is due for labs. She denies any chest pain or myalgias.  Muscle Cramping  Laura [name] notes some cramping in her left arm and legs at times and  wonders if this is due to an electrolyte imbalance. She takes a potassium chloride supplement occasionally and feels that this helps.  ASSESSMENT AND PLAN:  Vitamin D deficiency - Plan: VITAMIN D 25 Hydroxy (Vit-D Deficiency, Fractures), Vitamin D, Ergocalciferol, (DRISDOL) 1.25 MG (50000 UT) CAPS capsule  Prediabetes - Plan: Comprehensive metabolic panel, Hemoglobin A1c, Insulin, random, metFORMIN (GLUCOPHAGE) 500 MG tablet  Pure hypertriglyceridemia - Plan: Lipid Panel With LDL/HDL Ratio  Muscle cramps  Class 3 severe obesity with serious comorbidity and body mass index (BMI) of 40.0 to 44.9 in adult, unspecified obesity type (HCC)  PLAN:  Vitamin D Deficiency Laura [name] was informed that low vitamin D levels contribute to fatigue and are associated with obesity, breast, and colon cancer. Laura [name] agrees to continue to take prescription Vit D @50 ,000 IU every week #4 with no refills and will follow up for routine testing of vitamin D, at least 2-3 times per year. She was informed of the risk of over-replacement of vitamin D and agrees to not increase her dose unless she discusses this with [place] first. Laura [name] agrees to follow up in 4 weeks as directed.  Pre-Diabetes Laura [name] will continue to work on weight loss, exercise, and decreasing simple carbohydrates in her diet to help decrease the risk of diabetes. She was informed that eating too many simple carbohydrates or too many calories at one sitting increases the likelihood of GI side effects. Laura [name] agreed to start metformin 500 mg qAM #  30 with no refills and a prescription was written today. Labs were ordered today and Laura [name] agreed to follow up with [place] as directed to monitor her progress in 2 weeks.   Hyperlipidemia (Pure) Laura [name] was informed of the American Heart Association Guidelines emphasizing intensive lifestyle modifications as the first line treatment for hyperlipidemia. We discussed many lifestyle modifications today in depth, and Laura [name] will continue to  work on decreasing saturated fats such as fatty red meat, butter and many fried foods. She will also increase vegetables and lean protein in her diet and continue to work on exercise and weight loss efforts. We ordered labs today and Laura [name] agrees to continue diet and exercise. She agrees with this plan and will follow up as directed.  Muscle Cramping  We will order a CMP and Laura [name] agrees to follow up in 2 weeks to monitor her progress.  Obesity Laura [name] is currently in the action stage of change. As such, her goal is to continue with weight loss efforts. She has agreed to follow the Category 2 plan. Laura [name] has been instructed to work up to a goal of 150 minutes of combined cardio and strengthening exercise per week for weight loss and overall health benefits. We discussed the following Behavioral Modification Strategies today: increasing lean protein intake, decreasing simple carbohydrates, increasing vegetables, work on meal planning and easy cooking plans, emotional eating strategies, ways to avoid boredom eating, no skipping meals, and ways to avoid night time snacking.  Laura [name] has agreed to follow up with our clinic in 2 weeks. She was informed of the importance of frequent follow up visits to maximize her success with intensive lifestyle modifications for her multiple health conditions.  ALLERGIES: No Known Allergies  MEDICATIONS: Current Outpatient Medications on File Prior to Visit  Medication Sig Dispense Refill  . lisinopril-hydrochlorothiazide (PRINZIDE,ZESTORETIC) 20-25 MG tablet TAKE 1 TABLET BY MOUTH ONCE DAILY FOR 90 DAYS  1  . rivaroxaban (XARELTO) 10 MG TABS tablet Take 1 tablet (10 mg total) by mouth daily. 90 tablet 3   No current facility-administered medications on file prior to visit.     PAST MEDICAL HISTORY: Past Medical History:  Diagnosis Date  . Anemia, normocytic normochromic 07/04/2017   Chronic Hb 10 +/- 0.5 gms  . Edema   . Frozen, subsequent encounter 02/2013    Diaphragm  . Herpes   . High blood pressure   . History of blood clots   . Obesity   . Partial tear of Achilles tendon   . Pneumonia   . 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 Pulmonary embolism ([place])   . 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 Vitamin D deficiency     PAST SURGICAL HISTORY: Past Surgical History:  Procedure Laterality Date  . BREAST BIOPSY    . [place]  . 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 Smoking status: Never Smoker  . Smokeless tobacco: Never Used  Substance Use Topics  . Alcohol use: Yes    Alcohol/week: 0.0 standard drinks    Comment: Rarely.  . Drug use: No    FAMILY HISTORY: Family History  Problem Relation  Age of Onset  . Hypertension Mother   . Heart failure Mother   . Cancer Mother   . Depression Mother   . Sleep apnea Mother   . Obesity Mother   . Prostate cancer Father   . Multiple myeloma Father   . Heart failure Father   . Obesity Father   . Sleep apnea Father   . Kidney disease Father   . Sudden Cardiac Death Father   . Hypertension Father   . Heart attack Neg Hx   . Hyperlipidemia Neg Hx   . Diabetes Neg Hx   . Sudden death Neg Hx   . Breast cancer Neg Hx     ROS: Review of Systems  Cardiovascular: Negative for chest pain.  Gastrointestinal: Negative for nausea and vomiting.  Musculoskeletal: Negative for myalgias.       Negative for muscle weakness. Positive for muscle cramping.  Endo/Heme/Allergies:       Positive for polyphagia.    PHYSICAL EXAM: Pt in no acute distress  RECENT LABS AND TESTS: BMET    Component Value Date/Time   NA 134 01/17/2018 1222   K 4.1 01/17/2018 1222    CL 95 (L) 01/17/2018 1222   CO2 26 01/17/2018 1222   GLUCOSE 79 01/17/2018 1222   GLUCOSE 129 (H) 09/07/2016 0437   BUN 15 01/17/2018 1222   CREATININE 0.87 01/17/2018 1222   CREATININE 0.83 05/13/2014 1158   CALCIUM 9.5 01/17/2018 1222   GFRNONAA 73 01/17/2018 1222   GFRAA 84 01/17/2018 1222   Lab Results  Component Value Date   HGBA1C 5.8 (H) 01/17/2018   Lab Results  Component Value Date   INSULIN 8.4 01/17/2018   CBC    Component Value Date/Time   WBC 8.6 01/17/2018 1222   WBC 13.8 (H) 09/08/2016 0409   RBC 4.19 01/17/2018 1222   RBC 3.63 (L) 09/08/2016 0409   HGB 12.1 01/17/2018 1222   HGB 12.8 05/29/2012 1502   HCT 37.4 01/17/2018 1222   HCT 40.0 05/29/2012 1502   PLT 289 07/04/2017 0938   MCV 89 01/17/2018 1222   MCV 90.3 05/29/2012 1502   MCH 28.9 01/17/2018 1222   MCH 28.4 09/08/2016 0409   MCHC 32.4 01/17/2018 1222   MCHC 30.7 09/08/2016 0409   RDW 13.0 01/17/2018 1222   RDW 14.9 (H) 05/29/2012 1502   LYMPHSABS 2.4 01/17/2018 1222   LYMPHSABS 2.9 05/29/2012 1502   MONOABS 0.7 05/13/2014 1158   MONOABS 0.7 05/29/2012 1502   EOSABS 0.3 01/17/2018 1222   BASOSABS 0.0 01/17/2018 1222   BASOSABS 0.0 05/29/2012 1502   Iron/TIBC/Ferritin/ %Sat    Component Value Date/Time   IRON 41 (L) 11/20/2008 1222   TIBC 319 11/20/2008 1222   FERRITIN 29 11/20/2008 1222   IRONPCTSAT 13 (L) 11/20/2008 1222   Lipid Panel     Component Value Date/Time   CHOL 191 01/17/2018 1222   TRIG 53 01/17/2018 1222   HDL 70 01/17/2018 1222   LDLCALC 110 (H) 01/17/2018 1222   Hepatic Function Panel     Component Value Date/Time   PROT 7.9 01/17/2018 1222   ALBUMIN 3.9 01/17/2018 1222   AST 17 01/17/2018 1222   ALT 9 01/17/2018 1222   ALKPHOS 82 01/17/2018 1222   BILITOT 0.5 01/17/2018 1222   BILIDIR 0.1 02/18/2008 1458   IBILI 0.3 02/18/2008 1458      Component Value Date/Time   TSH 1.190 01/17/2018 1222   Results for LINDSI, BAYLISS (MRN [id number]) as  of 06/13/2018  10:26  Ref. Range 01/17/2018 12:22  Vitamin D, 25-Hydroxy Latest Ref Range: 30.0 - 100.0 ng/mL 29.2 (L)     I, Marcille Blanco, CMA, am acting as transcriptionist for Starlyn Skeans, MD I have reviewed the above documentation for accuracy and completeness, and I agree with the above. -Dennard Nip, MD

## 2018-06-22 LAB — LIPID PANEL WITH LDL/HDL RATIO
Cholesterol, Total: 180 mg/dL (ref 100–199)
HDL: 68 mg/dL (ref >39)
LDL Calculated: 101 mg/dL — ABNORMAL HIGH (ref 0–99)
LDl/HDL Ratio: 1.5 ratio (ref 0.0–3.2)
Triglycerides: 53 mg/dL (ref 0–149)
VLDL Cholesterol Cal: 11 mg/dL (ref 5–40)

## 2018-06-22 LAB — COMPREHENSIVE METABOLIC PANEL
ALT: 11 IU/L (ref 0–32)
AST: 17 IU/L (ref 0–40)
Albumin/Globulin Ratio: 1 — ABNORMAL LOW (ref 1.2–2.2)
Albumin: 3.9 g/dL (ref 3.8–4.9)
Alkaline Phosphatase: 71 IU/L (ref 39–117)
BUN/Creatinine Ratio: 23 (ref 12–28)
BUN: 20 mg/dL (ref 8–27)
Bilirubin Total: 0.4 mg/dL (ref 0.0–1.2)
CO2: 24 mmol/L (ref 20–29)
Calcium: 9.7 mg/dL (ref 8.7–10.3)
Chloride: 96 mmol/L (ref 96–106)
Creatinine, Ser: 0.88 mg/dL (ref 0.57–1.00)
GFR calc Af Amer: 83 mL/min/{1.73_m2} (ref >59)
GFR calc non Af Amer: 72 mL/min/{1.73_m2} (ref >59)
Globulin, Total: 3.8 g/dL (ref 1.5–4.5)
Glucose: 81 mg/dL (ref 65–99)
Potassium: 4.5 mmol/L (ref 3.5–5.2)
Sodium: 136 mmol/L (ref 134–144)
Total Protein: 7.7 g/dL (ref 6.0–8.5)

## 2018-06-22 LAB — VITAMIN D 25 HYDROXY (VIT D DEFICIENCY, FRACTURES): Vit D, 25-Hydroxy: 49.9 ng/mL (ref 30.0–100.0)

## 2018-06-22 LAB — HEMOGLOBIN A1C
Est. average glucose Bld gHb Est-mCnc: 123 mg/dL
Hgb A1c MFr Bld: 5.9 % — ABNORMAL HIGH (ref 4.8–5.6)

## 2018-06-22 LAB — INSULIN, RANDOM: INSULIN: 13.9 u[IU]/mL (ref 2.6–24.9)

## 2018-06-27 ENCOUNTER — Telehealth: Payer: Self-pay | Admitting: Hematology

## 2018-06-27 ENCOUNTER — Ambulatory Visit (INDEPENDENT_AMBULATORY_CARE_PROVIDER_SITE_OTHER): Payer: Self-pay | Admitting: Family Medicine

## 2018-06-27 NOTE — Telephone Encounter (Signed)
sw pt to confirm new patient appt 4/30 at 12 pm. Pt aware of date/time/location

## 2018-07-03 ENCOUNTER — Ambulatory Visit: Payer: BC Managed Care – PPO | Admitting: Sports Medicine

## 2018-07-06 ENCOUNTER — Other Ambulatory Visit: Payer: Self-pay | Admitting: Hematology

## 2018-07-06 DIAGNOSIS — D649 Anemia, unspecified: Secondary | ICD-10-CM

## 2018-07-06 DIAGNOSIS — I2782 Chronic pulmonary embolism: Secondary | ICD-10-CM

## 2018-07-09 ENCOUNTER — Ambulatory Visit (INDEPENDENT_AMBULATORY_CARE_PROVIDER_SITE_OTHER): Payer: BC Managed Care – PPO | Admitting: Family Medicine

## 2018-07-09 ENCOUNTER — Other Ambulatory Visit: Payer: Self-pay

## 2018-07-09 ENCOUNTER — Encounter (INDEPENDENT_AMBULATORY_CARE_PROVIDER_SITE_OTHER): Payer: Self-pay | Admitting: Family Medicine

## 2018-07-09 DIAGNOSIS — R7303 Prediabetes: Secondary | ICD-10-CM

## 2018-07-09 DIAGNOSIS — Z6841 Body Mass Index (BMI) 40.0 and over, adult: Secondary | ICD-10-CM

## 2018-07-09 DIAGNOSIS — E559 Vitamin D deficiency, unspecified: Secondary | ICD-10-CM | POA: Diagnosis not present

## 2018-07-09 DIAGNOSIS — E66813 Obesity, class 3: Secondary | ICD-10-CM

## 2018-07-09 MED ORDER — VITAMIN D (ERGOCALCIFEROL) 1.25 MG (50000 UNIT) PO CAPS: 50000.0000 [IU] | ORAL_CAPSULE | ORAL | 0 refills | Status: DC

## 2018-07-09 MED ORDER — METFORMIN HCL 500 MG PO TABS: 500.0000 mg | ORAL_TABLET | Freq: Two times a day (BID) | ORAL | 0 refills | Status: DC

## 2018-07-10 NOTE — Progress Notes (Signed)
Office: [phone]  /  Fax: [phone] TeleHealth Visit:  Laura [name] has verbally consented to this TeleHealth visit today. The patient is located at home, the provider is located at the [facility] office. The participants in this visit include the listed provider and patient. The visit was conducted today via Face Time.  HPI:   Chief Complaint: OBESITY Laura [name] is here to discuss her progress with her obesity treatment plan. She is on the Category 2 plan and is following her eating plan approximately 50 % of the time. She states she is riding a stationary bike 30 minutes 3 times per week. Laura [name] feels that she is doing well maintaining her weight. She does well on her plan during the week, but struggles a lot more in the evening.  We were unable to weigh the patient today for this TeleHealth visit. She feels as if she has maintained weight since her last visit. She has lost 1 lb since starting treatment with [place].  Vitamin D Deficiency Laura [name] has a diagnosis of vitamin D deficiency. She is currently on vit D, and is almost at goal. Laura [name] admits that fatigue is improving and denies nausea, vomiting, or muscle weakness.  Pre-Diabetes Laura [name] has a diagnosis of pre-diabetes based on her elevated Hgb A1c and was informed this puts her at greater risk of developing diabetes. She notes increased afternoon polyphagia still even on metformin. She notes an increase in simple carb snacks on weekends and at night. She is taking metformin currently and continues to work on diet and exercise to decrease risk of diabetes. She denies nausea, vomiting, or hypoglycemia.   ASSESSMENT AND PLAN:  Vitamin D deficiency - Plan: Vitamin D, Ergocalciferol, (DRISDOL) 1.25 MG (50000 UT) CAPS capsule  Prediabetes - Plan: metFORMIN (GLUCOPHAGE) 500 MG tablet  Class 3 severe obesity with serious comorbidity and body mass index (BMI) of 40.0 to 44.9 in adult, unspecified obesity type ([place])  PLAN:  Vitamin D  Deficiency Laura [name] was informed that low vitamin D levels contribute to fatigue and are associated with obesity, breast, and colon cancer. Laura [name] agrees to continue to take prescription Vit D @50 ,000 IU every week #4 with no refills and will follow up for routine testing of vitamin D, at least 2-3 times per year. She was informed of the risk of over-replacement of vitamin D and agrees to not increase her dose unless she discusses this with [place] first. Laura [name] agrees to follow up in 2 weeks as directed.  Pre-Diabetes Laura [name] will continue to work on weight loss, exercise, and decreasing simple carbohydrates in her diet to help decrease the risk of diabetes. She was informed that eating too many simple carbohydrates or too many calories at one sitting increases the likelihood of GI side effects. Laura [name] agreed to increase metformin 500 mg BID #60 with no refills and a prescription was written today. Laura [name] agreed to follow up with [place] as directed to monitor her progress in 2 weeks.   Obesity Laura [name] is currently in the action stage of change. As such, her goal is to continue with weight loss efforts. She has agreed to follow the Category 2 plan. Laura [name] has been instructed to work up to a goal of 150 minutes of combined cardio and strengthening exercise per week for weight loss and overall health benefits. We discussed the following Behavioral Modification Strategies today: increasing lean protein intake, decreasing simple carbohydrates, and work on meal planning and easy cooking plans.  Laura [name] has agreed to follow up  with our clinic in 2 weeks. She was informed of the importance of frequent follow up visits to maximize her success with intensive lifestyle modifications for her multiple health conditions.  ALLERGIES: No Known Allergies  MEDICATIONS: Current Outpatient Medications on File Prior to Visit  Medication Sig Dispense Refill  . lisinopril-hydrochlorothiazide (PRINZIDE,ZESTORETIC) 20-25 MG tablet TAKE 1 TABLET BY  MOUTH ONCE DAILY FOR 90 DAYS  1  . rivaroxaban (XARELTO) 10 MG TABS tablet Take 1 tablet (10 mg total) by mouth daily. 90 tablet 3   No current facility-administered medications on file prior to visit.     PAST MEDICAL HISTORY: Past Medical History:  Diagnosis Date  . Anemia, normocytic normochromic 07/04/2017   Chronic Hb 10 +/- 0.5 gms  . Edema   . 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 Herpes   . High blood pressure   . History of blood clots   . Obesity   . Partial tear of Achilles tendon   . Pneumonia   . 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 Pulmonary embolism ([place])   . 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 Vitamin D deficiency     PAST SURGICAL HISTORY: Past Surgical History:  Procedure Laterality Date  . BREAST BIOPSY    . [place]  . 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 Smoking status: Never Smoker  . Smokeless tobacco: Never Used  Substance Use Topics  . Alcohol use: Yes    Alcohol/week: 0.0 standard drinks    Comment: Rarely.  . Drug use: No    FAMILY HISTORY: Family History  Problem Relation Age of Onset  . Hypertension Mother   . Heart failure Mother   . Cancer Mother   . Depression Mother   . Sleep apnea Mother   . Obesity Mother   . Prostate cancer Father   . Multiple myeloma Father   . Heart failure Father   . Obesity Father   . Sleep apnea Father   . Kidney disease Father   . Sudden Cardiac Death Father   . Hypertension Father   . Heart attack Neg Hx   . Hyperlipidemia Neg Hx   . Diabetes Neg Hx   . Sudden death Neg Hx   . Breast cancer Neg Hx     ROS: Review of Systems  Constitutional:  Positive for malaise/fatigue.  Gastrointestinal: Negative for nausea and vomiting.  Musculoskeletal:       Negative for muscle weakness.  Endo/Heme/Allergies:       Negative for hypoglycemia.    PHYSICAL EXAM: Pt in no acute distress  RECENT LABS AND TESTS: BMET    Component Value Date/Time   NA 136 06/21/2018 1121   K 4.5 06/21/2018 1121   CL 96 06/21/2018 1121   CO2 24 06/21/2018 1121   GLUCOSE 81 06/21/2018 1121   GLUCOSE 129 (H) 09/07/2016 0437   BUN 20 06/21/2018 1121   CREATININE 0.88 06/21/2018 1121   CREATININE 0.83 05/13/2014 1158   CALCIUM 9.7 06/21/2018 1121   GFRNONAA 72 06/21/2018 1121   GFRAA 83 06/21/2018 1121   Lab Results  Component  Value Date   HGBA1C 5.9 (H) 06/21/2018   HGBA1C 5.8 (H) 01/17/2018   Lab Results  Component Value Date   INSULIN 13.9 06/21/2018   INSULIN 8.4 01/17/2018   CBC    Component Value Date/Time   WBC 8.6 01/17/2018 1222   WBC 13.8 (H) 09/08/2016 0409   RBC 4.19 01/17/2018 1222   RBC 3.63 (L) 09/08/2016 0409   HGB 12.1 01/17/2018 1222   HGB 12.8 05/29/2012 1502   HCT 37.4 01/17/2018 1222   HCT 40.0 05/29/2012 1502   PLT 289 07/04/2017 0938   MCV 89 01/17/2018 1222   MCV 90.3 05/29/2012 1502   MCH 28.9 01/17/2018 1222   MCH 28.4 09/08/2016 0409   MCHC 32.4 01/17/2018 1222   MCHC 30.7 09/08/2016 0409   RDW 13.0 01/17/2018 1222   RDW 14.9 (H) 05/29/2012 1502   LYMPHSABS 2.4 01/17/2018 1222   LYMPHSABS 2.9 05/29/2012 1502   MONOABS 0.7 05/13/2014 1158   MONOABS 0.7 05/29/2012 1502   EOSABS 0.3 01/17/2018 1222   BASOSABS 0.0 01/17/2018 1222   BASOSABS 0.0 05/29/2012 1502   Iron/TIBC/Ferritin/ %Sat    Component Value Date/Time   IRON 41 (L) 11/20/2008 1222   TIBC 319 11/20/2008 1222   FERRITIN 29 11/20/2008 1222   IRONPCTSAT 13 (L) 11/20/2008 1222   Lipid Panel     Component Value Date/Time   CHOL 180 06/21/2018 1121   TRIG 53 06/21/2018 1121   HDL 68 06/21/2018 1121   LDLCALC 101 (H) 06/21/2018 1121    Hepatic Function Panel     Component Value Date/Time   PROT 7.7 06/21/2018 1121   ALBUMIN 3.9 06/21/2018 1121   AST 17 06/21/2018 1121   ALT 11 06/21/2018 1121   ALKPHOS 71 06/21/2018 1121   BILITOT 0.4 06/21/2018 1121   BILIDIR 0.1 02/18/2008 1458   IBILI 0.3 02/18/2008 1458      Component Value Date/Time   TSH 1.190 01/17/2018 1222    Results for SKARLETH, DELMONICO (MRN [id number]) as of 07/10/2018 07:59  Ref. Range 06/21/2018 11:21  Vitamin D, 25-Hydroxy Latest Ref Range: 30.0 - 100.0 ng/mL 49.9    I, Marcille Blanco, CMA, am acting as transcriptionist for Starlyn Skeans, MD I have reviewed the above documentation for accuracy and completeness, and I agree with the above. -Dennard Nip, MD

## 2018-07-12 ENCOUNTER — Inpatient Hospital Stay: Payer: BC Managed Care – PPO

## 2018-07-12 ENCOUNTER — Inpatient Hospital Stay: Payer: BC Managed Care – PPO | Attending: Hematology | Admitting: Hematology

## 2018-07-16 NOTE — Addendum Note (Signed)
Addended by: Neomia Dear on: 07/16/2018 06:02 PM   Modules accepted: Orders

## 2018-07-24 ENCOUNTER — Other Ambulatory Visit: Payer: Self-pay

## 2018-07-24 ENCOUNTER — Encounter (INDEPENDENT_AMBULATORY_CARE_PROVIDER_SITE_OTHER): Payer: Self-pay | Admitting: Family Medicine

## 2018-07-24 ENCOUNTER — Ambulatory Visit (INDEPENDENT_AMBULATORY_CARE_PROVIDER_SITE_OTHER): Payer: BC Managed Care – PPO | Admitting: Family Medicine

## 2018-07-24 DIAGNOSIS — R7303 Prediabetes: Secondary | ICD-10-CM

## 2018-07-24 DIAGNOSIS — Z6841 Body Mass Index (BMI) 40.0 and over, adult: Secondary | ICD-10-CM | POA: Diagnosis not present

## 2018-07-25 ENCOUNTER — Other Ambulatory Visit: Payer: Self-pay

## 2018-07-25 ENCOUNTER — Ambulatory Visit (INDEPENDENT_AMBULATORY_CARE_PROVIDER_SITE_OTHER): Payer: BC Managed Care – PPO | Admitting: Family Medicine

## 2018-07-25 ENCOUNTER — Encounter: Payer: Self-pay | Admitting: Family Medicine

## 2018-07-25 VITALS — BP 140/92 | HR 85 | Ht 67.0 in | Wt 260.0 lb

## 2018-07-25 DIAGNOSIS — M1711 Unilateral primary osteoarthritis, right knee: Secondary | ICD-10-CM

## 2018-07-25 MED ORDER — METHYLPREDNISOLONE ACETATE 40 MG/ML IJ SUSP
40.0000 mg | Freq: Once | INTRAMUSCULAR | Status: AC
  Administered 2018-07-25: 40 mg via INTRA_ARTICULAR

## 2018-07-25 NOTE — Progress Notes (Signed)
. Patient ID: Laura [name], female   DOB: [birth year], 63 y.o.   MRN: [id number]  Subjective:   PCP: Dr. Dema Severin  Knee Pain    63 yo F here for right knee pain  02/29/12: Patient has known history of mod-severe DJD of bilateral knees. Last cortisone injections 3 months ago - started wearing off recently and going on a trip to [place] Has h/o PEs, on anticoagulation with coumadin - last INR in past week was 2.0. Pain currently worse in right knee than left - more lateral than medial but worse medially on left knee.  09/10/13: Patient returns today for bilateral knee injections. Tried synvisc twice since last visit here. First set helped for 3 months - second one did not help. At least a few months relief with cortisone shots in past. No new injuries.  01/15/14: Patient reports her knees started bothering her a couple weeks ago. Did well with cortisone shots. Taking tylenol.  04/17/14: Patient returns for bilateral knee injections for DJD. Did well since last visit and just recently they started bothering her again. Left knee 6/10 pain, right 4/10. No catching, locking, giving out.  4/18: Patient returns for repeat injections - both knees 3/10 level. She's also interested in viscosupplementation - working on getting approval Jacklyn Shell, our usual, is not approved by her insurance).  10/17: Patient returns for bilateral knee injections. Not much benefit last visit from cortisone injections - she did supartz by orthopedics and also not a whole lot of benefit. Constant dull ache in both knees 4/10 right, 6/10 left. Pain anterior, more medial. No catching, locking, giving out. Still bothered by her achilles on right as well - no new injuries. Doing some of the exercises. Has heel lifts. No skin changes, fever, other complaints.  04/08/15: Patient returns as pain started to recur past couple weeks in both knees. Pain level 0/10 at rest, up to 4/10 on left, 2/10 on  right especially with walking. Pain is sharp, anteromedial. Worse when getting up from seated position also. No skin changes, fever, other complaints.  4/26: Patient returns for bilateral knee injections. Pain level 5/10 bilaterally now. Worse with walking. No skin changes, numbness.  7/18: Patient returns with 3/10 pain in both knees. Worse first thing in morning, when getting up from prolonged sitting. Pain is dull. No skin changes, numbness. She would like bilateral knee injections.  12/25/15: Patient reports her knee pain has worsened. Pain right knee 4/10, left 6/10 anterior, dull. Worse with walking. No pain at rest. Would like to try viscosupplementation if cortisone injections don't help as much this time. No skin changes, numbness.  07/15/16: Patient returns with pain in both knees - up to 6/10 on left, 3/10 on right and sharp, anterior. Pain is 0/10 at rest bilaterally. Worse with ambulation. No skin changes, numbness.  06/14/17: Patient is doing very well from her knee replacement of left knee. Right knee has started worsening over last few weeks. Pain level 5/10, worse with walking. No skin changes, numbness.  7/12: Patient reports her right knee started flaring up about 1 month ago. Worse with walking. Pain currently 0/10 at rest. No skin changes,numbness  10/21: Patient returns for right knee pain. 0/10 at rest, 6/10 with activity. She got about 3 months of relief from previous steroid injection. No swelling or erythema. No locking or giving out. No skin changes  04/12/18: Patient reports her pain came back anterior right knee about a month ago. Difficulty walking due  to pain - feels like can't straighten knee out all the way now. No skin changes.  5/13: Patient reports she's overall doing well but pain in right knee anteriorly is 5/10 level, sharp. No skin changes.  Past Medical History:  Diagnosis Date  . Anemia, normocytic normochromic 07/04/2017    Chronic Hb 10 +/- 0.5 gms  . Edema   . 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 Herpes   . High blood pressure   . History of blood clots   . Obesity   . Partial tear of Achilles tendon   . Pneumonia   . 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 Pulmonary embolism ([place])   . 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 Vitamin D deficiency     Current Outpatient Medications on File Prior to Visit  Medication Sig Dispense Refill  . lisinopril-hydrochlorothiazide (PRINZIDE,ZESTORETIC) 20-25 MG tablet TAKE 1 TABLET BY MOUTH ONCE DAILY FOR 90 DAYS  1  . metFORMIN (GLUCOPHAGE) 500 MG tablet Take 1 tablet (500 mg total) by mouth 2 (two) times daily with a meal. 60 tablet 0  . rivaroxaban (XARELTO) 10 MG TABS tablet Take 1 tablet (10 mg total) by mouth daily. 90 tablet 3  . Vitamin D, Ergocalciferol, (DRISDOL) 1.25 MG (50000 UT) CAPS capsule Take 1 capsule (50,000 Units total) by mouth every 7 (seven) days. 4 capsule 0   No current facility-administered medications on file prior to visit.     Past Surgical History:  Procedure Laterality Date  . BREAST BIOPSY    . [place]  . 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 Marital status: Married    Spouse name: Terrina Docter  . Number of children: 2  . Years of education: Not on file  . Highest education level: Not on file  Occupational History  . Occupation: Engineer, building services: [employer]  . Financial resource strain: Not on file  . Food insecurity:    Worry: Not on file    Inability: Not on file  . Transportation needs:    Medical: Not on file    Non-medical:  Not on file  Tobacco Use  . Smoking status: Never Smoker  . Smokeless tobacco: Never Used  Substance and Sexual Activity  . Alcohol use: Yes    Alcohol/week: 0.0 standard drinks    Comment: Rarely.  . Drug use: No  . Sexual activity: Not on file  Lifestyle  . Physical activity:    Days per week: Not on file    Minutes per session: Not on file  . Stress: Not on file  Relationships  . Social connections:    Talks on phone: Not on file    Gets together: Not on file    Attends religious service: Not on file    Active member of club or organization: Not on file    Attends meetings of clubs or organizations: Not on file    Relationship status: Not on file  . Intimate partner violence:    Fear of current or ex partner: Not on file  Emotionally abused: Not on file    Physically abused: Not on file    Forced sexual activity: Not on file  Other Topics Concern  . Not on file  Social History Narrative  . Not on file    Family History  Problem Relation Age of Onset  . Hypertension Mother   . Heart failure Mother   . Cancer Mother   . Depression Mother   . Sleep apnea Mother   . Obesity Mother   . Prostate cancer Father   . Multiple myeloma Father   . Heart failure Father   . Obesity Father   . Sleep apnea Father   . Kidney disease Father   . Sudden Cardiac Death Father   . Hypertension Father   . Heart attack Neg Hx   . Hyperlipidemia Neg Hx   . Diabetes Neg Hx   . Sudden death Neg Hx   . Breast cancer Neg Hx     BP (!) 140/92   Pulse 85   Ht '5\' 7"'$  (1.702 m)   Wt 260 lb (117.9 kg)   BMI 40.72 kg/m   Review of Systems See HPI above.    Objective:   Physical Exam  Gen: NAD, comfortable in exam room Rest of exam not repeated today.  Right knee: No obvious deformity or swelling Mild medial joint line TTP Full ROM with 5/5 strength  NV intact    Assessment & Plan:  1. Right knee pain secondary to known OA - repeated steroid injection today.  She will  f/u with orthopedics to discuss replacement of right knee - planning to wait until this summer to have this done.  After informed written consent timeout was performed, patient was seated on exam table. Right knee was prepped with alcohol swab and utilizing anterolateral approach, patient's right knee was injected intraarticularly with 3:1 bupivicaine: depomedrol. Patient tolerated the procedure well without immediate complications.

## 2018-07-25 NOTE — Progress Notes (Signed)
Office: [phone]  /  Fax: [phone] TeleHealth Visit:  Laura [name] has verbally consented to this TeleHealth visit today. The patient is located at home, the provider is located at the [facility] office. The participants in this visit include the listed provider and patient. The visit was conducted today via Face Time.  HPI:   Chief Complaint: OBESITY Laura [name] is here to discuss her progress with her obesity treatment plan. She is on the Category 2 plan and is following her eating plan approximately 75 to 80 % of the time. She states she is exercising 0 minutes 0 times per week. Laura [name] feels that she is doing well with her eating plan and thinks that she has lost about 4 pounds since our last visit. She feels her hunger is controlled. Laura [name] is deviating from dinner more and would like to look at more variety.  We were unable to weigh the patient today for this TeleHealth visit. She feels as if she has lost weight since her last visit. She has lost 1 lb since starting treatment with [place].  Pre-Diabetes Laura [name] has a diagnosis of pre-diabetes based on her elevated Hgb A1c and was informed this puts her at greater risk of developing diabetes. She started taking metformin on her last visit and notes decreased polyphagia. Laura [name] has also done better with her diet prescription to decrease risk of diabetes. She denies nausea, vomiting, or hypoglycemia.   ASSESSMENT AND PLAN:  Prediabetes  Class 3 severe obesity with serious comorbidity and body mass index (BMI) of 40.0 to 44.9 in adult, unspecified obesity type Laura [name])  PLAN:  Pre-Diabetes Laura [name] will continue to work on weight loss, exercise, and decreasing simple carbohydrates in her diet to help decrease the risk of diabetes. She was informed that eating too many simple carbohydrates or too many calories at one sitting increases the likelihood of GI side effects. Amarria agreed to continue her diet and taking metformin 500 mg and there was  not a prescription written today. Laura [name] agreed to follow up with [place] as directed to monitor her progress in 2 weeks.   Laura [name] spent > than 50% of the 25 minute visit on counseling as documented in the note.  Obesity Laura [name] is currently in the action stage of change. As such, her goal is to continue with weight loss efforts. She has agreed to follow the Category 2 plan and keep a food journal with 400 to 550 calories and 35+ grams of protein for supper. Laura [name] has been instructed to work up to a goal of 150 minutes of combined cardio and strengthening exercise per week for weight loss and overall health benefits. We discussed the following Behavioral Modification Strategies today: increasing lean protein intake, work on meal planning and easy cooking plans, emotional eating strategies, keep a strict food journal, and decrease liquid calories.  Laura [name] has agreed to follow up with our clinic in 2 weeks. She was informed of the importance of frequent follow up visits to maximize her success with intensive lifestyle modifications for her multiple health conditions.  ALLERGIES: No Known Allergies  MEDICATIONS: Current Outpatient Medications on File Prior to Visit  Medication Sig Dispense Refill  . lisinopril-hydrochlorothiazide (PRINZIDE,ZESTORETIC) 20-25 MG tablet TAKE 1 TABLET BY MOUTH ONCE DAILY FOR 90 DAYS  1  . metFORMIN (GLUCOPHAGE) 500 MG tablet Take 1 tablet (500 mg total) by mouth 2 (two) times daily with a meal. 60 tablet 0  . rivaroxaban (XARELTO) 10 MG TABS tablet Take 1 tablet (  10 mg total) by mouth daily. 90 tablet 3  . Vitamin D, Ergocalciferol, (DRISDOL) 1.25 MG (50000 UT) CAPS capsule Take 1 capsule (50,000 Units total) by mouth every 7 (seven) days. 4 capsule 0   No current facility-administered medications on file prior to visit.     PAST MEDICAL HISTORY: Past Medical History:  Diagnosis Date  . Anemia, normocytic normochromic 07/04/2017   Chronic Hb 10 +/- 0.5 gms  . Edema   .  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 Herpes   . High blood pressure   . History of blood clots   . Obesity   . Partial tear of Achilles tendon   . Pneumonia   . 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 Pulmonary embolism ([place])   . 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 Vitamin D deficiency     PAST SURGICAL HISTORY: Past Surgical History:  Procedure Laterality Date  . BREAST BIOPSY    . [place]  . 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 Smoking status: Never Smoker  . Smokeless tobacco: Never Used  Substance Use Topics  . Alcohol use: Yes    Alcohol/week: 0.0 standard drinks    Comment: Rarely.  . Drug use: No    FAMILY HISTORY: Family History  Problem Relation Age of Onset  . Hypertension Mother   . Heart failure Mother   . Cancer Mother   . Depression Mother   . Sleep apnea Mother   . Obesity Mother   . Prostate cancer Father   . Multiple myeloma Father   . Heart failure Father   . Obesity Father   . Sleep apnea Father   . Kidney disease Father   . Sudden Cardiac Death Father   . Hypertension Father   . Heart attack Neg Hx   . Hyperlipidemia Neg Hx   . Diabetes Neg Hx   . Sudden death Neg Hx   . Breast cancer Neg Hx     ROS: Review of Systems  Gastrointestinal: Negative for nausea and vomiting.  Endo/Heme/Allergies:       Negative for hypoglycemia.    PHYSICAL EXAM: Pt in no acute distress  RECENT LABS AND TESTS: BMET    Component Value Date/Time   NA 136 06/21/2018 1121   K 4.5 06/21/2018 1121   CL 96 06/21/2018 1121   CO2 24 06/21/2018 1121   GLUCOSE 81 06/21/2018 1121   GLUCOSE 129 (H) 09/07/2016  0437   BUN 20 06/21/2018 1121   CREATININE 0.88 06/21/2018 1121   CREATININE 0.83 05/13/2014 1158   CALCIUM 9.7 06/21/2018 1121   GFRNONAA 72 06/21/2018 1121   GFRAA 83 06/21/2018 1121   Lab Results  Component Value Date   HGBA1C 5.9 (H) 06/21/2018   HGBA1C 5.8 (H) 01/17/2018   Lab Results  Component Value Date   INSULIN 13.9 06/21/2018   INSULIN 8.4 01/17/2018   CBC    Component Value Date/Time   WBC 8.6 01/17/2018 1222   WBC 13.8 (H) 09/08/2016 0409   RBC 4.19 01/17/2018 1222   RBC 3.63 (L) 09/08/2016 0409   HGB  12.1 01/17/2018 1222   HGB 12.8 05/29/2012 1502   HCT 37.4 01/17/2018 1222   HCT 40.0 05/29/2012 1502   PLT 289 07/04/2017 0938   MCV 89 01/17/2018 1222   MCV 90.3 05/29/2012 1502   MCH 28.9 01/17/2018 1222   MCH 28.4 09/08/2016 0409   MCHC 32.4 01/17/2018 1222   MCHC 30.7 09/08/2016 0409   RDW 13.0 01/17/2018 1222   RDW 14.9 (H) 05/29/2012 1502   LYMPHSABS 2.4 01/17/2018 1222   LYMPHSABS 2.9 05/29/2012 1502   MONOABS 0.7 05/13/2014 1158   MONOABS 0.7 05/29/2012 1502   EOSABS 0.3 01/17/2018 1222   BASOSABS 0.0 01/17/2018 1222   BASOSABS 0.0 05/29/2012 1502   Iron/TIBC/Ferritin/ %Sat    Component Value Date/Time   IRON 41 (L) 11/20/2008 1222   TIBC 319 11/20/2008 1222   FERRITIN 29 11/20/2008 1222   IRONPCTSAT 13 (L) 11/20/2008 1222   Lipid Panel     Component Value Date/Time   CHOL 180 06/21/2018 1121   TRIG 53 06/21/2018 1121   HDL 68 06/21/2018 1121   LDLCALC 101 (H) 06/21/2018 1121   Hepatic Function Panel     Component Value Date/Time   PROT 7.7 06/21/2018 1121   ALBUMIN 3.9 06/21/2018 1121   AST 17 06/21/2018 1121   ALT 11 06/21/2018 1121   ALKPHOS 71 06/21/2018 1121   BILITOT 0.4 06/21/2018 1121   BILIDIR 0.1 02/18/2008 1458   IBILI 0.3 02/18/2008 1458      Component Value Date/Time   TSH 1.190 01/17/2018 1222   Results for KUUIPO, ANZALDO (MRN [id number]) as of 07/25/2018 12:42  Ref. Range 06/21/2018 11:21  Vitamin D,  25-Hydroxy Latest Ref Range: 30.0 - 100.0 ng/mL 49.9     Laura [name], Laura [name], CMA, am acting as transcriptionist for Starlyn Skeans, MD Laura [name] have reviewed the above documentation for accuracy and completeness, and Laura [name] agree with the above. -Dennard Nip, MD

## 2018-08-09 ENCOUNTER — Ambulatory Visit (INDEPENDENT_AMBULATORY_CARE_PROVIDER_SITE_OTHER): Payer: BC Managed Care – PPO | Admitting: Family Medicine

## 2018-08-09 ENCOUNTER — Encounter (INDEPENDENT_AMBULATORY_CARE_PROVIDER_SITE_OTHER): Payer: Self-pay | Admitting: Family Medicine

## 2018-08-09 ENCOUNTER — Other Ambulatory Visit: Payer: Self-pay

## 2018-08-09 DIAGNOSIS — E559 Vitamin D deficiency, unspecified: Secondary | ICD-10-CM

## 2018-08-09 DIAGNOSIS — R7303 Prediabetes: Secondary | ICD-10-CM | POA: Diagnosis not present

## 2018-08-09 DIAGNOSIS — Z6841 Body Mass Index (BMI) 40.0 and over, adult: Secondary | ICD-10-CM

## 2018-08-09 MED ORDER — VITAMIN D (ERGOCALCIFEROL) 1.25 MG (50000 UNIT) PO CAPS: 50000.0000 [IU] | ORAL_CAPSULE | ORAL | 0 refills | Status: DC

## 2018-08-09 MED ORDER — METFORMIN HCL 500 MG PO TABS: 500.0000 mg | ORAL_TABLET | Freq: Two times a day (BID) | ORAL | 0 refills | Status: DC

## 2018-08-09 NOTE — Progress Notes (Signed)
Office: [phone]  /  Fax: [phone] TeleHealth Visit:  Lourie Retz has verbally consented to this TeleHealth visit today. The patient is located at home, the provider is located at the [employer] [facility] office. The participants in this visit include the listed provider and patient. The visit was conducted today via Face Time.  HPI:   Chief Complaint: OBESITY Laura [name] is here to discuss her progress with her obesity treatment plan. She is on the Category 2 plan and is following her eating plan approximately 60 % of the time. She states she is riding her bike 30 minutes 4 times per week. Aunisty continues to do well maintaining her weight during [place] isolation. She has been deviating from her plan more the last few weeks, but states that she is ready to get back on track.  We were unable to weigh the patient today for this TeleHealth visit. She feels as if she has maintained weight since her last visit. She has lost 1 lb since starting treatment with [place].  Pre-Diabetes Rudy has a diagnosis of pre-diabetes based on her elevated Hgb A1c and was informed this puts her at greater risk of developing diabetes. She is stable on metformin currently, but notes some loose stools, especially when she deviates from her plan. She continues to work on diet and exercise to decrease risk of diabetes. She denies nausea, vomiting, or hypoglycemia.   Vitamin D Deficiency Chaselynn has a diagnosis of vitamin D deficiency. She is currently on vit D and is now at goal. Haliegh denies nausea, vomiting, or muscle weakness.  ASSESSMENT AND PLAN:  Prediabetes - Plan: metFORMIN (GLUCOPHAGE) 500 MG tablet  Vitamin D deficiency - Plan: Vitamin D, Ergocalciferol, (DRISDOL) 1.25 MG (50000 UT) CAPS capsule  Class 3 severe obesity with serious comorbidity and body mass index (BMI) of 40.0 to 44.9 in adult, unspecified obesity type [facility])  PLAN:  Pre-Diabetes Kenzie will continue to work on weight loss, exercise, and  decreasing simple carbohydrates in her diet to help decrease the risk of diabetes. She was informed that eating too many simple carbohydrates or too many calories at one sitting increases the likelihood of GI side effects. Makenzy agreed to continue metformin 500 mg BID #60 with no refills and a prescription was written today. Pennie agreed to get back to her diet and to follow up with [place] as directed to monitor her progress in 2 weeks.   Vitamin D Deficiency Sondi was informed that low vitamin D levels contribute to fatigue and are associated with obesity, breast, and colon cancer. Jameisha agrees to continue to take prescription Vit D @50 ,000 IU every week #4 with no refills and will follow up for routine testing of vitamin D, at least 2-3 times per year. She was informed of the risk of over-replacement of vitamin D and agrees to not increase her dose unless she discusses this with [place] first. Brittinee agrees to follow up in 2 weeks as directed.  Obesity Shonique is currently in the action stage of change. As such, her goal is to continue with weight loss efforts. She has agreed to follow the Category 2 plan. Glennis has been instructed to work up to a goal of 150 minutes of combined cardio and strengthening exercise per week for weight loss and overall health benefits. We discussed the following Behavioral Modification Strategies today: increasing lean protein intake, decreasing simple carbohydrates, and work on meal planning and easy cooking plans.  Jashley has agreed to follow up with our  clinic in 2 weeks. She was informed of the importance of frequent follow up visits to maximize her success with intensive lifestyle modifications for her multiple health conditions.  ALLERGIES: No Known Allergies  MEDICATIONS: Current Outpatient Medications on File Prior to Visit  Medication Sig Dispense Refill  . lisinopril-hydrochlorothiazide (PRINZIDE,ZESTORETIC) 20-25 MG tablet TAKE 1 TABLET BY MOUTH ONCE DAILY FOR 90 DAYS  1  .  rivaroxaban (XARELTO) 10 MG TABS tablet Take 1 tablet (10 mg total) by mouth daily. 90 tablet 3   No current facility-administered medications on file prior to visit.     PAST MEDICAL HISTORY: Past Medical History:  Diagnosis Date  . Anemia, normocytic normochromic 07/04/2017   Chronic Hb 10 +/- 0.5 gms  . Edema   . 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 Herpes   . High blood pressure   . History of blood clots   . Obesity   . Partial tear of Achilles tendon   . Pneumonia   . 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 Pulmonary embolism ([place])   . 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 Vitamin D deficiency     PAST SURGICAL HISTORY: Past Surgical History:  Procedure Laterality Date  . BREAST BIOPSY    . [place]  . 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 Smoking status: Never Smoker  . Smokeless tobacco: Never Used  Substance Use Topics  . Alcohol use: Yes    Alcohol/week: 0.0 standard drinks    Comment: Rarely.  . Drug use: No    FAMILY HISTORY: Family History  Problem Relation Age of Onset  . Hypertension Mother   . Heart failure Mother   . Cancer Mother   . Depression Mother   . Sleep apnea Mother   . Obesity Mother   . Prostate cancer Father   . Multiple myeloma Father   . Heart failure Father   . Obesity Father   . Sleep apnea Father   . Kidney disease Father   . Sudden Cardiac Death Father   . Hypertension Father   . Heart attack Neg Hx   . Hyperlipidemia Neg Hx   . Diabetes Neg Hx   . Sudden death Neg Hx   . Breast cancer Neg Hx     ROS: Review of Systems  Gastrointestinal: Negative for nausea and vomiting.   Musculoskeletal:       Negative for muscle weakness.  Endo/Heme/Allergies:       Negative for hypoglycemia.    PHYSICAL EXAM: Pt in no acute distress  RECENT LABS AND TESTS: BMET    Component Value Date/Time   NA 136 06/21/2018 1121   K 4.5 06/21/2018 1121   CL 96 06/21/2018 1121   CO2 24 06/21/2018 1121   GLUCOSE 81 06/21/2018 1121   GLUCOSE 129 (H) 09/07/2016 0437   BUN 20 06/21/2018 1121   CREATININE 0.88 06/21/2018 1121   CREATININE 0.83 05/13/2014 1158   CALCIUM 9.7 06/21/2018 1121   GFRNONAA 72 06/21/2018 1121   GFRAA 83 06/21/2018 1121   Lab Results  Component Value Date   HGBA1C 5.9 (H)  06/21/2018   HGBA1C 5.8 (H) 01/17/2018   Lab Results  Component Value Date   INSULIN 13.9 06/21/2018   INSULIN 8.4 01/17/2018   CBC    Component Value Date/Time   WBC 8.6 01/17/2018 1222   WBC 13.8 (H) 09/08/2016 0409   RBC 4.19 01/17/2018 1222   RBC 3.63 (L) 09/08/2016 0409   HGB 12.1 01/17/2018 1222   HGB 12.8 05/29/2012 1502   HCT 37.4 01/17/2018 1222   HCT 40.0 05/29/2012 1502   PLT 289 07/04/2017 0938   MCV 89 01/17/2018 1222   MCV 90.3 05/29/2012 1502   MCH 28.9 01/17/2018 1222   MCH 28.4 09/08/2016 0409   MCHC 32.4 01/17/2018 1222   MCHC 30.7 09/08/2016 0409   RDW 13.0 01/17/2018 1222   RDW 14.9 (H) 05/29/2012 1502   LYMPHSABS 2.4 01/17/2018 1222   LYMPHSABS 2.9 05/29/2012 1502   MONOABS 0.7 05/13/2014 1158   MONOABS 0.7 05/29/2012 1502   EOSABS 0.3 01/17/2018 1222   BASOSABS 0.0 01/17/2018 1222   BASOSABS 0.0 05/29/2012 1502   Iron/TIBC/Ferritin/ %Sat    Component Value Date/Time   IRON 41 (L) 11/20/2008 1222   TIBC 319 11/20/2008 1222   FERRITIN 29 11/20/2008 1222   IRONPCTSAT 13 (L) 11/20/2008 1222   Lipid Panel     Component Value Date/Time   CHOL 180 06/21/2018 1121   TRIG 53 06/21/2018 1121   HDL 68 06/21/2018 1121   LDLCALC 101 (H) 06/21/2018 1121   Hepatic Function Panel     Component Value Date/Time   PROT 7.7 06/21/2018 1121    ALBUMIN 3.9 06/21/2018 1121   AST 17 06/21/2018 1121   ALT 11 06/21/2018 1121   ALKPHOS 71 06/21/2018 1121   BILITOT 0.4 06/21/2018 1121   BILIDIR 0.1 02/18/2008 1458   IBILI 0.3 02/18/2008 1458      Component Value Date/Time   TSH 1.190 01/17/2018 1222   Results for CHANTRICE, HAGG (MRN [id number]) as of 08/09/2018 09:31  Ref. Range 06/21/2018 11:21  Vitamin D, 25-Hydroxy Latest Ref Range: 30.0 - 100.0 ng/mL 49.9     I, Marcille Blanco, CMA, am acting as transcriptionist for Starlyn Skeans, MD I have reviewed the above documentation for accuracy and completeness, and I agree with the above. -Dennard Nip, MD

## 2018-08-23 ENCOUNTER — Ambulatory Visit (INDEPENDENT_AMBULATORY_CARE_PROVIDER_SITE_OTHER): Payer: BC Managed Care – PPO | Admitting: Family Medicine

## 2018-08-29 ENCOUNTER — Encounter (INDEPENDENT_AMBULATORY_CARE_PROVIDER_SITE_OTHER): Payer: Self-pay

## 2018-08-30 ENCOUNTER — Other Ambulatory Visit: Payer: Self-pay

## 2018-08-30 ENCOUNTER — Ambulatory Visit (INDEPENDENT_AMBULATORY_CARE_PROVIDER_SITE_OTHER): Payer: BC Managed Care – PPO | Admitting: Family Medicine

## 2018-08-30 ENCOUNTER — Encounter (INDEPENDENT_AMBULATORY_CARE_PROVIDER_SITE_OTHER): Payer: Self-pay | Admitting: Family Medicine

## 2018-08-30 ENCOUNTER — Encounter (INDEPENDENT_AMBULATORY_CARE_PROVIDER_SITE_OTHER): Payer: Self-pay

## 2018-08-30 DIAGNOSIS — R7303 Prediabetes: Secondary | ICD-10-CM

## 2018-08-30 DIAGNOSIS — Z6841 Body Mass Index (BMI) 40.0 and over, adult: Secondary | ICD-10-CM | POA: Diagnosis not present

## 2018-08-30 DIAGNOSIS — E559 Vitamin D deficiency, unspecified: Secondary | ICD-10-CM

## 2018-08-30 MED ORDER — METFORMIN HCL 500 MG PO TABS: 500.0000 mg | ORAL_TABLET | Freq: Two times a day (BID) | ORAL | 0 refills | Status: DC

## 2018-08-30 MED ORDER — VITAMIN D (ERGOCALCIFEROL) 1.25 MG (50000 UNIT) PO CAPS: 50000.0000 [IU] | ORAL_CAPSULE | ORAL | 0 refills | Status: DC

## 2018-09-03 NOTE — Progress Notes (Signed)
Office: [phone]  /  Fax: [phone] TeleHealth Visit:  [name] has verbally consented to this TeleHealth visit today. The patient is located at home, the provider is located at the [facility] office. The participants in this visit include the listed provider and patient. The visit was conducted today via face time.  HPI:   Chief Complaint: OBESITY Laura [name] is here to discuss her progress with her obesity treatment plan. She is on the Category 2 plan and is following her eating plan approximately 60 % of the time. She states she is riding stationary bike for 30 minutes 2-3 times per week. Laura [name] is struggling to follow her plan closely, but she is frustrated that she isn't losing weight. She sometimes  Skips meals and has been using protein shakes instead which will not give her the same benefit as eating whole foods. She tried journaling in the past but wasn't able to journal strictly as required.  We were unable to weigh the patient today for this TeleHealth visit. She feels as if she has maintained her weight since her last visit. She has lost 1 lb since starting treatment with [place].  Vitamin D Deficiency Laura [name] has a diagnosis of vitamin D deficiency. She is stable on prescription Vit D, and last Vit D level is almost at goal. She denies nausea, vomiting or muscle weakness.  Pre-Diabetes Laura [name] has a diagnosis of pre-diabetes based on her elevated Hgb A1c and was informed this puts her at greater risk of developing diabetes. Last A1c was 5.9, she is working on diet and weight loss, and she is taking metformin. She denies nausea, vomiting, or hypoglycemia. She is struggling to take her 2nd dose and wonders if metformin is actually helping her.   ASSESSMENT AND PLAN:  Vitamin D deficiency - Plan: Vitamin D, Ergocalciferol, (DRISDOL) 1.25 MG (50000 UT) CAPS capsule  Prediabetes - Plan: metFORMIN (GLUCOPHAGE) 500 MG tablet  Class 3 severe obesity with serious comorbidity and  body mass index (BMI) of 40.0 to 44.9 in adult, unspecified obesity type ([place])  PLAN:  Vitamin D Deficiency [name] was informed that low vitamin D levels contributes to fatigue and are associated with obesity, breast, and colon cancer. [name] agrees to continue taking prescription Vit D _0 ,000 IU every week #4 and we will refill for 1 month. She will follow up for routine testing of vitamin D, at least 2-3 times per year. She was informed of the risk of over-replacement of vitamin D and agrees to not increase her dose unless she discusses this with [place] first. [name] agrees to follow up with our clinic in 3 weeks with Dr. Adair Patter.  Pre-Diabetes [name] will continue to work on weight loss, exercise, and decreasing simple carbohydrates in her diet to help decrease the risk of diabetes. We dicussed metformin including benefits and risks. She was informed that eating too many simple carbohydrates or too many calories at one sitting increases the likelihood of GI side effects. [name] agrees to continue taking metformin 500 mg BID #60 and we will refill for 1 month. She was advised to work on taking both doses to find out if it is helping or not. [name] agrees to follow up with our clinic in 3 weeks with Dr. Adair Patter as directed to monitor her progress.  Obesity Laura [name] is currently in the action stage of change. As such, her goal is to continue with weight loss efforts She has agreed to follow a lower carbohydrate, vegetable and lean protein rich  diet plan or follow the Category 2 plan We will send low carbohydrate meal plan via MyChart. Laura [name] has been instructed to work up to a goal of 150 minutes of combined cardio and strengthening exercise per week for weight loss and overall health benefits. We discussed the following Behavioral Modification Strategies today: no skipping meals, increasing lean protein intake, work on meal planning and easy cooking plans and decrease liquid calories   Laura [name] has agreed to follow up  with our clinic in 3 weeks with Dr. Adair Patter. She was informed of the importance of frequent follow up visits to maximize her success with intensive lifestyle modifications for her multiple health conditions.  ALLERGIES: No Known Allergies  MEDICATIONS: Current Outpatient Medications on File Prior to Visit  Medication Sig Dispense Refill  . lisinopril-hydrochlorothiazide (PRINZIDE,ZESTORETIC) 20-25 MG tablet TAKE 1 TABLET BY MOUTH ONCE DAILY FOR 90 DAYS  1  . rivaroxaban (XARELTO) 10 MG TABS tablet Take 1 tablet (10 mg total) by mouth daily. 90 tablet 3   No current facility-administered medications on file prior to visit.     PAST MEDICAL HISTORY: Past Medical History:  Diagnosis Date  . Anemia, normocytic normochromic 07/04/2017   Chronic Hb 10 +/- 0.5 gms  . Edema   . 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 Herpes   . High blood pressure   . History of blood clots   . Obesity   . Partial tear of Achilles tendon   . Pneumonia   . 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 Pulmonary embolism ([place])   . 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 Vitamin D deficiency     PAST SURGICAL HISTORY: Past Surgical History:  Procedure Laterality Date  . BREAST BIOPSY    . [place]  . 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 Smoking status: Never Smoker  . Smokeless tobacco: Never Used  Substance Use Topics  . Alcohol use: Yes    Alcohol/week: 0.0 standard drinks    Comment: Rarely.  . Drug use: No    FAMILY HISTORY: Family History  Problem Relation Age of Onset  . Hypertension Mother   . Heart failure Mother   . Cancer Mother   . Depression  Mother   . Sleep apnea Mother   . Obesity Mother   . Prostate cancer Father   . Multiple myeloma Father   . Heart failure Father   . Obesity Father   . Sleep apnea Father   . Kidney disease Father   . Sudden Cardiac Death Father   . Hypertension Father   . Heart attack Neg Hx   . Hyperlipidemia Neg Hx   . Diabetes Neg Hx   . Sudden death Neg Hx   . Breast cancer Neg Hx     ROS: Review of Systems  Constitutional: Negative for weight loss.  Gastrointestinal: Negative for nausea and vomiting.  Musculoskeletal:       Negative muscle weakness  Endo/Heme/Allergies:       Negative hypoglycemia    PHYSICAL EXAM: Pt in no acute distress  RECENT LABS AND TESTS: BMET  Component Value Date/Time   NA 136 06/21/2018 1121   K 4.5 06/21/2018 1121   CL 96 06/21/2018 1121   CO2 24 06/21/2018 1121   GLUCOSE 81 06/21/2018 1121   GLUCOSE 129 (H) 09/07/2016 0437   BUN 20 06/21/2018 1121   CREATININE 0.88 06/21/2018 1121   CREATININE 0.83 05/13/2014 1158   CALCIUM 9.7 06/21/2018 1121   GFRNONAA 72 06/21/2018 1121   GFRAA 83 06/21/2018 1121   Lab Results  Component Value Date   HGBA1C 5.9 (H) 06/21/2018   HGBA1C 5.8 (H) 01/17/2018   Lab Results  Component Value Date   INSULIN 13.9 06/21/2018   INSULIN 8.4 01/17/2018   CBC    Component Value Date/Time   WBC 8.6 01/17/2018 1222   WBC 13.8 (H) 09/08/2016 0409   RBC 4.19 01/17/2018 1222   RBC 3.63 (L) 09/08/2016 0409   HGB 12.1 01/17/2018 1222   HGB 12.8 05/29/2012 1502   HCT 37.4 01/17/2018 1222   HCT 40.0 05/29/2012 1502   PLT 289 07/04/2017 0938   MCV 89 01/17/2018 1222   MCV 90.3 05/29/2012 1502   MCH 28.9 01/17/2018 1222   MCH 28.4 09/08/2016 0409   MCHC 32.4 01/17/2018 1222   MCHC 30.7 09/08/2016 0409   RDW 13.0 01/17/2018 1222   RDW 14.9 (H) 05/29/2012 1502   LYMPHSABS 2.4 01/17/2018 1222   LYMPHSABS 2.9 05/29/2012 1502   MONOABS 0.7 05/13/2014 1158   MONOABS 0.7 05/29/2012 1502   EOSABS 0.3 01/17/2018  1222   BASOSABS 0.0 01/17/2018 1222   BASOSABS 0.0 05/29/2012 1502   Iron/TIBC/Ferritin/ %Sat    Component Value Date/Time   IRON 41 (L) 11/20/2008 1222   TIBC 319 11/20/2008 1222   FERRITIN 29 11/20/2008 1222   IRONPCTSAT 13 (L) 11/20/2008 1222   Lipid Panel     Component Value Date/Time   CHOL 180 06/21/2018 1121   TRIG 53 06/21/2018 1121   HDL 68 06/21/2018 1121   LDLCALC 101 (H) 06/21/2018 1121   Hepatic Function Panel     Component Value Date/Time   PROT 7.7 06/21/2018 1121   ALBUMIN 3.9 06/21/2018 1121   AST 17 06/21/2018 1121   ALT 11 06/21/2018 1121   ALKPHOS 71 06/21/2018 1121   BILITOT 0.4 06/21/2018 1121   BILIDIR 0.1 02/18/2008 1458   IBILI 0.3 02/18/2008 1458      Component Value Date/Time   TSH 1.190 01/17/2018 1222      I, Trixie Dredge, am acting as transcriptionist for Dennard Nip, MD I have reviewed the above documentation for accuracy and completeness, and I agree with the above. -Dennard Nip, MD

## 2018-09-18 ENCOUNTER — Ambulatory Visit (INDEPENDENT_AMBULATORY_CARE_PROVIDER_SITE_OTHER): Payer: BC Managed Care – PPO | Admitting: Family Medicine

## 2018-09-25 ENCOUNTER — Encounter (INDEPENDENT_AMBULATORY_CARE_PROVIDER_SITE_OTHER): Payer: Self-pay | Admitting: Family Medicine

## 2018-09-25 ENCOUNTER — Other Ambulatory Visit: Payer: Self-pay

## 2018-09-25 ENCOUNTER — Ambulatory Visit (INDEPENDENT_AMBULATORY_CARE_PROVIDER_SITE_OTHER): Payer: BC Managed Care – PPO | Admitting: Family Medicine

## 2018-09-25 VITALS — BP 99/61 | HR 85 | Temp 97.9°F | Ht 66.0 in | Wt 280.0 lb

## 2018-09-25 DIAGNOSIS — Z6841 Body Mass Index (BMI) 40.0 and over, adult: Secondary | ICD-10-CM

## 2018-09-25 DIAGNOSIS — Z9189 Other specified personal risk factors, not elsewhere classified: Secondary | ICD-10-CM | POA: Diagnosis not present

## 2018-09-25 DIAGNOSIS — E559 Vitamin D deficiency, unspecified: Secondary | ICD-10-CM | POA: Diagnosis not present

## 2018-09-25 DIAGNOSIS — R7303 Prediabetes: Secondary | ICD-10-CM | POA: Diagnosis not present

## 2018-09-25 MED ORDER — METFORMIN HCL 500 MG PO TABS: 500.0000 mg | ORAL_TABLET | Freq: Two times a day (BID) | ORAL | 0 refills | Status: DC

## 2018-09-25 MED ORDER — VITAMIN D (ERGOCALCIFEROL) 1.25 MG (50000 UNIT) PO CAPS: 50000.0000 [IU] | ORAL_CAPSULE | ORAL | 0 refills | Status: DC

## 2018-09-26 NOTE — Progress Notes (Signed)
Office: [phone]  /  Fax: [phone]   HPI:   Chief Complaint: OBESITY Laura [name] is here to discuss her progress with her obesity treatment plan. She is on the Category 2 plan and is following her eating plan approximately 20 % of the time. She states she is riding stationary bike for 30 minutes 2 times per week. Laura [name] has gained 3 lbs since her last in office visit >3 months ago. She is ready to get back on track with her eating plan, and she wants to restart Category 2 for now. Her weight is 280 lb (127 kg) today and has gained 3 lbs since her last visit. She has lost 0 lbs since starting treatment with [place].  Pre-Diabetes Laura [name] has a diagnosis of pre-diabetes based on her elevated Hgb A1c and was informed this puts her at greater risk of developing diabetes. She stopped her medications for 2 weeks when her mood was low, but she is ready to start again. She continues to work on diet and exercise to decrease risk of diabetes.   Vitamin D Deficiency Laura [name] has a diagnosis of vitamin D deficiency. She is stable on prescription Vit D and denies nausea, vomiting or muscle weakness.   At risk for diabetes Laura [name] is at higher than averagerisk for developing diabetes due to her obesity. She currently denies polyuria or polydipsia.  ASSESSMENT AND PLAN:  Prediabetes - Plan: metFORMIN (GLUCOPHAGE) 500 MG tablet  Vitamin D deficiency - Plan: Vitamin D, Ergocalciferol, (DRISDOL) 1.25 MG (50000 UT) CAPS capsule  Class 3 severe obesity with serious comorbidity and body mass index (BMI) of 45.0 to 49.9 in adult, unspecified obesity type [facility])  PLAN:  Pre-Diabetes Laura [name] will get back to her diet prescription and will continue to work on weight loss, exercise, and decreasing simple carbohydrates in her diet to help decrease the risk of diabetes. We dicussed metformin including benefits and risks. She was informed that eating too many simple carbohydrates or too many calories at one sitting increases the  likelihood of GI side effects. Laura [name] agrees to continue taking metformin 500 mg BID #60 and we will refill for 1 month. Laura [name] agrees to follow up with our clinic in 2 weeks with Abby Potash, PA-C as directed to monitor her progress.  Vitamin D Deficiency Laura [name] was informed that low vitamin D levels contributes to fatigue and are associated with obesity, breast, and colon cancer. Laura [name] agrees to continue taking prescription Vit D 50,000 IU every week #4 and we will refill for 1 month. She will follow up for routine testing of vitamin D, at least 2-3 times per year. She was informed of the risk of over-replacement of vitamin D and agrees to not increase her dose unless she discusses this with [place] first. Laura [name] agrees to follow up with our clinic in 2 weeks with Abby Potash, PA-C.  Diabetes risk counselling Laura [name] was given extended (15 minutes) diabetes prevention counseling today. She is 63 y.o. female and has risk factors for diabetes including obesity. We discussed intensive lifestyle modifications today with an emphasis on weight loss as well as increasing exercise and decreasing simple carbohydrates in her diet.  Obesity Laura [name] is currently in the action stage of change. As such, her goal is to continue with weight loss efforts She has agreed to follow the Category 2 plan Laura [name] has been instructed to work up to a goal of 150 minutes of combined cardio and strengthening exercise per week for weight loss and overall health benefits. We  discussed the following Behavioral Modification Strategies today: increasing lean protein intake, decreasing simple carbohydrates  and work on meal planning and easy cooking plans   Laura [name] has agreed to follow up with our clinic in 2 weeks with Abby Potash, PA-C. She was informed of the importance of frequent follow up visits to maximize her success with intensive lifestyle modifications for her multiple health conditions.  ALLERGIES: No Known Allergies  MEDICATIONS:  Current Outpatient Medications on File Prior to Visit  Medication Sig Dispense Refill  . lisinopril-hydrochlorothiazide (PRINZIDE,ZESTORETIC) 20-25 MG tablet TAKE 1 TABLET BY MOUTH ONCE DAILY FOR 90 DAYS  1  . rivaroxaban (XARELTO) 10 MG TABS tablet Take 1 tablet (10 mg total) by mouth daily. 90 tablet 3   No current facility-administered medications on file prior to visit.     PAST MEDICAL HISTORY: Past Medical History:  Diagnosis Date  . Anemia, normocytic normochromic 07/04/2017   Chronic Hb 10 +/- 0.5 gms  . Edema   . 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 Herpes   . High blood pressure   . History of blood clots   . Obesity   . Partial tear of Achilles tendon   . Pneumonia   . 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 Pulmonary embolism ([place])   . 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 Vitamin D deficiency     PAST SURGICAL HISTORY: Past Surgical History:  Procedure Laterality Date  . BREAST BIOPSY    . [place]  . 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 Smoking status: Never Smoker  . Smokeless tobacco: Never Used  Substance Use Topics  . Alcohol use: Yes    Alcohol/week: 0.0 standard drinks    Comment: Rarely.  . Drug use: No    FAMILY HISTORY: Family History  Problem Relation Age of Onset  . Hypertension Mother   . Heart failure Mother   . Cancer Mother   . Depression Mother   . Sleep apnea Mother   . Obesity Mother   . Prostate cancer Father   . Multiple myeloma Father   . Heart failure Father   . Obesity Father   . Sleep apnea Father   . Kidney disease Father   . Sudden Cardiac Death Father   . Hypertension  Father   . Heart attack Neg Hx   . Hyperlipidemia Neg Hx   . Diabetes Neg Hx   . Sudden death Neg Hx   . Breast cancer Neg Hx     ROS: Review of Systems  Constitutional: Negative for weight loss.  Gastrointestinal: Negative for nausea and vomiting.  Musculoskeletal:       Negative muscle weakness    PHYSICAL EXAM: Blood pressure 99/61, pulse 85, temperature 97.9 F (36.6 C), temperature source Oral, height 5' 6"  (1.676 m), weight 280 lb (127 kg), SpO2 97 %. Body mass index is 45.19 kg/m. Physical Exam Vitals signs reviewed.  Constitutional:      Appearance: Normal appearance. She is obese.  Cardiovascular:     Rate and Rhythm: Normal rate.     Pulses: Normal pulses.  Pulmonary:  Effort: Pulmonary effort is normal.     Breath sounds: Normal breath sounds.  Musculoskeletal: Normal range of motion.  Skin:    General: Skin is warm and dry.  Neurological:     Mental Status: She is alert and oriented to person, place, and time.  Psychiatric:        Mood and Affect: Mood normal.        Behavior: Behavior normal.     RECENT LABS AND TESTS: BMET    Component Value Date/Time   NA 136 06/21/2018 1121   K 4.5 06/21/2018 1121   CL 96 06/21/2018 1121   CO2 24 06/21/2018 1121   GLUCOSE 81 06/21/2018 1121   GLUCOSE 129 (H) 09/07/2016 0437   BUN 20 06/21/2018 1121   CREATININE 0.88 06/21/2018 1121   CREATININE 0.83 05/13/2014 1158   CALCIUM 9.7 06/21/2018 1121   GFRNONAA 72 06/21/2018 1121   GFRAA 83 06/21/2018 1121   Lab Results  Component Value Date   HGBA1C 5.9 (H) 06/21/2018   HGBA1C 5.8 (H) 01/17/2018   Lab Results  Component Value Date   INSULIN 13.9 06/21/2018   INSULIN 8.4 01/17/2018   CBC    Component Value Date/Time   WBC 8.6 01/17/2018 1222   WBC 13.8 (H) 09/08/2016 0409   RBC 4.19 01/17/2018 1222   RBC 3.63 (L) 09/08/2016 0409   HGB 12.1 01/17/2018 1222   HGB 12.8 05/29/2012 1502   HCT 37.4 01/17/2018 1222   HCT 40.0 05/29/2012 1502   PLT  289 07/04/2017 0938   MCV 89 01/17/2018 1222   MCV 90.3 05/29/2012 1502   MCH 28.9 01/17/2018 1222   MCH 28.4 09/08/2016 0409   MCHC 32.4 01/17/2018 1222   MCHC 30.7 09/08/2016 0409   RDW 13.0 01/17/2018 1222   RDW 14.9 (H) 05/29/2012 1502   LYMPHSABS 2.4 01/17/2018 1222   LYMPHSABS 2.9 05/29/2012 1502   MONOABS 0.7 05/13/2014 1158   MONOABS 0.7 05/29/2012 1502   EOSABS 0.3 01/17/2018 1222   BASOSABS 0.0 01/17/2018 1222   BASOSABS 0.0 05/29/2012 1502   Iron/TIBC/Ferritin/ %Sat    Component Value Date/Time   IRON 41 (L) 11/20/2008 1222   TIBC 319 11/20/2008 1222   FERRITIN 29 11/20/2008 1222   IRONPCTSAT 13 (L) 11/20/2008 1222   Lipid Panel     Component Value Date/Time   CHOL 180 06/21/2018 1121   TRIG 53 06/21/2018 1121   HDL 68 06/21/2018 1121   LDLCALC 101 (H) 06/21/2018 1121   Hepatic Function Panel     Component Value Date/Time   PROT 7.7 06/21/2018 1121   ALBUMIN 3.9 06/21/2018 1121   AST 17 06/21/2018 1121   ALT 11 06/21/2018 1121   ALKPHOS 71 06/21/2018 1121   BILITOT 0.4 06/21/2018 1121   BILIDIR 0.1 02/18/2008 1458   IBILI 0.3 02/18/2008 1458      Component Value Date/Time   TSH 1.190 01/17/2018 1222      OBESITY BEHAVIORAL INTERVENTION VISIT  Today's visit was # 13   Starting weight: 276 lbs Starting date: 03/19/17 Today's weight : 280 lbs Today's date: 09/25/2018 Total lbs lost to date: 0    ASK: We discussed the diagnosis of obesity with Raoul Pitch today and Tawsha agreed to give [place] permission to discuss obesity behavioral modification therapy today.  ASSESS: Somer has the diagnosis of obesity and her BMI today is 45.21 Jasneet is in the action stage of change   ADVISE: Jameah was educated on the multiple health  risks of obesity as well as the benefit of weight loss to improve her health. She was advised of the need for long term treatment and the importance of lifestyle modifications to improve her current health and to decrease her risk of  future health problems.  AGREE: Multiple dietary modification options and treatment options were discussed and  Lillion agreed to follow the recommendations documented in the above note.  ARRANGE: Nike was educated on the importance of frequent visits to treat obesity as outlined per CMS and USPSTF guidelines and agreed to schedule her next follow up appointment today.  I, Trixie Dredge, am acting as transcriptionist for Dennard Nip, MD  I have reviewed the above documentation for accuracy and completeness, and I agree with the above. -Dennard Nip, MD

## 2018-10-09 ENCOUNTER — Ambulatory Visit (INDEPENDENT_AMBULATORY_CARE_PROVIDER_SITE_OTHER): Payer: BC Managed Care – PPO | Admitting: Physician Assistant

## 2018-10-25 ENCOUNTER — Ambulatory Visit (INDEPENDENT_AMBULATORY_CARE_PROVIDER_SITE_OTHER): Payer: BC Managed Care – PPO | Admitting: Sports Medicine

## 2018-10-25 ENCOUNTER — Other Ambulatory Visit: Payer: Self-pay

## 2018-10-25 ENCOUNTER — Encounter: Payer: Self-pay | Admitting: Sports Medicine

## 2018-10-25 VITALS — BP 136/87 | Ht 66.5 in | Wt 275.0 lb

## 2018-10-25 DIAGNOSIS — M1711 Unilateral primary osteoarthritis, right knee: Secondary | ICD-10-CM | POA: Diagnosis not present

## 2018-10-25 MED ORDER — METHYLPREDNISOLONE ACETATE 40 MG/ML IJ SUSP
40.0000 mg | Freq: Once | INTRAMUSCULAR | Status: AC
  Administered 2018-10-25: 40 mg via INTRA_ARTICULAR

## 2018-10-25 NOTE — Progress Notes (Signed)
  Laura [name] - 63 y.o. female MRN [id number]  Date of birth: 28-Mar-1955  SUBJECTIVE:   CC: right knee pain  63 yo female with history of moderate-severe DJD of bilateral knees presenting with chronic right knee pain. Her last  cortisone injection in right knee was in May and she reports that it is starting to wear off and she would like another injection. Pain mainly over anterior knee, unable to fully extend knee. Hurts worse after extended periods of sitting. She was planning to have knee replaced but was delayed due to [place].   ROS: No unexpected weight loss, fever, chills, swelling, instability, muscle pain, numbness/tingling, redness, otherwise see HPI   PMHx - Updated and reviewed.  Contributory factors include: Negative PSHx - Updated and reviewed.  Contributory factors include:  Negative FHx - Updated and reviewed.  Contributory factors include:  Negative Social Hx - Updated and reviewed. Contributory factors include: Negative Medications - reviewed   DATA REVIEWED: Prior records  PHYSICAL EXAM:  VS: BP:136/87  HR: bpm  TEMP: ( )  RESP:   HT:5' 6.5" (168.9 cm)   WT:275 lb (124.7 kg)  BMI:43.73 PHYSICAL EXAM: Gen: NAD, alert, cooperative with exam, well-appearing HEENT: clear conjunctiva,  CV:  no edema, capillary refill brisk, normal rate Resp: non-labored Skin: no rashes, normal turgor  Neuro: no gross deficits.  Psych:  alert and oriented  Right Knee: - Inspection: no gross deformity. No swelling/effusion, erythema or bruising. Skin intact - Palpation: TTP over medial and lateral joint line - ROM: extension limited to 20 degrees, normal flexion. 2+ crepitus with movement  - Strength: 5/5 strength - Neuro/vasc: NV intact   Left Knee: - Inspection: no gross deformity. No swelling/effusion, erythema or bruising. Skin intact - Palpation: no TTP - ROM: extension limited to 10 degrees, normal flexion  - Strength: 5/5 strength - Neuro/vasc: NV intact  ASSESSMENT  & PLAN:  Right knee pain- known osteoarthritis. Repeat corticosteroid injection today. Is seeing orthopedics to plan replacement of right knee, currently delayed due to [place].  Procedure performed: knee intraarticular corticosteroid injection; palpation guided  Consent obtained and verified. Time-out conducted. Noted no overlying erythema, induration, or other signs of local infection. The left anterior-lateral joint space was palpated and marked. The overlying skin was prepped in a sterile fashion. Topical analgesic spray: Ethyl chloride. Joint: right knee Needle: 25 gauge, 1.5 inch Completed without difficulty. Meds: 1 ml (40 mg) depo-medrol, 3 mls lidocaine 1%  Advised to call if fevers/chills, erythema, induration, drainage, or persistent bleeding.  Patient seen and evaluated with the sports medicine fellow.  I agree with the above plan of care.  Patient is an established patient of Dr. Anne Fu and is planning on having her right total knee arthroplasty when COVID-19 pandemic passes.  She understands that total knee arthroplasty is definitive treatment and that repeat cortisone injections can lose their effectiveness over time.  Follow-up as needed.

## 2018-11-12 ENCOUNTER — Ambulatory Visit (INDEPENDENT_AMBULATORY_CARE_PROVIDER_SITE_OTHER): Payer: BC Managed Care – PPO | Admitting: Family Medicine

## 2018-11-12 ENCOUNTER — Other Ambulatory Visit: Payer: Self-pay

## 2018-11-12 ENCOUNTER — Encounter (INDEPENDENT_AMBULATORY_CARE_PROVIDER_SITE_OTHER): Payer: Self-pay | Admitting: Family Medicine

## 2018-11-12 VITALS — BP 94/63 | HR 82 | Temp 97.9°F | Ht 66.0 in | Wt 282.0 lb

## 2018-11-12 DIAGNOSIS — Z6841 Body Mass Index (BMI) 40.0 and over, adult: Secondary | ICD-10-CM

## 2018-11-12 DIAGNOSIS — R7303 Prediabetes: Secondary | ICD-10-CM

## 2018-11-12 DIAGNOSIS — Z9189 Other specified personal risk factors, not elsewhere classified: Secondary | ICD-10-CM

## 2018-11-12 DIAGNOSIS — E559 Vitamin D deficiency, unspecified: Secondary | ICD-10-CM

## 2018-11-12 MED ORDER — METFORMIN HCL 500 MG PO TABS: 500.0000 mg | ORAL_TABLET | Freq: Two times a day (BID) | ORAL | 0 refills | Status: DC

## 2018-11-12 MED ORDER — VITAMIN D (ERGOCALCIFEROL) 1.25 MG (50000 UNIT) PO CAPS: 50000.0000 [IU] | ORAL_CAPSULE | ORAL | 0 refills | Status: DC

## 2018-11-12 NOTE — Progress Notes (Signed)
Office: [phone]  /  Fax: [phone]   HPI:   Chief Complaint: OBESITY Laura [name] is here to discuss her progress with her obesity treatment plan. She is on the Category 2 plan and is following her eating plan approximately 10% of the time. She states she is exercising 0 minutes 0 times per week. Laura [name] has gotten off track with her eating. She was on vacation and did some celebration eating, but she states she is now ready to get back on track. Her weight is 282 lb (127.9 kg) today and has had a weight gain of 2 lbs since her last visit. She has lost 0 lbs since starting treatment with [place].  Pre-Diabetes Laura [name] has a diagnosis of prediabetes based on her elevated Hgb A1c and was informed this puts her at greater risk of developing diabetes. She has not been following her diet prescription well, is skipping metformin at times and hasn't been motivated to change. She states she is ready to start back with diet and exercise.  At risk for diabetes Laura [name] is at higher than average risk for developing diabetes due to her obesity.   Vitamin D deficiency Laura [name] has a diagnosis of Vitamin D deficiency, which is almost at goal at a level of 49.9 on 06/21/2018. She is currently taking prescription Vit D and denies nausea, vomiting or muscle weakness.  ASSESSMENT AND PLAN:  Prediabetes - Plan: Comprehensive metabolic panel, Hemoglobin A1c, Insulin, random, metFORMIN (GLUCOPHAGE) 500 MG tablet  Vitamin D deficiency - Plan: VITAMIN D 25 Hydroxy (Vit-D Deficiency, Fractures), Vitamin D, Ergocalciferol, (DRISDOL) 1.25 MG (50000 UT) CAPS capsule  At risk for diabetes mellitus  Class 3 severe obesity with serious comorbidity and body mass index (BMI) of 45.0 to 49.9 in adult, unspecified obesity type [facility])  PLAN:  Pre-Diabetes Laura [name] will continue to work on weight loss, exercise, and decreasing simple carbohydrates in her diet to help decrease the risk of diabetes. We dicussed metformin including benefits  and risks. She was informed that eating too many simple carbohydrates or too many calories at one sitting increases the likelihood of GI side effects. Laura [name] was given a refill on her metformin 500 mg #60 with 0 refills and agrees to follow-up with our clinic in 2-3 weeks.  Diabetes risk counseling Laura [name] was given extended (15 minutes) diabetes prevention counseling today. She is 63 y.o. female and has risk factors for diabetes including obesity. We discussed intensive lifestyle modifications today with an emphasis on weight loss as well as increasing exercise and decreasing simple carbohydrates in her diet.  Vitamin D Deficiency Laura [name] was informed that low Vitamin D levels contributes to fatigue and are associated with obesity, breast, and colon cancer. She agrees to continue to take prescription Vit D @ 50,000 IU every week #4 with 0 refills and will follow-up for routine testing of Vitamin D, at least 2-3 times per year. She was informed of the risk of over-replacement of Vitamin D and agrees to not increase her dose unless she discusses this with [place] first. Laura [name] agrees to follow-up with our clinic in 2-3 weeks.  Obesity Laura [name] is currently in the action stage of change. As such, her goal is to continue with weight loss efforts. She has agreed to follow the Category 2 plan or the Pescatarian plan. Laura [name] has been instructed to work up to a goal of 150 minutes of combined cardio and strengthening exercise per week for weight loss and overall health benefits. We discussed the following Behavioral Modification Strategies  today: increasing lean protein intake and decreasing simple carbohydrates.  Laura [name] has agreed to follow-up with our clinic in 2-3 weeks. She was informed of the importance of frequent follow-up visits to maximize her success with intensive lifestyle modifications for her multiple health conditions.  ALLERGIES: No Known Allergies  MEDICATIONS: Current Outpatient Medications on File Prior to  Visit  Medication Sig Dispense Refill  . lisinopril-hydrochlorothiazide (PRINZIDE,ZESTORETIC) 20-25 MG tablet TAKE 1 TABLET BY MOUTH ONCE DAILY FOR 90 DAYS  1  . rivaroxaban (XARELTO) 10 MG TABS tablet Take 1 tablet (10 mg total) by mouth daily. 90 tablet 3   No current facility-administered medications on file prior to visit.     PAST MEDICAL HISTORY: Past Medical History:  Diagnosis Date  . Anemia, normocytic normochromic 07/04/2017   Chronic Hb 10 +/- 0.5 gms  . Edema   . 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 Herpes   . High blood pressure   . History of blood clots   . Obesity   . Partial tear of Achilles tendon   . Pneumonia   . 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 Pulmonary embolism ([place])   . 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 Vitamin D deficiency     PAST SURGICAL HISTORY: Past Surgical History:  Procedure Laterality Date  . BREAST BIOPSY    . [place]  . 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 Smoking status: Never Smoker  . Smokeless tobacco: Never Used  Substance Use Topics  . Alcohol use: Yes    Alcohol/week: 0.0 standard drinks    Comment: Rarely.  . Drug use: No    FAMILY HISTORY: Family History  Problem Relation Age of Onset  . Hypertension Mother   . Heart failure Mother   . Cancer Mother   . Depression Mother   . Sleep apnea Mother   . Obesity Mother   . Prostate cancer Father   . Multiple myeloma Father   . Heart failure Father   . Obesity Father   . Sleep apnea Father   . Kidney disease Father   . Sudden Cardiac Death Father   . Hypertension Father   . Heart attack Neg Hx   . Hyperlipidemia  Neg Hx   . Diabetes Neg Hx   . Sudden death Neg Hx   . Breast cancer Neg Hx    ROS: Review of Systems  Gastrointestinal: Negative for nausea and vomiting.  Musculoskeletal:       Negative for muscle weakness.   PHYSICAL EXAM: Blood pressure 94/63, pulse 82, temperature 97.9 F (36.6 C), temperature source Oral, height '5\' 6"'$  (1.676 m), weight 282 lb (127.9 kg), SpO2 96 %. Body mass index is 45.52 kg/m. Physical Exam Vitals signs reviewed.  Constitutional:      Appearance: Normal appearance. She is obese.  Cardiovascular:     Rate and Rhythm: Normal rate.     Pulses: Normal pulses.  Pulmonary:     Effort: Pulmonary effort is normal.     Breath sounds: Normal breath sounds.  Musculoskeletal: Normal range of motion.  Skin:    General:  Skin is warm and dry.  Neurological:     Mental Status: She is alert and oriented to person, place, and time.  Psychiatric:        Behavior: Behavior normal.   RECENT LABS AND TESTS: BMET    Component Value Date/Time   NA 136 06/21/2018 1121   K 4.5 06/21/2018 1121   CL 96 06/21/2018 1121   CO2 24 06/21/2018 1121   GLUCOSE 81 06/21/2018 1121   GLUCOSE 129 (H) 09/07/2016 0437   BUN 20 06/21/2018 1121   CREATININE 0.88 06/21/2018 1121   CREATININE 0.83 05/13/2014 1158   CALCIUM 9.7 06/21/2018 1121   GFRNONAA 72 06/21/2018 1121   GFRAA 83 06/21/2018 1121   Lab Results  Component Value Date   HGBA1C 5.9 (H) 06/21/2018   HGBA1C 5.8 (H) 01/17/2018   Lab Results  Component Value Date   INSULIN 13.9 06/21/2018   INSULIN 8.4 01/17/2018   CBC    Component Value Date/Time   WBC 8.6 01/17/2018 1222   WBC 13.8 (H) 09/08/2016 0409   RBC 4.19 01/17/2018 1222   RBC 3.63 (L) 09/08/2016 0409   HGB 12.1 01/17/2018 1222   HGB 12.8 05/29/2012 1502   HCT 37.4 01/17/2018 1222   HCT 40.0 05/29/2012 1502   PLT 289 07/04/2017 0938   MCV 89 01/17/2018 1222   MCV 90.3 05/29/2012 1502   MCH 28.9 01/17/2018 1222   MCH 28.4 09/08/2016 0409    MCHC 32.4 01/17/2018 1222   MCHC 30.7 09/08/2016 0409   RDW 13.0 01/17/2018 1222   RDW 14.9 (H) 05/29/2012 1502   LYMPHSABS 2.4 01/17/2018 1222   LYMPHSABS 2.9 05/29/2012 1502   MONOABS 0.7 05/13/2014 1158   MONOABS 0.7 05/29/2012 1502   EOSABS 0.3 01/17/2018 1222   BASOSABS 0.0 01/17/2018 1222   BASOSABS 0.0 05/29/2012 1502   Iron/TIBC/Ferritin/ %Sat    Component Value Date/Time   IRON 41 (L) 11/20/2008 1222   TIBC 319 11/20/2008 1222   FERRITIN 29 11/20/2008 1222   IRONPCTSAT 13 (L) 11/20/2008 1222   Lipid Panel     Component Value Date/Time   CHOL 180 06/21/2018 1121   TRIG 53 06/21/2018 1121   HDL 68 06/21/2018 1121   LDLCALC 101 (H) 06/21/2018 1121   Hepatic Function Panel     Component Value Date/Time   PROT 7.7 06/21/2018 1121   ALBUMIN 3.9 06/21/2018 1121   AST 17 06/21/2018 1121   ALT 11 06/21/2018 1121   ALKPHOS 71 06/21/2018 1121   BILITOT 0.4 06/21/2018 1121   BILIDIR 0.1 02/18/2008 1458   IBILI 0.3 02/18/2008 1458      Component Value Date/Time   TSH 1.190 01/17/2018 1222   Results for ROZINA, POINTER (MRN [id number]) as of 11/12/2018 09:18  Ref. Range 06/21/2018 11:21  Vitamin D, 25-Hydroxy Latest Ref Range: 30.0 - 100.0 ng/mL 49.9   OBESITY BEHAVIORAL INTERVENTION VISIT  Today's visit was #14  Starting weight: 278 lbs Starting date: 01/17/2018 Today's weight: 282 lbs Today's date: 11/12/2018 Total lbs lost to date: 0    11/12/2018  Height _0  (1.676 m)  Weight 282 lb (127.9 kg)  BMI (Calculated) 45.54  BLOOD PRESSURE - SYSTOLIC 94  BLOOD PRESSURE - DIASTOLIC 63   Body Fat % 96.2 %  Total Body Water (lbs) 98 lbs   ASK: We discussed the diagnosis of obesity with Laura [name] today and Laura [name] agreed to give [place] permission to discuss obesity behavioral modification therapy today.  ASSESS: Laura [name] has  the diagnosis of obesity and her BMI today is 45.6. Laura [name] is in the action stage of change.   ADVISE: Laura [name] was educated on the multiple health  risks of obesity as well as the benefit of weight loss to improve her health. She was advised of the need for long term treatment and the importance of lifestyle modifications to improve her current health and to decrease her risk of future health problems.  AGREE: Multiple dietary modification options and treatment options were discussed and  Laura [name] agreed to follow the recommendations documented in the above note.  ARRANGE: Laura [name] was educated on the importance of frequent visits to treat obesity as outlined per CMS and USPSTF guidelines and agreed to schedule her next follow up appointment today.  I, Michaelene Song, am acting as Location manager for Dennard Nip, MD  I have reviewed the above documentation for accuracy and completeness, and I agree with the above. -Dennard Nip, MD

## 2018-11-13 LAB — COMPREHENSIVE METABOLIC PANEL
ALT: 13 IU/L (ref 0–32)
AST: 16 IU/L (ref 0–40)
Albumin/Globulin Ratio: 1 — ABNORMAL LOW (ref 1.2–2.2)
Albumin: 3.8 g/dL (ref 3.8–4.9)
Alkaline Phosphatase: 77 IU/L (ref 39–117)
BUN/Creatinine Ratio: 30 — ABNORMAL HIGH (ref 12–28)
BUN: 29 mg/dL — ABNORMAL HIGH (ref 8–27)
Bilirubin Total: 0.3 mg/dL (ref 0.0–1.2)
CO2: 28 mmol/L (ref 20–29)
Calcium: 9.5 mg/dL (ref 8.7–10.3)
Chloride: 96 mmol/L (ref 96–106)
Creatinine, Ser: 0.98 mg/dL (ref 0.57–1.00)
GFR calc Af Amer: 73 mL/min/{1.73_m2} (ref >59)
GFR calc non Af Amer: 63 mL/min/{1.73_m2} (ref >59)
Globulin, Total: 4 g/dL (ref 1.5–4.5)
Glucose: 92 mg/dL (ref 65–99)
Potassium: 4.3 mmol/L (ref 3.5–5.2)
Sodium: 136 mmol/L (ref 134–144)
Total Protein: 7.8 g/dL (ref 6.0–8.5)

## 2018-11-13 LAB — INSULIN, RANDOM: INSULIN: 11.8 u[IU]/mL (ref 2.6–24.9)

## 2018-11-13 LAB — HEMOGLOBIN A1C
Est. average glucose Bld gHb Est-mCnc: 117 mg/dL
Hgb A1c MFr Bld: 5.7 % — ABNORMAL HIGH (ref 4.8–5.6)

## 2018-11-13 LAB — VITAMIN D 25 HYDROXY (VIT D DEFICIENCY, FRACTURES): Vit D, 25-Hydroxy: 39.8 ng/mL (ref 30.0–100.0)

## 2018-12-03 ENCOUNTER — Ambulatory Visit (INDEPENDENT_AMBULATORY_CARE_PROVIDER_SITE_OTHER): Payer: BC Managed Care – PPO | Admitting: Family Medicine

## 2018-12-13 ENCOUNTER — Other Ambulatory Visit: Payer: Self-pay

## 2018-12-13 ENCOUNTER — Encounter (INDEPENDENT_AMBULATORY_CARE_PROVIDER_SITE_OTHER): Payer: Self-pay | Admitting: Family Medicine

## 2018-12-13 ENCOUNTER — Telehealth (INDEPENDENT_AMBULATORY_CARE_PROVIDER_SITE_OTHER): Payer: BC Managed Care – PPO | Admitting: Family Medicine

## 2018-12-13 DIAGNOSIS — Z6841 Body Mass Index (BMI) 40.0 and over, adult: Secondary | ICD-10-CM | POA: Diagnosis not present

## 2018-12-13 DIAGNOSIS — E559 Vitamin D deficiency, unspecified: Secondary | ICD-10-CM | POA: Diagnosis not present

## 2018-12-13 DIAGNOSIS — R7303 Prediabetes: Secondary | ICD-10-CM | POA: Diagnosis not present

## 2018-12-13 MED ORDER — VITAMIN D (ERGOCALCIFEROL) 1.25 MG (50000 UNIT) PO CAPS: 50000.0000 [IU] | ORAL_CAPSULE | ORAL | 0 refills | Status: DC

## 2018-12-13 MED ORDER — METFORMIN HCL 500 MG PO TABS: 500.0000 mg | ORAL_TABLET | Freq: Two times a day (BID) | ORAL | 0 refills | Status: DC

## 2018-12-17 NOTE — Progress Notes (Signed)
Office: [phone]  /  Fax: [phone] TeleHealth Visit:  Laura [name] has verbally consented to this TeleHealth visit today. The patient is located at work, the provider is located at the [employer] [facility] office. The participants in this visit include the listed provider and patient and any and all parties involved. The visit was conducted today via telephone. Laura [name] was unable to use realtime audiovisual technology today (FaceTime failed) and the telehealth visit was conducted via telephone.  HPI:   Chief Complaint: OBESITY Laura [name] is here to discuss her progress with her obesity treatment plan. She is on the Category 2 plan and is following her eating plan approximately 60 % of the time. She states she is exercising 0 minutes 0 times per week. Laura [name] has done better with weight loss, but she is discouraged overall with her pandemic weight gain. She is working in the office for two weeks, then she is working at home for two weeks. Her eating is better when she is in the office, but her exercise is better when she works from home. We were unable to weigh the patient today for this TeleHealth visit. She feels as if she has lost weight since her last visit (weight not reported). She has lost 0 lbs since starting treatment with [place].  Pre-Diabetes Laura [name] has a diagnosis of prediabetes based on her elevated Hgb A1c and was informed this puts her at greater risk of developing diabetes. Her A1c has improved on metformin. She is taking her second dose around 4:00 PM without food and she notes some mild nausea. Laura [name] continues to work on diet and exercise to decrease the risk of diabetes.   Vitamin D deficiency Laura [name] has a diagnosis of vitamin D deficiency. Her vitamin D has decreased, when patient forgot to take her vitamin D and she is no longer at goal. Laura [name] admits fatigue and she denies nausea, vomiting or muscle weakness.  ASSESSMENT AND PLAN:  Prediabetes - Plan: metFORMIN (GLUCOPHAGE) 500 MG  tablet  Vitamin D deficiency - Plan: Vitamin D, Ergocalciferol, (DRISDOL) 1.25 MG (50000 UT) CAPS capsule  Class 3 severe obesity with serious comorbidity and body mass index (BMI) of 45.0 to 49.9 in adult, unspecified obesity type Laura [name])  PLAN:  Pre-Diabetes Laura [name] will continue to work on weight loss, exercise, and decreasing simple carbohydrates in her diet to help decrease the risk of diabetes. She was informed that eating too many simple carbohydrates or too many calories at one sitting increases the likelihood of GI side effects. Laura [name] agreed to continue metformin 500 mg two times daily #60 with no refills and take her second dose with food. Laura [name] agrees to follow up with [place] as directed to monitor her progress.  Vitamin D Deficiency Laura [name] was informed that low vitamin D levels contributes to fatigue and are associated with obesity, breast, and colon cancer. Laura [name] agrees to continue to take prescription Vit D @50 ,000 IU every week #4 with no refills and she will follow up for routine testing of vitamin D, at least 2-3 times per year. She was informed of the risk of over-replacement of vitamin D and agrees to not increase her dose unless she discusses this with [place] first. We will recheck labs in 3 months and Laura [name] agrees to follow up as directed.  I spent > than 50% of the 25 minute visit on counseling as documented in the note.  Obesity Laura [name] is currently in the action stage of change. As such, her goal is to continue  with weight loss efforts She has agreed to follow the Category 2 plan Laura [name] has been instructed to work up to a goal of 150 minutes of combined cardio and strengthening exercise per week for weight loss and overall health benefits. We discussed the following Behavioral Modification Strategies today: work on meal planning and easy cooking plans and emotional eating strategies  Laura [name] has agreed to follow up with our clinic in 3 weeks. She was informed of the importance of frequent follow  up visits to maximize her success with intensive lifestyle modifications for her multiple health conditions.  ALLERGIES: No Known Allergies  MEDICATIONS: Current Outpatient Medications on File Prior to Visit  Medication Sig Dispense Refill  . lisinopril-hydrochlorothiazide (PRINZIDE,ZESTORETIC) 20-25 MG tablet TAKE 1 TABLET BY MOUTH ONCE DAILY FOR 90 DAYS  1  . rivaroxaban (XARELTO) 10 MG TABS tablet Take 1 tablet (10 mg total) by mouth daily. 90 tablet 3   No current facility-administered medications on file prior to visit.     PAST MEDICAL HISTORY: Past Medical History:  Diagnosis Date  . Anemia, normocytic normochromic 07/04/2017   Chronic Hb 10 +/- 0.5 gms  . Edema   . 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 Herpes   . High blood pressure   . History of blood clots   . Obesity   . Partial tear of Achilles tendon   . Pneumonia   . 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 Pulmonary embolism ([place])   . 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 Vitamin D deficiency     PAST SURGICAL HISTORY: Past Surgical History:  Procedure Laterality Date  . BREAST BIOPSY    . [place]  . 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 Smoking status: Never Smoker  . Smokeless tobacco: Never Used  Substance Use Topics  . Alcohol use: Yes    Alcohol/week: 0.0 standard drinks    Comment: Rarely.  . Drug use: No    FAMILY HISTORY: Family History  Problem Relation Age of Onset  . Hypertension Mother   . Heart failure Mother   . Cancer Mother   . Depression Mother   . Sleep apnea Mother   . Obesity Mother   . Prostate cancer Father   . Multiple myeloma  Father   . Heart failure Father   . Obesity Father   . Sleep apnea Father   . Kidney disease Father   . Sudden Cardiac Death Father   . Hypertension Father   . Heart attack Neg Hx   . Hyperlipidemia Neg Hx   . Diabetes Neg Hx   . Sudden death Neg Hx   . Breast cancer Neg Hx     ROS: Review of Systems  Constitutional: Positive for malaise/fatigue and weight loss.  Gastrointestinal: Positive for nausea.    PHYSICAL EXAM: Pt in no acute distress  RECENT LABS AND TESTS: BMET    Component Value Date/Time   NA 136 11/12/2018 0927   K 4.3 11/12/2018 0927   CL 96 11/12/2018 0927   CO2 28 11/12/2018 0927   GLUCOSE 92 11/12/2018 0927   GLUCOSE  129 (H) 09/07/2016 0437   BUN 29 (H) 11/12/2018 0927   CREATININE 0.98 11/12/2018 0927   CREATININE 0.83 05/13/2014 1158   CALCIUM 9.5 11/12/2018 0927   GFRNONAA 63 11/12/2018 0927   GFRAA 73 11/12/2018 0927   Lab Results  Component Value Date   HGBA1C 5.7 (H) 11/12/2018   HGBA1C 5.9 (H) 06/21/2018   HGBA1C 5.8 (H) 01/17/2018   Lab Results  Component Value Date   INSULIN 11.8 11/12/2018   INSULIN 13.9 06/21/2018   INSULIN 8.4 01/17/2018   CBC    Component Value Date/Time   WBC 8.6 01/17/2018 1222   WBC 13.8 (H) 09/08/2016 0409   RBC 4.19 01/17/2018 1222   RBC 3.63 (L) 09/08/2016 0409   HGB 12.1 01/17/2018 1222   HGB 12.8 05/29/2012 1502   HCT 37.4 01/17/2018 1222   HCT 40.0 05/29/2012 1502   PLT 289 07/04/2017 0938   MCV 89 01/17/2018 1222   MCV 90.3 05/29/2012 1502   MCH 28.9 01/17/2018 1222   MCH 28.4 09/08/2016 0409   MCHC 32.4 01/17/2018 1222   MCHC 30.7 09/08/2016 0409   RDW 13.0 01/17/2018 1222   RDW 14.9 (H) 05/29/2012 1502   LYMPHSABS 2.4 01/17/2018 1222   LYMPHSABS 2.9 05/29/2012 1502   MONOABS 0.7 05/13/2014 1158   MONOABS 0.7 05/29/2012 1502   EOSABS 0.3 01/17/2018 1222   BASOSABS 0.0 01/17/2018 1222   BASOSABS 0.0 05/29/2012 1502   Iron/TIBC/Ferritin/ %Sat    Component Value Date/Time   IRON  41 (L) 11/20/2008 1222   TIBC 319 11/20/2008 1222   FERRITIN 29 11/20/2008 1222   IRONPCTSAT 13 (L) 11/20/2008 1222   Lipid Panel     Component Value Date/Time   CHOL 180 06/21/2018 1121   TRIG 53 06/21/2018 1121   HDL 68 06/21/2018 1121   LDLCALC 101 (H) 06/21/2018 1121   Hepatic Function Panel     Component Value Date/Time   PROT 7.8 11/12/2018 0927   ALBUMIN 3.8 11/12/2018 0927   AST 16 11/12/2018 0927   ALT 13 11/12/2018 0927   ALKPHOS 77 11/12/2018 0927   BILITOT 0.3 11/12/2018 0927   BILIDIR 0.1 02/18/2008 1458   IBILI 0.3 02/18/2008 1458      Component Value Date/Time   TSH 1.190 01/17/2018 1222     Ref. Range 11/12/2018 09:27  Vitamin D, 25-Hydroxy Latest Ref Range: 30.0 - 100.0 ng/mL 39.8    I, Doreene Nest, am acting as Location manager for Dennard Nip, MD I have reviewed the above documentation for accuracy and completeness, and I agree with the above. -Dennard Nip, MD

## 2018-12-25 ENCOUNTER — Other Ambulatory Visit: Payer: Self-pay | Admitting: Family Medicine

## 2018-12-25 DIAGNOSIS — E2839 Other primary ovarian failure: Secondary | ICD-10-CM

## 2018-12-25 DIAGNOSIS — Z1231 Encounter for screening mammogram for malignant neoplasm of breast: Secondary | ICD-10-CM

## 2019-01-07 ENCOUNTER — Ambulatory Visit (INDEPENDENT_AMBULATORY_CARE_PROVIDER_SITE_OTHER): Payer: BC Managed Care – PPO | Admitting: Family Medicine

## 2019-01-25 ENCOUNTER — Encounter: Payer: Self-pay | Admitting: Family Medicine

## 2019-01-25 ENCOUNTER — Ambulatory Visit: Payer: Self-pay

## 2019-01-25 ENCOUNTER — Ambulatory Visit (INDEPENDENT_AMBULATORY_CARE_PROVIDER_SITE_OTHER): Payer: BC Managed Care – PPO | Admitting: Family Medicine

## 2019-01-25 ENCOUNTER — Other Ambulatory Visit: Payer: Self-pay

## 2019-01-25 VITALS — BP 105/72 | HR 96 | Ht 67.0 in | Wt 265.0 lb

## 2019-01-25 DIAGNOSIS — M1711 Unilateral primary osteoarthritis, right knee: Secondary | ICD-10-CM

## 2019-01-25 MED ORDER — TRIAMCINOLONE ACETONIDE 40 MG/ML IJ SUSP
40.0000 mg | Freq: Once | INTRAMUSCULAR | Status: AC
  Administered 2019-01-25: 09:00:00 40 mg via INTRA_ARTICULAR

## 2019-01-25 MED ORDER — PENNSAID 2 % EX SOLN: 1.0000 "application " | Freq: Two times a day (BID) | CUTANEOUS | 3 refills | Status: DC

## 2019-01-25 NOTE — Progress Notes (Signed)
Medication Samples have been provided to the patient.  Drug name: Pennsaid       Strength: 2%        Qty: 1 Box  LOT: [id number]  Exp.Date: 10/2019  Dosing instructions: Use a peasize amount and rub gently.  The patient has been instructed regarding the correct time, dose, and frequency of taking this medication, including desired effects and most common side effects.   Sherrie George, MA 8:59 AM 01/25/2019

## 2019-01-25 NOTE — Assessment & Plan Note (Signed)
Acute on chronic in nature.  Plans to have a total knee arthroplasty performed in the next year.  Counseled on the medial unloader will like to hold off on it for now. -Injection. -Pennsaid and Pennsaid sample.  Counseled on taking an anti-inflammatory with a blood thinner. -Counseled on home exercise therapy and supportive care. -Can follow-up in 3 months.

## 2019-01-25 NOTE — Patient Instructions (Signed)
Nice to meet you Please try ice  Please try the rub on medicine  Please try the exercises   Please send me a message in MyChart with any questions or updates.  Please see me back in 3 months.   --Dr. Raeford Razor

## 2019-01-25 NOTE — Progress Notes (Signed)
Francys Bolin - 63 y.o. female MRN [id number]  Date of birth: 11/23/1955  SUBJECTIVE:  Including CC & ROS.  Chief Complaint  Patient presents with  . Knee Pain    right knee    Blakelynn Scheeler is a 63 y.o. female that is presenting with acute on chronic right knee pain.  The pain is moderate to severe.  She denies mechanical symptoms.  She has had a left total knee arthroplasty done.  She is received several steroid injections that seem to last for about 3 months.  She has tried gel injections with limited improvement.  The pain is over the medial joint line.  The pain can be throbbing and severe.  Pain is worse with ambulation.  Pain seems to be localized to the joint.   Review of Systems  Constitutional: Negative for fever.  HENT: Negative for congestion.   Respiratory: Negative for cough.   Cardiovascular: Negative for chest pain.  Gastrointestinal: Negative for abdominal pain.  Musculoskeletal: Positive for arthralgias and gait problem.  Skin: Negative for color change.  Neurological: Negative for weakness.  Hematological: Negative for adenopathy.    HISTORY: Past Medical, Surgical, Social, and Family History Reviewed & Updated per EMR.   Pertinent Historical Findings include:  Past Medical History:  Diagnosis Date  . Anemia, normocytic normochromic 07/04/2017   Chronic Hb 10 +/- 0.5 gms  . Edema   . 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 Herpes   . High blood pressure   . History of blood clots   . Obesity   . Partial tear of Achilles tendon   . Pneumonia   . 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 Pulmonary embolism ([place])   . 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 Vitamin D deficiency     Past Surgical History:  Procedure Laterality Date  . BREAST BIOPSY    . [place]  . 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   Procedure: LEFT TOTAL KNEE ARTHROPLASTY;  Surgeon: Gaynelle Arabian, MD;  Location: [facility];  Service: Orthopedics;  Laterality: Left;    No Known Allergies  Family History  Problem Relation Age of Onset  . Hypertension Mother   . Heart failure Mother   . Cancer Mother   . Depression Mother   . Sleep apnea Mother   . Obesity Mother   . Prostate cancer Father   . Multiple myeloma Father   . Heart failure Father   . Obesity Father   . Sleep apnea Father   . Kidney disease Father   . Sudden Cardiac Death Father   . Hypertension Father   . Heart attack Neg Hx   . Hyperlipidemia Neg Hx   . Diabetes Neg Hx   . Sudden death Neg Hx   . Breast cancer Neg Hx      Social History   Socioeconomic History  . Marital status: Married    Spouse name: Jaloni Sorber  . Number of children: 2  . Years of education: Not on file  . Highest education level: Not on file  Occupational History  . Occupation: Engineer, building services: [employer]  . Financial resource strain: Not on file  . Food insecurity    Worry: Not on file    Inability: Not on file  .  Transportation needs    Medical: Not on file    Non-medical: Not on file  Tobacco Use  . Smoking status: Never Smoker  . Smokeless tobacco: Never Used  Substance and Sexual Activity  . Alcohol use: Yes    Alcohol/week: 0.0 standard drinks    Comment: Rarely.  . Drug use: No  . Sexual activity: Not on file  Lifestyle  . Physical activity    Days per week: Not on file    Minutes per session: Not on file  . Stress: Not on file  Relationships  . Social Herbalist on phone: Not on file    Gets together: Not on file    Attends religious service: Not on file    Active member of club or organization: Not on file    Attends meetings of clubs or organizations: Not on file    Relationship status: Not on file  . Intimate partner violence    Fear of current or ex partner: Not on file    Emotionally abused:  Not on file    Physically abused: Not on file    Forced sexual activity: Not on file  Other Topics Concern  . Not on file  Social History Narrative  . Not on file     PHYSICAL EXAM:  VS: BP 105/72   Pulse 96   Ht _0  (1.702 m)   Wt 265 lb (120.2 kg)   BMI 41.50 kg/m  Physical Exam Gen: NAD, alert, cooperative with exam, well-appearing ENT: normal lips, normal nasal mucosa,  Eye: normal EOM, normal conjunctiva and lids CV:  no edema, +2 pedal pulses   Resp: no accessory muscle use, non-labored,   Skin: no rashes, no areas of induration  Neuro: normal tone, normal sensation to touch Psych:  normal insight, alert and oriented MSK:  Right knee: No obvious effusion. Tenderness to palpation over the medial and lateral joint line. Normal range of motion. Instability with valgus and varus stress testing. Negative McMurray's test. Pain with patellar grind. Neurovascularly intact   Aspiration/Injection Procedure Note Melina Mosteller 04-27-1955  Procedure: Injection Indications: Right knee pain  Procedure Details Consent: Risks of procedure as well as the alternatives and risks of each were explained to the (patient/caregiver).  Consent for procedure obtained. Time Out: Verified patient identification, verified procedure, site/side was marked, verified correct patient position, special equipment/implants available, medications/allergies/relevent history reviewed, required imaging and test results available.  Performed.  The area was cleaned with iodine and alcohol swabs.    The right lateral suprapatellar pouch was injected using 3 cc of 1% lidocaine without epinephrine on a 22-gauge 1-1/2 inch needle.  The syringe was switched and a mixture containing 1 cc's of 40 mg Kenalog and 4 cc's of 0.25% bupivacaine was injected.  Ultrasound was used. Images were obtained in long views showing the injection.     A sterile dressing was applied.  Patient did tolerate procedure well.      ASSESSMENT & PLAN:   OA (osteoarthritis) of knee Acute on chronic in nature.  Plans to have a total knee arthroplasty performed in the next year.  Counseled on the medial unloader will like to hold off on it for now. -Injection. -Pennsaid and Pennsaid sample.  Counseled on taking an anti-inflammatory with a blood thinner. -Counseled on home exercise therapy and supportive care. -Can follow-up in 3 months.

## 2019-03-19 ENCOUNTER — Encounter: Payer: Self-pay | Admitting: Family Medicine

## 2019-03-19 ENCOUNTER — Other Ambulatory Visit: Payer: Self-pay

## 2019-03-19 ENCOUNTER — Ambulatory Visit: Payer: BC Managed Care – PPO | Admitting: Family Medicine

## 2019-03-19 ENCOUNTER — Ambulatory Visit (HOSPITAL_BASED_OUTPATIENT_CLINIC_OR_DEPARTMENT_OTHER)
Admission: RE | Admit: 2019-03-19 | Discharge: 2019-03-19 | Disposition: A | Discharge status: Home / Self Care | Payer: BC Managed Care – PPO | Source: Ambulatory Visit | Attending: Family Medicine | Admitting: Family Medicine

## 2019-03-19 ENCOUNTER — Ambulatory Visit: Payer: Self-pay

## 2019-03-19 VITALS — BP 137/75 | HR 93 | Ht 67.0 in | Wt 270.0 lb

## 2019-03-19 DIAGNOSIS — M7502 Adhesive capsulitis of left shoulder: Secondary | ICD-10-CM | POA: Insufficient documentation

## 2019-03-19 DIAGNOSIS — M19012 Primary osteoarthritis, left shoulder: Secondary | ICD-10-CM

## 2019-03-19 MED ORDER — TRIAMCINOLONE ACETONIDE 40 MG/ML IJ SUSP
40.0000 mg | Freq: Once | INTRAMUSCULAR | Status: AC
  Administered 2019-03-19: 40 mg via INTRA_ARTICULAR

## 2019-03-19 NOTE — Patient Instructions (Signed)
Good to see you Please try heat before the exercise and then ice. Please try the exercises if no insignificant pain. Please send me a message in MyChart with any questions or updates.  Please see me back in 4 weeks.   --Dr. Jordan Likes

## 2019-03-19 NOTE — Progress Notes (Signed)
Laura [name] - 64 y.o. female MRN [id number]  Date of birth: Dec 26, 1955  SUBJECTIVE:  Including CC & ROS.  Chief Complaint  Patient presents with  . Shoulder Pain    left shoulder    Laura [name] is a 64 y.o. female that is presenting with left shoulder pain. THe pain has been ongoing for 3 months. Seems to be staying the same. No inciting event. Localized to the shoulder. No history of similar pain. Trouble with range of motion. No improvement with home therapies. No numbness or tingling.   Review of Systems  See HPI   HISTORY: Past Medical, Surgical, Social, and Family History Reviewed & Updated per EMR.   Pertinent Historical Findings include:  Past Medical History:  Diagnosis Date  . Anemia, normocytic normochromic 07/04/2017   Chronic Hb 10 +/- 0.5 gms  . Edema   . 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 Herpes   . High blood pressure   . History of blood clots   . Obesity   . Partial tear of Achilles tendon   . Pneumonia   . 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 Pulmonary embolism ([place])   . 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 Vitamin D deficiency     Past Surgical History:  Procedure Laterality Date  . BREAST BIOPSY    . [place]  . 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   Procedure: LEFT TOTAL KNEE ARTHROPLASTY;  Surgeon: Laura Arabian, MD;  Location: [facility];  Service: Orthopedics;  Laterality: Left;    No Known Allergies  Family History  Problem Relation Age of Onset  . Hypertension Mother   . Heart failure Mother   . Cancer Mother   . Depression Mother   . Sleep apnea Mother   . Obesity Mother   . Prostate cancer Father   . Multiple myeloma Father   . Heart failure Father   . Obesity Father   . Sleep apnea Father   . Kidney disease Father   . Sudden Cardiac Death Father   . Hypertension  Father   . Heart attack Neg Hx   . Hyperlipidemia Neg Hx   . Diabetes Neg Hx   . Sudden death Neg Hx   . Breast cancer Neg Hx      Social History   Socioeconomic History  . Marital status: Married    Spouse name: Laura [name]  . Number of children: 2  . Years of education: Not on file  . Highest education level: Not on file  Occupational History  . Occupation: Engineer, building services: [employer]  Tobacco Use  . Smoking status: Never Smoker  . Smokeless tobacco: Never Used  Substance and Sexual Activity  . Alcohol use: Yes    Alcohol/week: 0.0 standard drinks    Comment: Rarely.  . Drug use: No  . Sexual activity: Not on file  Other Topics Concern  . Not on file  Social History Narrative  . Not on file   Social Determinants of Health   Financial Resource Strain:   . Difficulty of Paying Living Expenses: Not on file  Food Insecurity:   . Worried About Charity fundraiser in the Last Year: Not on file  . Ran Out of Food in the Last Year: Not on file  Transportation Needs:   . Film/video editor (Medical): Not on file  . Lack of Transportation (Non-Medical): Not on file  Physical Activity:   . Days of Exercise per Week: Not on file  . Minutes of Exercise per Session: Not on file  Stress:   . Feeling of Stress : Not on file  Social Connections:   . Frequency of Communication with Friends and Family: Not on file  . Frequency of Social Gatherings with Friends and Family: Not on file  . Attends Religious Services: Not on file  . Active Member of Clubs or Organizations: Not on file  . Attends Archivist Meetings: Not on file  . Marital Status: Not on file  Intimate Partner Violence:   . Fear of Current or Ex-Partner: Not on file  . Emotionally Abused: Not on file  . Physically Abused: Not on file  . Sexually Abused: Not on file     PHYSICAL EXAM:  VS: BP 137/75   Pulse 93   Ht _0  (1.702 m)   Wt 270 lb (122.5 kg)   BMI 42.29 kg/m    Physical Exam Gen: NAD, alert, cooperative with exam, well-appearing ENT: normal lips, normal nasal mucosa,  Eye: normal EOM, normal conjunctiva and lids CV:  no edema, +2 pedal pulses   Resp: no accessory muscle use, non-labored,  Skin: no rashes, no areas of induration  Neuro: normal tone, normal sensation to touch Psych:  normal insight, alert and oriented MSK:  Left shoulder:  Limited flexion and abduction  Limited ER  Normal strength to resistance with IR and ER  Normal empty can testing  NVI.    Aspiration/Injection Procedure Note Laura [name] 08-10-1955  Procedure: Injection Indications: left shoulder pain   Procedure Details Consent: Risks of procedure as well as the alternatives and risks of each were explained to the (patient/caregiver).  Consent for procedure obtained. Time Out: Verified patient identification, verified procedure, site/side was marked, verified correct patient position, special equipment/implants available, medications/allergies/relevent history reviewed, required imaging and test results available.  Performed.  The area was cleaned with iodine and alcohol swabs.    The left glenohumeral joint was injected using 1 cc's of 40 mg kenalog and 3 cc's of 0.25% bupivacaine, and 3 cc's of 1% lidocaine with a 21 2" needle.  Ultrasound was used. Images were obtained in short views showing the injection.     A sterile dressing was applied.  Patient did tolerate procedure well.    ASSESSMENT & PLAN:   Primary osteoarthritis of left shoulder Limited ER so concerning for adhesive capsulitis but ultrasound revealing for effusion so more suggestive of OA.  - GH injection  - counseled on HEP and supportive care - xray  - consider PT

## 2019-03-20 ENCOUNTER — Encounter: Payer: Self-pay | Admitting: Family Medicine

## 2019-03-20 ENCOUNTER — Telehealth: Payer: Self-pay | Admitting: Family Medicine

## 2019-03-20 DIAGNOSIS — M19012 Primary osteoarthritis, left shoulder: Secondary | ICD-10-CM | POA: Insufficient documentation

## 2019-03-20 NOTE — Telephone Encounter (Signed)
Informed of results.   Myra Rude, MD [facility] 03/20/2019, 10:38 AM

## 2019-03-20 NOTE — Assessment & Plan Note (Signed)
Limited ER so concerning for adhesive capsulitis but ultrasound revealing for effusion so more suggestive of OA.  - GH injection  - counseled on HEP and supportive care - xray  - consider PT

## 2019-04-22 ENCOUNTER — Ambulatory Visit: Payer: BC Managed Care – PPO

## 2019-05-16 ENCOUNTER — Other Ambulatory Visit: Payer: Self-pay

## 2019-05-16 ENCOUNTER — Encounter (INDEPENDENT_AMBULATORY_CARE_PROVIDER_SITE_OTHER): Payer: Self-pay | Admitting: Family Medicine

## 2019-05-16 ENCOUNTER — Ambulatory Visit (INDEPENDENT_AMBULATORY_CARE_PROVIDER_SITE_OTHER): Payer: BC Managed Care – PPO | Admitting: Family Medicine

## 2019-05-16 VITALS — BP 112/74 | HR 81 | Temp 97.9°F | Ht 66.0 in | Wt 291.0 lb

## 2019-05-16 DIAGNOSIS — Z9189 Other specified personal risk factors, not elsewhere classified: Secondary | ICD-10-CM | POA: Diagnosis not present

## 2019-05-16 DIAGNOSIS — Z6841 Body Mass Index (BMI) 40.0 and over, adult: Secondary | ICD-10-CM

## 2019-05-16 DIAGNOSIS — E7849 Other hyperlipidemia: Secondary | ICD-10-CM | POA: Diagnosis not present

## 2019-05-16 DIAGNOSIS — E559 Vitamin D deficiency, unspecified: Secondary | ICD-10-CM | POA: Diagnosis not present

## 2019-05-16 DIAGNOSIS — R7303 Prediabetes: Secondary | ICD-10-CM | POA: Diagnosis not present

## 2019-05-16 MED ORDER — METFORMIN HCL 500 MG PO TABS: 500.0000 mg | ORAL_TABLET | Freq: Every day | ORAL | 0 refills | Status: DC

## 2019-05-16 MED ORDER — VITAMIN D (ERGOCALCIFEROL) 1.25 MG (50000 UNIT) PO CAPS: 50000.0000 [IU] | ORAL_CAPSULE | ORAL | 0 refills | Status: DC

## 2019-05-16 NOTE — Progress Notes (Signed)
Chief Complaint:   OBESITY Laura [name] is here to discuss her progress with her obesity treatment plan along with follow-up of her obesity related diagnoses. Laura [name] is on the Category 2 Plan and states she is following her eating plan approximately 0% of the time. Laura [name] states she is bike riding for 30 minutes 2-3 times per week.  Today's visit was #: 62 Starting weight: 278 lbs Starting date: 01/17/2018 Today's weight: 291 lbs Today's date: 05/16/2019 Total lbs lost to date: 0 Total lbs lost since last in-office visit: 0  Interim History: Laura [name]'s last in office visit was 7 months ago. She has gained 9 lbs but she is ready to get back on track. She wants to lose 20 lbs in the next 3 months before her knee surgery.  Subjective:   1. Pre-diabetes Laura [name] is off metformin, and she is due to have labs.  2. Vitamin D deficiency Laura [name] is off Vit D, and she is due to have labs.  3. Other hyperlipidemia Laura [name]'s LDL was mildly elevated. She is not on statin and she is due to have labs.  4. At risk for diabetes mellitus Laura [name] is at higher than average risk for developing diabetes due to her obesity.   Assessment/Plan:   1. Pre-diabetes Laura [name] will continue to work on weight loss, diet, exercise, and decreasing simple carbohydrates to help decrease the risk of diabetes. We will check labs today. Laura [name] agreed to restart metformin 500 mg daily with no refills.  - Comprehensive metabolic panel - Hemoglobin A1c - Insulin, random - metFORMIN (GLUCOPHAGE) 500 MG tablet; Take 1 tablet (500 mg total) by mouth daily with breakfast.  Dispense: 30 tablet; Refill: 0  2. Vitamin D deficiency Low Vitamin D level contributes to fatigue and are associated with obesity, breast, and colon cancer. We will refill prescription Vitamin D for 1 month. We will check labs today. Laura [name] will follow-up for routine testing of Vitamin D, at least 2-3 times per year to avoid over-replacement.  - VITAMIN D 25 Hydroxy (Vit-D  Deficiency, Fractures) - Vitamin D, Ergocalciferol, (DRISDOL) 1.25 MG (50000 UNIT) CAPS capsule; Take 1 capsule (50,000 Units total) by mouth every 7 (seven) days.  Dispense: 4 capsule; Refill: 0  3. Other hyperlipidemia Cardiovascular risk and specific lipid/LDL goals reviewed. We discussed several lifestyle modifications today. Laura [name] will restart her diet, and will continue to work on exercise and weight loss efforts. We will check labs today. Orders and follow up as documented in patient record.   - Lipid Panel With LDL/HDL Ratio  4. At risk for diabetes mellitus Laura [name] was given approximately 15 minutes of diabetes education and counseling today. We discussed intensive lifestyle modifications today with an emphasis on weight loss as well as increasing exercise and decreasing simple carbohydrates in her diet. We also reviewed medication options with an emphasis on risk versus benefit of those discussed.   Repetitive spaced learning was employed today to elicit superior memory formation and behavioral change.  5. Class 3 severe obesity with serious comorbidity and body mass index (BMI) of 45.0 to 49.9 in adult, unspecified obesity type (HCC) Laura [name] is currently in the action stage of change. As such, her goal is to get back to weightloss efforts . She has agreed to following a lower carbohydrate, vegetable and lean protein rich diet plan.   Exercise goals: Laura [name] is to continue her current exercise regimen as is.  Behavioral modification strategies: increasing water intake.  Laura [name] has agreed to follow-up with our  clinic in 2 to 3 weeks. She was informed of the importance of frequent follow-up visits to maximize her success with intensive lifestyle modifications for her multiple health conditions.   Laura [name] was informed we would discuss her lab results at her next visit unless there is a critical issue that needs to be addressed sooner. Laura [name] agreed to keep her next visit at the agreed upon time to  discuss these results.  Objective:   Blood pressure 112/74, pulse 81, temperature 97.9 F (36.6 C), temperature source Oral, height 5\' 6"  (1.676 m), weight 291 lb (132 kg), SpO2 96 %. Body mass index is 46.97 kg/m.  General: Cooperative, alert, well developed, in no acute distress. HEENT: Conjunctivae and lids unremarkable. Cardiovascular: Regular rhythm.  Lungs: Normal work of breathing. Neurologic: No focal deficits.   Lab Results  Component Value Date   CREATININE 0.98 11/12/2018   BUN 29 (H) 11/12/2018   NA 136 11/12/2018   K 4.3 11/12/2018   CL 96 11/12/2018   CO2 28 11/12/2018   Lab Results  Component Value Date   ALT 13 11/12/2018   AST 16 11/12/2018   ALKPHOS 77 11/12/2018   BILITOT 0.3 11/12/2018   Lab Results  Component Value Date   HGBA1C 5.7 (H) 11/12/2018   HGBA1C 5.9 (H) 06/21/2018   HGBA1C 5.8 (H) 01/17/2018   Lab Results  Component Value Date   INSULIN 11.8 11/12/2018   INSULIN 13.9 06/21/2018   INSULIN 8.4 01/17/2018   Lab Results  Component Value Date   TSH 1.190 01/17/2018   Lab Results  Component Value Date   CHOL 180 06/21/2018   HDL 68 06/21/2018   LDLCALC 101 (H) 06/21/2018   TRIG 53 06/21/2018   Lab Results  Component Value Date   WBC 8.6 01/17/2018   HGB 12.1 01/17/2018   HCT 37.4 01/17/2018   MCV 89 01/17/2018   PLT 289 07/04/2017   Lab Results  Component Value Date   IRON 41 (L) 11/20/2008   TIBC 319 11/20/2008   FERRITIN 29 11/20/2008   Attestation Statements:   Reviewed by clinician on day of visit: allergies, medications, problem list, medical history, surgical history, family history, social history, and previous encounter notes.   I, 01/20/2009, am acting as transcriptionist for Burt Knack, MD.  I have reviewed the above documentation for accuracy and completeness, and I agree with the above. -  Quillian Quince, MD

## 2019-05-17 LAB — COMPREHENSIVE METABOLIC PANEL
ALT: 10 IU/L (ref 0–32)
AST: 15 IU/L (ref 0–40)
Albumin/Globulin Ratio: 0.9 — ABNORMAL LOW (ref 1.2–2.2)
Albumin: 3.8 g/dL (ref 3.8–4.8)
Alkaline Phosphatase: 79 IU/L (ref 39–117)
BUN/Creatinine Ratio: 25 (ref 12–28)
BUN: 23 mg/dL (ref 8–27)
Bilirubin Total: 0.3 mg/dL (ref 0.0–1.2)
CO2: 26 mmol/L (ref 20–29)
Calcium: 9.5 mg/dL (ref 8.7–10.3)
Chloride: 97 mmol/L (ref 96–106)
Creatinine, Ser: 0.91 mg/dL (ref 0.57–1.00)
GFR calc Af Amer: 79 mL/min/{1.73_m2} (ref >59)
GFR calc non Af Amer: 68 mL/min/{1.73_m2} (ref >59)
Globulin, Total: 4.1 g/dL (ref 1.5–4.5)
Glucose: 89 mg/dL (ref 65–99)
Potassium: 4.5 mmol/L (ref 3.5–5.2)
Sodium: 137 mmol/L (ref 134–144)
Total Protein: 7.9 g/dL (ref 6.0–8.5)

## 2019-05-17 LAB — VITAMIN D 25 HYDROXY (VIT D DEFICIENCY, FRACTURES): Vit D, 25-Hydroxy: 37.4 ng/mL (ref 30.0–100.0)

## 2019-05-17 LAB — LIPID PANEL WITH LDL/HDL RATIO
Cholesterol, Total: 181 mg/dL (ref 100–199)
HDL: 78 mg/dL (ref >39)
LDL Chol Calc (NIH): 94 mg/dL (ref 0–99)
LDL/HDL Ratio: 1.2 ratio (ref 0.0–3.2)
Triglycerides: 46 mg/dL (ref 0–149)
VLDL Cholesterol Cal: 9 mg/dL (ref 5–40)

## 2019-05-17 LAB — INSULIN, RANDOM: INSULIN: 15.1 u[IU]/mL (ref 2.6–24.9)

## 2019-05-17 LAB — HEMOGLOBIN A1C
Est. average glucose Bld gHb Est-mCnc: 123 mg/dL
Hgb A1c MFr Bld: 5.9 % — ABNORMAL HIGH (ref 4.8–5.6)

## 2019-05-21 ENCOUNTER — Encounter (INDEPENDENT_AMBULATORY_CARE_PROVIDER_SITE_OTHER): Payer: Self-pay

## 2019-05-27 ENCOUNTER — Ambulatory Visit: Payer: BC Managed Care – PPO

## 2019-06-06 ENCOUNTER — Ambulatory Visit (INDEPENDENT_AMBULATORY_CARE_PROVIDER_SITE_OTHER): Payer: BC Managed Care – PPO | Admitting: Physician Assistant

## 2019-06-11 ENCOUNTER — Ambulatory Visit (INDEPENDENT_AMBULATORY_CARE_PROVIDER_SITE_OTHER): Payer: BC Managed Care – PPO | Admitting: Family Medicine

## 2019-06-11 ENCOUNTER — Encounter (INDEPENDENT_AMBULATORY_CARE_PROVIDER_SITE_OTHER): Payer: Self-pay | Admitting: Family Medicine

## 2019-06-11 ENCOUNTER — Other Ambulatory Visit: Payer: Self-pay

## 2019-06-11 VITALS — BP 112/73 | HR 77 | Ht 66.0 in | Wt 279.0 lb

## 2019-06-11 DIAGNOSIS — R7303 Prediabetes: Secondary | ICD-10-CM

## 2019-06-11 DIAGNOSIS — E559 Vitamin D deficiency, unspecified: Secondary | ICD-10-CM | POA: Diagnosis not present

## 2019-06-11 DIAGNOSIS — Z6841 Body Mass Index (BMI) 40.0 and over, adult: Secondary | ICD-10-CM

## 2019-06-11 DIAGNOSIS — I10 Essential (primary) hypertension: Secondary | ICD-10-CM | POA: Diagnosis not present

## 2019-06-11 DIAGNOSIS — Z9189 Other specified personal risk factors, not elsewhere classified: Secondary | ICD-10-CM

## 2019-06-11 MED ORDER — VITAMIN D (ERGOCALCIFEROL) 1.25 MG (50000 UNIT) PO CAPS: 50000.0000 [IU] | ORAL_CAPSULE | ORAL | 0 refills | Status: DC

## 2019-06-11 MED ORDER — METFORMIN HCL 500 MG PO TABS: 500.0000 mg | ORAL_TABLET | Freq: Two times a day (BID) | ORAL | 0 refills | Status: DC

## 2019-06-11 NOTE — Progress Notes (Signed)
Chief Complaint:   OBESITY Laura [name] is here to discuss her progress with her obesity treatment plan along with follow-up of her obesity related diagnoses. Laura [name] is on following a lower carbohydrate, vegetable and lean protein rich diet plan and states she is following her eating plan approximately 94% of the time. Laura [name] states she is riding the bike for 30 minutes 2-3 times per week.  Today's visit was #: 68 Starting weight: 278 lbs Starting date: 01/17/2018 Today's weight: 279 lbs Today's date: 06/11/2019 Total lbs lost to date: 0 Total lbs lost since last in-office visit: 12  Interim History: Laura [name] followed the Low carbohydrate plan for the first 2 weeks, then added back in some fruit this last week. She estimates to drink >64 oz of water per day. She recently saw Orthopedic surgeon for right total knee replacement. He requires her BMI to be <40 prior to total joint replacement. Her surgery is planned for the end of June 2021. She would like to alternate between Low carb and Category 2.  Subjective:   1. Vitamin D deficiency Laura [name]'s Vit D level on 05/16/2019 was 37.4. She is currently on prescription strength Vit D supplementation. I discussed labs with the patient today.  2. Pre-diabetes Laura [name]'s level is worsening. Her A1c on 05/16/2019 was 5.9. she is tolerating metformin well and denies GI side effects. I discussed labs with the patient.  3. Essential hypertension Laura [name]'s blood pressure is well controlled on lisinopril-hydrochlorothiazide 20-25 mg q daily. Her recent CMP is stable.  4. At risk for heart disease Laura [name] is at a higher than average risk for cardiovascular disease due to obesity, hypertension, and pre-diabetes.   Assessment/Plan:   1. Vitamin D deficiency Low Vitamin D level contributes to fatigue and are associated with obesity, breast, and colon cancer. We will refill prescription Vitamin D for 1 month. Laura [name] will follow-up for routine testing of Vitamin D, at least 2-3 times  per year to avoid over-replacement.  - Vitamin D, Ergocalciferol, (DRISDOL) 1.25 MG (50000 UNIT) CAPS capsule; Take 1 capsule (50,000 Units total) by mouth every 7 (seven) days.  Dispense: 4 capsule; Refill: 0  2. Pre-diabetes Laura [name] will continue to work on weight loss, exercise, and decreasing simple carbohydrates to help decrease the risk of diabetes. We will refill metformin for 1 month. We will recheck labs in 3 months.  - metFORMIN (GLUCOPHAGE) 500 MG tablet; Take 1 tablet (500 mg total) by mouth 2 (two) times daily with a meal.  Dispense: 60 tablet; Refill: 0  3. Essential hypertension Laura [name] is working on healthy weight loss and exercise to improve blood pressure control. We will watch for signs of hypotension as she continues her lifestyle modifications. Laura [name] will continue her current anti-hypertensive therapy.  4. At risk for heart disease Laura [name] was given approximately 15 minutes of coronary artery disease prevention counseling today. She is 64 y.o. female and has risk factors for heart disease including obesity, hypertension, and pre-diabetes. We discussed intensive lifestyle modifications today with an emphasis on specific weight loss instructions and strategies.   Repetitive spaced learning was employed today to elicit superior memory formation and behavioral change.  5. Class 3 severe obesity with serious comorbidity and body mass index (BMI) of 45.0 to 49.9 in adult, unspecified obesity type (HCC) Laura [name] is currently in the action stage of change. As such, her goal is to continue with weight loss efforts. She has agreed to the Category 2 Plan or following a lower carbohydrate, vegetable and lean  protein rich diet plan.   Exercise goals: As is.  Behavioral modification strategies: travel eating strategies.  Laura [name] has agreed to follow-up with our clinic in 3 weeks. She was informed of the importance of frequent follow-up visits to maximize her success with intensive lifestyle  modifications for her multiple health conditions.   Objective:   Blood pressure 112/73, pulse 77, height 5\' 6"  (1.676 m), weight 279 lb (126.6 kg), SpO2 99 %. Body mass index is 45.03 kg/m.  General: Cooperative, alert, well developed, in no acute distress. HEENT: Conjunctivae and lids unremarkable. Cardiovascular: Regular rhythm.  Lungs: Normal work of breathing. Neurologic: No focal deficits.   Lab Results  Component Value Date   CREATININE 0.91 05/16/2019   BUN 23 05/16/2019   NA 137 05/16/2019   K 4.5 05/16/2019   CL 97 05/16/2019   CO2 26 05/16/2019   Lab Results  Component Value Date   ALT 10 05/16/2019   AST 15 05/16/2019   ALKPHOS 79 05/16/2019   BILITOT 0.3 05/16/2019   Lab Results  Component Value Date   HGBA1C 5.9 (H) 05/16/2019   HGBA1C 5.7 (H) 11/12/2018   HGBA1C 5.9 (H) 06/21/2018   HGBA1C 5.8 (H) 01/17/2018   Lab Results  Component Value Date   INSULIN 15.1 05/16/2019   INSULIN 11.8 11/12/2018   INSULIN 13.9 06/21/2018   INSULIN 8.4 01/17/2018   Lab Results  Component Value Date   TSH 1.190 01/17/2018   Lab Results  Component Value Date   CHOL 181 05/16/2019   HDL 78 05/16/2019   LDLCALC 94 05/16/2019   TRIG 46 05/16/2019   Lab Results  Component Value Date   WBC 8.6 01/17/2018   HGB 12.1 01/17/2018   HCT 37.4 01/17/2018   MCV 89 01/17/2018   PLT 289 07/04/2017   Lab Results  Component Value Date   IRON 41 (L) 11/20/2008   TIBC 319 11/20/2008   FERRITIN 29 11/20/2008   Attestation Statements:   Reviewed by clinician on day of visit: allergies, medications, problem list, medical history, surgical history, family history, social history, and previous encounter notes.   I, 01/20/2009, am acting as transcriptionist for Burt Knack, MD.  I have reviewed the above documentation for accuracy and completeness, and I agree with the above. -  Laura Quince, MD

## 2019-07-04 ENCOUNTER — Ambulatory Visit (INDEPENDENT_AMBULATORY_CARE_PROVIDER_SITE_OTHER): Payer: BC Managed Care – PPO | Admitting: Family Medicine

## 2019-07-04 ENCOUNTER — Encounter (INDEPENDENT_AMBULATORY_CARE_PROVIDER_SITE_OTHER): Payer: Self-pay | Admitting: Family Medicine

## 2019-07-04 ENCOUNTER — Other Ambulatory Visit: Payer: Self-pay

## 2019-07-04 VITALS — BP 117/81 | HR 82 | Temp 97.6°F | Ht 66.0 in | Wt 279.0 lb

## 2019-07-04 DIAGNOSIS — E559 Vitamin D deficiency, unspecified: Secondary | ICD-10-CM

## 2019-07-04 DIAGNOSIS — Z6841 Body Mass Index (BMI) 40.0 and over, adult: Secondary | ICD-10-CM | POA: Diagnosis not present

## 2019-07-04 DIAGNOSIS — R7303 Prediabetes: Secondary | ICD-10-CM

## 2019-07-08 MED ORDER — METFORMIN HCL 500 MG PO TABS: 500.0000 mg | ORAL_TABLET | Freq: Two times a day (BID) | ORAL | 0 refills | Status: DC

## 2019-07-08 MED ORDER — VITAMIN D (ERGOCALCIFEROL) 1.25 MG (50000 UNIT) PO CAPS: 50000.0000 [IU] | ORAL_CAPSULE | ORAL | 0 refills | Status: DC

## 2019-07-09 ENCOUNTER — Other Ambulatory Visit: Payer: Self-pay

## 2019-07-09 ENCOUNTER — Ambulatory Visit
Admission: RE | Admit: 2019-07-09 | Discharge: 2019-07-09 | Disposition: A | Discharge status: Home / Self Care | Payer: BC Managed Care – PPO | Source: Ambulatory Visit | Attending: Family Medicine | Admitting: Family Medicine

## 2019-07-09 DIAGNOSIS — Z1231 Encounter for screening mammogram for malignant neoplasm of breast: Secondary | ICD-10-CM

## 2019-07-09 NOTE — Progress Notes (Signed)
Chief Complaint:   OBESITY Laura [name] is here to discuss her progress with her obesity treatment plan along with follow-up of her obesity related diagnoses. Cate is on the Category 2 Plan or following a lower carbohydrate, vegetable and lean protein rich diet plan and states she is following her eating plan approximately 90% of the time. Stanislawa states she is bike riding and exercises for 30 minutes 3 times per week.  Today's visit was #: 17 Starting weight: 278 lbs Starting date: 01/17/2018 Today's weight: 279 lbs Today's date: 07/04/2019 Total lbs lost to date: 0 Total lbs lost since last in-office visit: 0  Interim History: Laura [name] is frustrated as she is doing well with following her Category 2 plan and Low carbohydrate plan, but she is still not losing weight. She needs to get her BMI below 40 to have knee replacement surgery, which is scheduled in June.  Subjective:   1. Vitamin D deficiency Laura [name] is stable on Vit D, and she denies nausea, vomiting, or muscle weakness. Last Vit D level was not yet at goal.  2. Pre-diabetes Laura [name] is doing well decreasing simple carbohydrates and increasing lean protein, but she is still struggling with weight loss.  Assessment/Plan:   1. Vitamin D deficiency Low Vitamin D level contributes to fatigue and are associated with obesity, breast, and colon cancer. We will refill prescription Vitamin D for 1 month. Moana will follow-up for routine testing of Vitamin D, at least 2-3 times per year to avoid over-replacement.  - Vitamin D, Ergocalciferol, (DRISDOL) 1.25 MG (50000 UNIT) CAPS capsule; Take 1 capsule (50,000 Units total) by mouth every 7 (seven) days.  Dispense: 4 capsule; Refill: 0  2. Pre-diabetes Brandyce will continue to work on weight loss, exercise, and decreasing simple carbohydrates to help decrease the risk of diabetes. We will recheck IC at her next visit, and we will refill metformin for 1 month.  - metFORMIN (GLUCOPHAGE) 500 MG tablet; Take 1  tablet (500 mg total) by mouth 2 (two) times daily with a meal.  Dispense: 60 tablet; Refill: 0  3. Class 3 severe obesity with serious comorbidity and body mass index (BMI) of 45.0 to 49.9 in adult, unspecified obesity type (HCC) Laura [name] is currently in the action stage of change. As such, her goal is to continue with weight loss efforts. She has agreed to change to keeping a food journal and adhering to recommended goals of 1000-1200 calories and 75+ grams of protein daily.   Exercise goals: As is.  Behavioral modification strategies: increasing lean protein intake, decreasing liquid calories, planning for success and keeping a strict food journal.  Laura [name] has agreed to follow-up with our clinic in 2 weeks. She was informed of the importance of frequent follow-up visits to maximize her success with intensive lifestyle modifications for her multiple health conditions.   Objective:   Blood pressure 117/81, pulse 82, temperature 97.6 F (36.4 C), temperature source Oral, height 5\' 6"  (1.676 m), weight 279 lb (126.6 kg), SpO2 91 %. Body mass index is 45.03 kg/m.  General: Cooperative, alert, well developed, in no acute distress. HEENT: Conjunctivae and lids unremarkable. Cardiovascular: Regular rhythm.  Lungs: Normal work of breathing. Neurologic: No focal deficits.   Lab Results  Component Value Date   CREATININE 0.91 05/16/2019   BUN 23 05/16/2019   NA 137 05/16/2019   K 4.5 05/16/2019   CL 97 05/16/2019   CO2 26 05/16/2019   Lab Results  Component Value Date   ALT  10 05/16/2019   AST 15 05/16/2019   ALKPHOS 79 05/16/2019   BILITOT 0.3 05/16/2019   Lab Results  Component Value Date   HGBA1C 5.9 (H) 05/16/2019   HGBA1C 5.7 (H) 11/12/2018   HGBA1C 5.9 (H) 06/21/2018   HGBA1C 5.8 (H) 01/17/2018   Lab Results  Component Value Date   INSULIN 15.1 05/16/2019   INSULIN 11.8 11/12/2018   INSULIN 13.9 06/21/2018   INSULIN 8.4 01/17/2018   Lab Results  Component Value Date    TSH 1.190 01/17/2018   Lab Results  Component Value Date   CHOL 181 05/16/2019   HDL 78 05/16/2019   LDLCALC 94 05/16/2019   TRIG 46 05/16/2019   Lab Results  Component Value Date   WBC 8.6 01/17/2018   HGB 12.1 01/17/2018   HCT 37.4 01/17/2018   MCV 89 01/17/2018   PLT 289 07/04/2017   Lab Results  Component Value Date   IRON 41 (L) 11/20/2008   TIBC 319 11/20/2008   FERRITIN 29 11/20/2008   Attestation Statements:   Reviewed by clinician on day of visit: allergies, medications, problem list, medical history, surgical history, family history, social history, and previous encounter notes.   I, Trixie Dredge, am acting as transcriptionist for Dennard Nip, MD.  I have reviewed the above documentation for accuracy and completeness, and I agree with the above. -   Dennard Nip, MD

## 2019-07-18 ENCOUNTER — Ambulatory Visit (INDEPENDENT_AMBULATORY_CARE_PROVIDER_SITE_OTHER): Payer: BC Managed Care – PPO | Admitting: Family Medicine

## 2019-07-22 ENCOUNTER — Other Ambulatory Visit: Payer: Self-pay

## 2019-07-22 ENCOUNTER — Encounter (INDEPENDENT_AMBULATORY_CARE_PROVIDER_SITE_OTHER): Payer: Self-pay | Admitting: Family Medicine

## 2019-07-22 ENCOUNTER — Ambulatory Visit (INDEPENDENT_AMBULATORY_CARE_PROVIDER_SITE_OTHER): Payer: BC Managed Care – PPO | Admitting: Family Medicine

## 2019-07-22 VITALS — BP 104/72 | HR 84 | Temp 97.4°F | Ht 66.0 in | Wt 274.0 lb

## 2019-07-22 DIAGNOSIS — R7303 Prediabetes: Secondary | ICD-10-CM

## 2019-07-22 DIAGNOSIS — Z9189 Other specified personal risk factors, not elsewhere classified: Secondary | ICD-10-CM | POA: Diagnosis not present

## 2019-07-22 DIAGNOSIS — E559 Vitamin D deficiency, unspecified: Secondary | ICD-10-CM

## 2019-07-22 DIAGNOSIS — Z6841 Body Mass Index (BMI) 40.0 and over, adult: Secondary | ICD-10-CM

## 2019-07-22 MED ORDER — VITAMIN D (ERGOCALCIFEROL) 1.25 MG (50000 UNIT) PO CAPS: 50000.0000 [IU] | ORAL_CAPSULE | ORAL | 0 refills | Status: DC

## 2019-07-22 MED ORDER — METFORMIN HCL 500 MG PO TABS: 500.0000 mg | ORAL_TABLET | Freq: Two times a day (BID) | ORAL | 0 refills | Status: DC

## 2019-07-22 NOTE — Progress Notes (Signed)
Chief Complaint:   OBESITY Laura [name] is here to discuss her progress with her obesity treatment plan along with follow-up of her obesity related diagnoses. Laura [name] is on keeping a food journal and adhering to recommended goals of 1000-1200 calories and 75+ grams of protein daily and states she is following her eating plan approximately 90% of the time. Laura [name] states she is doing 0 minutes 0 times per week.  Today's visit was #: 18 Starting weight: 278 lbs Starting date: 01/17/2018 Today's weight: 274 lbs Today's date: 07/22/2019 Total lbs lost to date: 4 Total lbs lost since last in-office visit: 5  Interim History: Laura [name] has a total right knee replacement scheduled on 09/02/2019. She reports feeling better since consistently following her Category 2 meal plan. She has been unable to exercise due to chronic right knee pain.  Subjective:   1. Vitamin D deficiency Laura [name]'s Vit D level on 05/16/2019 was 37.4. She is on prescription strength supplementation, and she is tolerating it well.  2. Pre-diabetes Laura [name]'s A1c on 05/16/2019 was 5.9. She is on metformin 500 mg BID with meals, and is tolerating it well.  3. At risk for osteoporosis Laura [name] is at higher risk of osteopenia and osteoporosis due to Vitamin D deficiency and obesity.   Assessment/Plan:   1. Vitamin D deficiency Low Vitamin D level contributes to fatigue and are associated with obesity, breast, and colon cancer. We will refill prescription Vitamin D for 1 month. Laura [name] will follow-up for routine testing of Vitamin D, at least 2-3 times per year to avoid over-replacement. We will recheck labs at her next office visit.  - Vitamin D, Ergocalciferol, (DRISDOL) 1.25 MG (50000 UNIT) CAPS capsule; Take 1 capsule (50,000 Units total) by mouth every 7 (seven) days.  Dispense: 4 capsule; Refill: 0  2. Pre-diabetes Laura [name] will continue her Category 2 meal plan, and continue to work on weight loss, exercise, and decreasing simple carbohydrates to help  decrease the risk of diabetes. We will refill metformin for 1 month.  - metFORMIN (GLUCOPHAGE) 500 MG tablet; Take 1 tablet (500 mg total) by mouth 2 (two) times daily with a meal.  Dispense: 60 tablet; Refill: 0  3. At risk for osteoporosis Laura [name] was given approximately 15 minutes of osteoporosis prevention counseling today. Laura [name] is at risk for osteopenia and osteoporosis due to her Vitamin D deficiency. She was encouraged to take her Vitamin D and follow her higher calcium diet and increase strengthening exercise to help strengthen her bones and decrease her risk of osteopenia and osteoporosis.  Repetitive spaced learning was employed today to elicit superior memory formation and behavioral change.  4. Class 3 severe obesity with serious comorbidity and body mass index (BMI) of 40.0 to 44.9 in adult, unspecified obesity type (HCC) Laura [name] is currently in the action stage of change. As such, her goal is to continue with weight loss efforts. She has agreed to the Category 2 Plan.   Exercise goals: No exercise has been prescribed at this time.  Behavioral modification strategies: increasing lean protein intake, decreasing simple carbohydrates and no skipping meals.  Laura [name] has agreed to follow-up with our clinic in 2 to 3 weeks. She was informed of the importance of frequent follow-up visits to maximize her success with intensive lifestyle modifications for her multiple health conditions.   Objective:   Blood pressure 104/72, pulse 84, temperature (!) 97.4 F (36.3 C), temperature source Oral, height 5\' 6"  (1.676 m), weight 274 lb (124.3 kg), SpO2 97 %. Body  mass index is 44.22 kg/m.  General: Cooperative, alert, well developed, in no acute distress. HEENT: Conjunctivae and lids unremarkable. Cardiovascular: Regular rhythm.  Lungs: Normal work of breathing. Neurologic: No focal deficits.   Lab Results  Component Value Date   CREATININE 0.91 05/16/2019   BUN 23 05/16/2019   NA 137  05/16/2019   K 4.5 05/16/2019   CL 97 05/16/2019   CO2 26 05/16/2019   Lab Results  Component Value Date   ALT 10 05/16/2019   AST 15 05/16/2019   ALKPHOS 79 05/16/2019   BILITOT 0.3 05/16/2019   Lab Results  Component Value Date   HGBA1C 5.9 (H) 05/16/2019   HGBA1C 5.7 (H) 11/12/2018   HGBA1C 5.9 (H) 06/21/2018   HGBA1C 5.8 (H) 01/17/2018   Lab Results  Component Value Date   INSULIN 15.1 05/16/2019   INSULIN 11.8 11/12/2018   INSULIN 13.9 06/21/2018   INSULIN 8.4 01/17/2018   Lab Results  Component Value Date   TSH 1.190 01/17/2018   Lab Results  Component Value Date   CHOL 181 05/16/2019   HDL 78 05/16/2019   LDLCALC 94 05/16/2019   TRIG 46 05/16/2019   Lab Results  Component Value Date   WBC 8.6 01/17/2018   HGB 12.1 01/17/2018   HCT 37.4 01/17/2018   MCV 89 01/17/2018   PLT 289 07/04/2017   Lab Results  Component Value Date   IRON 41 (L) 11/20/2008   TIBC 319 11/20/2008   FERRITIN 29 11/20/2008   Attestation Statements:   Reviewed by clinician on day of visit: allergies, medications, problem list, medical history, surgical history, family history, social history, and previous encounter notes.   I, Burt Knack, am acting as transcriptionist for Quillian Quince, MD.  I have reviewed the above documentation for accuracy and completeness, and I agree with the above. -  Quillian Quince, MD

## 2019-07-24 ENCOUNTER — Ambulatory Visit (INDEPENDENT_AMBULATORY_CARE_PROVIDER_SITE_OTHER): Payer: BC Managed Care – PPO | Admitting: Family Medicine

## 2019-08-14 ENCOUNTER — Ambulatory Visit (INDEPENDENT_AMBULATORY_CARE_PROVIDER_SITE_OTHER): Payer: BC Managed Care – PPO | Admitting: Family Medicine

## 2019-08-23 NOTE — Patient Instructions (Addendum)
DUE TO COVID-19 ONLY ONE VISITOR IS ALLOWED TO COME WITH YOU AND STAY IN THE WAITING ROOM ONLY DURING PRE OP AND PROCEDURE DAY OF SURGERY. THE 1 VISITOR MAY VISIT WITH YOU AFTER SURGERY IN YOUR PRIVATE ROOM DURING VISITING HOURS ONLY!  YOU NEED TO HAVE A COVID 19 TEST ON 08-29-19 @2 :55 PM, THIS TEST MUST BE DONE BEFORE SURGERY, COME  [address], [place] , .  [facility]) ONCE YOUR COVID TEST IS COMPLETED, PLEASE BEGIN THE QUARANTINE INSTRUCTIONS AS OUTLINED IN YOUR HANDOUT.                Laura [name]  08/23/2019   Your procedure is scheduled on: 09-02-19   Report to [facility] Main  Entrance    Report to Admitting at 5:50 AM    Call this number if you have problems the morning of surgery [phone]    Remember: AFTER MIDNIGHT THE NIGHT PRIOR TO SURGERY. NOTHING BY MOUTH EXCEPT CLEAR LIQUIDS UNTIL 5:20 AM. PLEASE FINISH G2 DRINK PER SURGEON ORDER  WHICH NEEDS TO BE COMPLETED AT 5:20 AM .   CLEAR LIQUID DIET   Foods Allowed                                                                     Foods Excluded  Coffee and tea, regular and decaf                             liquids that you cannot  Plain Jell-O any favor except red or purple                                           see through such as: Fruit ices (not with fruit pulp)                                     milk, soups, orange juice  Iced Popsicles                                    All solid food Carbonated beverages, regular and diet                                    Cranberry, grape and apple juices Sports drinks like Gatorade Lightly seasoned clear broth or consume(fat free) Sugar, honey syrup   _____________________________________________________________________       Take these medicines the morning of surgery with A SIP OF WATER: None  BRUSH YOUR TEETH MORNING OF SURGERY AND RINSE YOUR MOUTH OUT, NO CHEWING GUM CANDY OR MINTS.   DO NOT TAKE ANY DIABETIC MEDICATIONS  DAY OF YOUR SURGERY                               You may not have any metal  on your body including hair pins and              piercings    Do not wear jewelry, make-up, lotions, powders or perfumes, deodorant              Do not wear nail polish on your fingernails.  Do not shave  48 hours prior to surgery.                  Do not bring valuables to the hospital. [facility] IS NOT             RESPONSIBLE   FOR VALUABLES.  Contacts, dentures or bridgework may not be worn into surgery.  You may bring a small overnight bag     Special Instructions: N/A              Please read over the following fact sheets you were given: _____________________________________________________________________  How to Manage Your Diabetes Before and After Surgery  Why is it important to control my blood sugar before and after surgery? . Improving blood sugar levels before and after surgery helps healing and can limit problems. . A way of improving blood sugar control is eating a healthy diet by: o  Eating less sugar and carbohydrates o  Increasing activity/exercise o  Talking with your doctor about reaching your blood sugar goals . High blood sugars (greater than 180 mg/dL) can raise your risk of infections and slow your recovery, so you will need to focus on controlling your diabetes during the weeks before surgery. . Make sure that the doctor who takes care of your diabetes knows about your planned surgery including the date and location.  How do I manage my blood sugar before surgery? . Check your blood sugar at least 4 times a day, starting 2 days before surgery, to make sure that the level is not too high or low. o Check your blood sugar the morning of your surgery when you wake up and every 2 hours until you get to the Short Stay unit. . If your blood sugar is less than 70 mg/dL, you will need to treat for low blood sugar: o Do not take insulin. o Treat a low blood sugar (less than 70 mg/dL)  with  cup of clear juice (cranberry or apple), 4 glucose tablets, OR glucose gel. o Recheck blood sugar in 15 minutes after treatment (to make sure it is greater than 70 mg/dL). If your blood sugar is not greater than 70 mg/dL on recheck, call [phone] for further instructions. . Report your blood sugar to the short stay nurse when you get to Short Stay.  . If you are admitted to the hospital after surgery: o Your blood sugar will be checked by the staff and you will probably be given insulin after surgery (instead of oral diabetes medicines) to make sure you have good blood sugar levels. o The goal for blood sugar control after surgery is 80-180 mg/dL.   WHAT DO I DO ABOUT MY DIABETES MEDICATION?  Marland Kitchen Do not take oral diabetes medicines (pills) the morning of surgery.  . THE DAY BEFORE SURGERY, take your usual Metformin        Reviewed and Endorsed by [facility] Patient Education Committee, August 2015           [facility] - Preparing for Surgery Before surgery, you can play an important role.  Because skin is not sterile, your skin needs to be as free  of germs as possible.  You can reduce the number of germs on your skin by washing with CHG (chlorahexidine gluconate) soap before surgery.  CHG is an antiseptic cleaner which kills germs and bonds with the skin to continue killing germs even after washing. Please DO NOT use if you have an allergy to CHG or antibacterial soaps.  If your skin becomes reddened/irritated stop using the CHG and inform your nurse when you arrive at Short Stay. Do not shave (including legs and underarms) for at least 48 hours prior to the first CHG shower.  You may shave your face/neck. Please follow these instructions carefully:  1.  Shower with CHG Soap the night before surgery and the  morning of Surgery.  2.  If you choose to wash your hair, wash your hair first as usual with your  normal  shampoo.  3.  After you shampoo, rinse your hair and body  thoroughly to remove the  shampoo.                           4.  Use CHG as you would any other liquid soap.  You can apply chg directly  to the skin and wash                       Gently with a scrungie or clean washcloth.  5.  Apply the CHG Soap to your body ONLY FROM THE NECK DOWN.   Do not use on face/ open                           Wound or open sores. Avoid contact with eyes, ears mouth and genitals (private parts).                       Wash face,  Genitals (private parts) with your normal soap.             6.  Wash thoroughly, paying special attention to the area where your surgery  will be performed.  7.  Thoroughly rinse your body with warm water from the neck down.  8.  DO NOT shower/wash with your normal soap after using and rinsing off  the CHG Soap.                9.  Pat yourself dry with a clean towel.            10.  Wear clean pajamas.            11.  Place clean sheets on your bed the night of your first shower and do not  sleep with pets. Day of Surgery : Do not apply any lotions/deodorants the morning of surgery.  Please wear clean clothes to the hospital/surgery center.  FAILURE TO FOLLOW THESE INSTRUCTIONS MAY RESULT IN THE CANCELLATION OF YOUR SURGERY PATIENT SIGNATURE_________________________________  NURSE SIGNATURE__________________________________  ________________________________________________________________________   Laura [name]  An incentive spirometer is a tool that can help keep your lungs clear and active. This tool measures how well you are filling your lungs with each breath. Taking long deep breaths may help reverse or decrease the chance of developing breathing (pulmonary) problems (especially infection) following:  A long period of time when you are unable to move or be active. BEFORE THE PROCEDURE   If the spirometer includes an indicator to show your best effort, your nurse  or respiratory therapist will set it to a desired goal.  If  possible, sit up straight or lean slightly forward. Try not to slouch.  Hold the incentive spirometer in an upright position. INSTRUCTIONS FOR USE  1. Sit on the edge of your bed if possible, or sit up as far as you can in bed or on a chair. 2. Hold the incentive spirometer in an upright position. 3. Breathe out normally. 4. Place the mouthpiece in your mouth and seal your lips tightly around it. 5. Breathe in slowly and as deeply as possible, raising the piston or the ball toward the top of the column. 6. Hold your breath for 3-5 seconds or for as long as possible. Allow the piston or ball to fall to the bottom of the column. 7. Remove the mouthpiece from your mouth and breathe out normally. 8. Rest for a few seconds and repeat Steps 1 through 7 at least 10 times every 1-2 hours when you are awake. Take your time and take a few normal breaths between deep breaths. 9. The spirometer may include an indicator to show your best effort. Use the indicator as a goal to work toward during each repetition. 10. After each set of 10 deep breaths, practice coughing to be sure your lungs are clear. If you have an incision (the cut made at the time of surgery), support your incision when coughing by placing a pillow or rolled up towels firmly against it. Once you are able to get out of bed, walk around indoors and cough well. You may stop using the incentive spirometer when instructed by your caregiver.  RISKS AND COMPLICATIONS  Take your time so you do not get dizzy or light-headed.  If you are in pain, you may need to take or ask for pain medication before doing incentive spirometry. It is harder to take a deep breath if you are having pain. AFTER USE  Rest and breathe slowly and easily.  It can be helpful to keep track of a log of your progress. Your caregiver can provide you with a simple table to help with this. If you are using the spirometer at home, follow these instructions: SEEK MEDICAL CARE  IF:   You are having difficultly using the spirometer.  You have trouble using the spirometer as often as instructed.  Your pain medication is not giving enough relief while using the spirometer.  You develop fever of 100.5 F (38.1 C) or higher. SEEK IMMEDIATE MEDICAL CARE IF:   You cough up bloody sputum that had not been present before.  You develop fever of 102 F (38.9 C) or greater.  You develop worsening pain at or near the incision site. MAKE SURE YOU:   Understand these instructions.  Will watch your condition.  Will get help right away if you are not doing well or get worse. Document Released: 07/11/2006 Document Revised: 05/23/2011 Document Reviewed: 09/11/2006 Exit[employer] Patient Information 2014 [employer], [place].   ________________________________________________________________________  WHAT IS A BLOOD TRANSFUSION? Blood Transfusion Information  A transfusion is the replacement of blood or some of its parts. Blood is made up of multiple cells which provide different functions.  Red blood cells carry oxygen and are used for blood loss replacement.  White blood cells fight against infection.  Platelets control bleeding.  Plasma helps clot blood.  Other blood products are available for specialized needs, such as hemophilia or other clotting disorders. BEFORE THE TRANSFUSION  Who gives blood for transfusions?   Healthy  volunteers who are fully evaluated to make sure their blood is safe. This is blood bank blood. Transfusion therapy is the safest it has ever been in the practice of medicine. Before blood is taken from a donor, a complete history is taken to make sure that person has no history of diseases nor engages in risky social behavior (examples are intravenous drug use or sexual activity with multiple partners). The donor's travel history is screened to minimize risk of transmitting infections, such as malaria. The donated blood is tested for signs of  infectious diseases, such as HIV and hepatitis. The blood is then tested to be sure it is compatible with you in order to minimize the chance of a transfusion reaction. If you or a relative donates blood, this is often done in anticipation of surgery and is not appropriate for emergency situations. It takes many days to process the donated blood. RISKS AND COMPLICATIONS Although transfusion therapy is very safe and saves many lives, the main dangers of transfusion include:   Getting an infectious disease.  Developing a transfusion reaction. This is an allergic reaction to something in the blood you were given. Every precaution is taken to prevent this. The decision to have a blood transfusion has been considered carefully by your caregiver before blood is given. Blood is not given unless the benefits outweigh the risks. AFTER THE TRANSFUSION  Right after receiving a blood transfusion, you will usually feel much better and more energetic. This is especially true if your red blood cells have gotten low (anemic). The transfusion raises the level of the red blood cells which carry oxygen, and this usually causes an energy increase.  The nurse administering the transfusion will monitor you carefully for complications. HOME CARE INSTRUCTIONS  No special instructions are needed after a transfusion. You may find your energy is better. Speak with your caregiver about any limitations on activity for underlying diseases you may have. SEEK MEDICAL CARE IF:   Your condition is not improving after your transfusion.  You develop redness or irritation at the intravenous (IV) site. SEEK IMMEDIATE MEDICAL CARE IF:  Any of the following symptoms occur over the next 12 hours:  Shaking chills.  You have a temperature by mouth above 102 F (38.9 C), not controlled by medicine.  Chest, back, or muscle pain.  People around you feel you are not acting correctly or are confused.  Shortness of breath or  difficulty breathing.  Dizziness and fainting.  You get a rash or develop hives.  You have a decrease in urine output.  Your urine turns a dark color or changes to pink, red, or brown. Any of the following symptoms occur over the next 10 days:  You have a temperature by mouth above 102 F (38.9 C), not controlled by medicine.  Shortness of breath.  Weakness after normal activity.  The white part of the eye turns yellow (jaundice).  You have a decrease in the amount of urine or are urinating less often.  Your urine turns a dark color or changes to pink, red, or brown. Document Released: 02/26/2000 Document Revised: 05/23/2011 Document Reviewed: 10/15/2007 [facility] Patient Information 2014 [place], [place].  _______________________________________________________________________

## 2019-08-23 NOTE — Progress Notes (Addendum)
PCP - Laurann Montana, MD w/clearance on chart dated 08-07-19 Cardiologist -  No stimulator   PPM/ICD -  Device Orders -  Rep Notified -   Chest x-ray -  EKG -  Stress Test -  ECHO -  Cardiac Cath -   Sleep Study -  CPAP -   Metformin being taken for weight lost Fasting Blood Sugar -  Checks Blood Sugar _____ times a day  Blood Thinner Instructions: Xarelto- Prescribed by Dr. Laurann Montana, pt to hold x 1 day Aspirin Instructions:  ERAS Protcol - PRE-SURGERY Ensure or G2-   COVID TEST-  COVID  VACCINATON X 2  Anesthesia review:   Patient denies shortness of breath, fever, cough and chest pain at PAT appointment   All instructions explained to the patient, with a verbal understanding of the material. Patient agrees to go over the instructions while at home for a better understanding. Patient also instructed to self quarantine after being tested for COVID-19. The opportunity to ask questions was provided.

## 2019-08-26 ENCOUNTER — Other Ambulatory Visit: Payer: Self-pay

## 2019-08-26 ENCOUNTER — Encounter (HOSPITAL_COMMUNITY)
Admission: RE | Admit: 2019-08-26 | Discharge: 2019-08-26 | Disposition: A | Discharge status: Home / Self Care | Payer: BC Managed Care – PPO | Source: Ambulatory Visit | Attending: Orthopedic Surgery | Admitting: Orthopedic Surgery

## 2019-08-26 ENCOUNTER — Encounter (HOSPITAL_COMMUNITY): Payer: Self-pay

## 2019-08-26 DIAGNOSIS — Z01818 Encounter for other preprocedural examination: Secondary | ICD-10-CM | POA: Diagnosis not present

## 2019-08-26 LAB — COMPREHENSIVE METABOLIC PANEL
ALT: 17 U/L (ref 0–44)
AST: 21 U/L (ref 15–41)
Albumin: 3.6 g/dL (ref 3.5–5.0)
Alkaline Phosphatase: 53 U/L (ref 38–126)
Anion gap: 9 (ref 5–15)
BUN: 22 mg/dL (ref 8–23)
CO2: 26 mmol/L (ref 22–32)
Calcium: 9 mg/dL (ref 8.9–10.3)
Chloride: 101 mmol/L (ref 98–111)
Creatinine, Ser: 1.01 mg/dL — ABNORMAL HIGH (ref 0.44–1.00)
GFR calc Af Amer: >60 mL/min (ref >60)
GFR calc non Af Amer: >60 mL/min (ref >60)
Glucose, Bld: 112 mg/dL — ABNORMAL HIGH (ref 70–99)
Potassium: 4.1 mmol/L (ref 3.5–5.1)
Sodium: 136 mmol/L (ref 135–145)
Total Bilirubin: 0.6 mg/dL (ref 0.3–1.2)
Total Protein: 8.2 g/dL — ABNORMAL HIGH (ref 6.5–8.1)

## 2019-08-26 LAB — CBC
HCT: 37.3 % (ref 36.0–46.0)
Hemoglobin: 11.3 g/dL — ABNORMAL LOW (ref 12.0–15.0)
MCH: 28.6 pg (ref 26.0–34.0)
MCHC: 30.3 g/dL (ref 30.0–36.0)
MCV: 94.4 fL (ref 80.0–100.0)
Platelets: 374 10*3/uL (ref 150–400)
RBC: 3.95 MIL/uL (ref 3.87–5.11)
RDW: 14.6 % (ref 11.5–15.5)
WBC: 9.2 10*3/uL (ref 4.0–10.5)
nRBC: 0 % (ref 0.0–0.2)

## 2019-08-26 LAB — SURGICAL PCR SCREEN
MRSA, PCR: NEGATIVE
Staphylococcus aureus: NEGATIVE

## 2019-08-26 LAB — APTT: aPTT: 34 seconds (ref 24–36)

## 2019-08-26 LAB — GLUCOSE, CAPILLARY: Glucose-Capillary: 109 mg/dL — ABNORMAL HIGH (ref 70–99)

## 2019-08-26 LAB — PROTIME-INR
INR: 1.3 — ABNORMAL HIGH (ref 0.8–1.2)
Prothrombin Time: 15.8 seconds — ABNORMAL HIGH (ref 11.4–15.2)

## 2019-08-26 LAB — HEMOGLOBIN A1C
Hgb A1c MFr Bld: 6.1 % — ABNORMAL HIGH (ref 4.8–5.6)
Mean Plasma Glucose: 128.37 mg/dL

## 2019-08-26 NOTE — Progress Notes (Signed)
PT result routed to Dr. Deri Fuelling office for review.

## 2019-08-27 ENCOUNTER — Ambulatory Visit (INDEPENDENT_AMBULATORY_CARE_PROVIDER_SITE_OTHER): Payer: BC Managed Care – PPO | Admitting: Family Medicine

## 2019-08-27 ENCOUNTER — Encounter (INDEPENDENT_AMBULATORY_CARE_PROVIDER_SITE_OTHER): Payer: Self-pay | Admitting: Family Medicine

## 2019-08-27 VITALS — BP 104/71 | HR 91 | Temp 98.1°F | Ht 67.0 in | Wt 275.0 lb

## 2019-08-27 DIAGNOSIS — E559 Vitamin D deficiency, unspecified: Secondary | ICD-10-CM

## 2019-08-27 DIAGNOSIS — R7303 Prediabetes: Secondary | ICD-10-CM | POA: Diagnosis not present

## 2019-08-27 DIAGNOSIS — I890 Lymphedema, not elsewhere classified: Secondary | ICD-10-CM | POA: Diagnosis not present

## 2019-08-27 DIAGNOSIS — Z9189 Other specified personal risk factors, not elsewhere classified: Secondary | ICD-10-CM | POA: Diagnosis not present

## 2019-08-27 DIAGNOSIS — I10 Essential (primary) hypertension: Secondary | ICD-10-CM

## 2019-08-27 DIAGNOSIS — Z6841 Body Mass Index (BMI) 40.0 and over, adult: Secondary | ICD-10-CM

## 2019-08-27 MED ORDER — SAXENDA 18 MG/3ML ~~LOC~~ SOPN: 3.0000 mg | PEN_INJECTOR | Freq: Every day | SUBCUTANEOUS | 0 refills | Status: DC

## 2019-08-27 MED ORDER — VITAMIN D (ERGOCALCIFEROL) 1.25 MG (50000 UNIT) PO CAPS: 50000.0000 [IU] | ORAL_CAPSULE | ORAL | 0 refills | Status: DC

## 2019-08-27 MED ORDER — METFORMIN HCL 500 MG PO TABS: 500.0000 mg | ORAL_TABLET | Freq: Two times a day (BID) | ORAL | 0 refills | Status: DC

## 2019-08-27 MED ORDER — INSULIN PEN NEEDLE 32G X 4 MM MISC: 1.0000 | Freq: Once | 0 refills | Status: AC

## 2019-08-28 ENCOUNTER — Other Ambulatory Visit (INDEPENDENT_AMBULATORY_CARE_PROVIDER_SITE_OTHER): Payer: Self-pay

## 2019-08-28 MED ORDER — BD PEN NEEDLE NANO 2ND GEN 32G X 4 MM MISC: 1.0000 | Freq: Every day | 0 refills | Status: DC

## 2019-08-29 ENCOUNTER — Other Ambulatory Visit (HOSPITAL_COMMUNITY): Payer: BC Managed Care – PPO

## 2019-08-29 NOTE — Progress Notes (Signed)
Chief Complaint:   OBESITY Laura [name] is here to discuss her progress with her obesity treatment plan along with follow-up of her obesity related diagnoses. Laura [name] is on the Category 2 Plan and states she is following her eating plan approximately 50% of the time. Laura [name] states she is doing 0 minutes 0 times per week.  Today's visit was #: 76 Starting weight: 278 lbs Starting date: 01/17/2018 Today's weight: 275 lbs Today's date: 08/27/2019 Total lbs lost to date: 3 Total lbs lost since last in-office visit: 0  Interim History: Laura [name] is supposed to have total knee replacement. She lives with her husband and denies difficulty to follow the plan. She notes its just a matter of her "not deviating from the pan" which is challenging for her at this time. She denies cravings or hunger issues.  Subjective:   1. Vitamin D deficiency Laura [name] is on Vit D prescription. Last Vit D level was 37.4 on 05/16/2019.  2. Pre-diabetes Laura [name]'s A1c was recently elevated from 5.9 to 6.1 now. She notes difficulty with weight loss.  3. Essential hypertension Laura [name]'s blood pressure is well controlled today. She denies concerns with medications or treatment plan.  4. Lymphedema of both lower extremities Laura [name] notes her edema is chronic and worsens in the Summer.  5. At risk for diabetes mellitus Laura [name] is at higher than average risk for developing diabetes due to her obesity.   Assessment/Plan:   1. Vitamin D deficiency Low Vitamin D level contributes to fatigue and are associated with obesity, breast, and colon cancer. We will refill prescription Vitamin D for 1 month. Laura [name] will follow-up for routine testing of Vitamin D, at least 2-3 times per year to avoid over-replacement.  - Vitamin D, Ergocalciferol, (DRISDOL) 1.25 MG (50000 UNIT) CAPS capsule; Take 1 capsule (50,000 Units total) by mouth every 7 (seven) days.  Dispense: 4 capsule; Refill: 0  2. Pre-diabetes Laura [name] will continue to work on weight loss, exercise,  and decreasing simple carbohydrates to help decrease the risk of diabetes. We will refill metformin for 1 month.  - metFORMIN (GLUCOPHAGE) 500 MG tablet; Take 1 tablet (500 mg total) by mouth 2 (two) times daily with a meal.  Dispense: 60 tablet; Refill: 0  3. Essential hypertension Laura [name] is working on healthy weight loss and exercise to improve blood pressure control. We will watch for signs of hypotension as she continues her lifestyle modifications. Laura [name] will continue lisinopril and hydrochlorothiazide.  4. Lymphedema of both lower extremities We will send a referral for physical therapy for lymphedema treatment.  - Ambulatory referral to Physical Therapy  5. At risk for diabetes mellitus Laura [name] was given approximately 15 minutes of diabetes education and counseling today. We discussed intensive lifestyle modifications today with an emphasis on weight loss as well as increasing exercise and decreasing simple carbohydrates in her diet. We also reviewed medication options with an emphasis on risk versus benefit of those discussed.   Repetitive spaced learning was employed today to elicit superior memory formation and behavioral change.  6. Class 3 severe obesity with serious comorbidity and body mass index (BMI) of 40.0 to 44.9 in adult, unspecified obesity type (HCC) Laura [name] is currently in the action stage of change. As such, her goal is to continue with weight loss efforts. She has agreed to the Category 2 Plan with breakfast options.   We discussed various medication options to help Laura [name] with her weight loss efforts and we both agreed to start Saxenda at 0.6 mg q  daily with no refills.  - Liraglutide -Weight Management (SAXENDA) 18 MG/3ML SOPN; Inject 0.5 mLs (3 mg total) into the skin daily.  Dispense: 5 pen; Refill: 0 - Insulin Pen Needle 32G X 4 MM MISC; 1 each by Does not apply route once for 1 dose.  Dispense: 100 each; Refill: 0  Exercise goals: As is.  Behavioral modification  strategies: no skipping meals, meal planning and cooking strategies and planning for success.  Laura [name] has agreed to follow-up with our clinic in 2 weeks. She was informed of the importance of frequent follow-up visits to maximize her success with intensive lifestyle modifications for her multiple health conditions.   Objective:   Blood pressure 104/71, pulse 91, temperature 98.1 F (36.7 C), temperature source Oral, height 5\' 7"  (1.702 m), weight 275 lb (124.7 kg), SpO2 97 %. Body mass index is 43.07 kg/m.  General: Cooperative, alert, well developed, in no acute distress. HEENT: Conjunctivae and lids unremarkable. Cardiovascular: Regular rhythm.  Lungs: Normal work of breathing. Neurologic: No focal deficits.   Lab Results  Component Value Date   CREATININE 1.01 (H) 08/26/2019   BUN 22 08/26/2019   NA 136 08/26/2019   K 4.1 08/26/2019   CL 101 08/26/2019   CO2 26 08/26/2019   Lab Results  Component Value Date   ALT 17 08/26/2019   AST 21 08/26/2019   ALKPHOS 53 08/26/2019   BILITOT 0.6 08/26/2019   Lab Results  Component Value Date   HGBA1C 6.1 (H) 08/26/2019   HGBA1C 5.9 (H) 05/16/2019   HGBA1C 5.7 (H) 11/12/2018   HGBA1C 5.9 (H) 06/21/2018   HGBA1C 5.8 (H) 01/17/2018   Lab Results  Component Value Date   INSULIN 15.1 05/16/2019   INSULIN 11.8 11/12/2018   INSULIN 13.9 06/21/2018   INSULIN 8.4 01/17/2018   Lab Results  Component Value Date   TSH 1.190 01/17/2018   Lab Results  Component Value Date   CHOL 181 05/16/2019   HDL 78 05/16/2019   LDLCALC 94 05/16/2019   TRIG 46 05/16/2019   Lab Results  Component Value Date   WBC 9.2 08/26/2019   HGB 11.3 (L) 08/26/2019   HCT 37.3 08/26/2019   MCV 94.4 08/26/2019   PLT 374 08/26/2019   Lab Results  Component Value Date   IRON 41 (L) 11/20/2008   TIBC 319 11/20/2008   FERRITIN 29 11/20/2008   Attestation Statements:   Reviewed by clinician on day of visit: allergies, medications, problem list,  medical history, surgical history, family history, social history, and previous encounter notes.   I, 01/20/2009, am acting as transcriptionist for Burt Knack, MD.  I have reviewed the above documentation for accuracy and completeness, and I agree with the above. -  Quillian Quince, MD

## 2019-09-02 ENCOUNTER — Encounter (HOSPITAL_COMMUNITY): Admission: RE | Payer: Self-pay | Source: Ambulatory Visit

## 2019-09-02 ENCOUNTER — Inpatient Hospital Stay (HOSPITAL_COMMUNITY)
Admission: RE | Admit: 2019-09-02 | Payer: BC Managed Care – PPO | Source: Ambulatory Visit | Admitting: Orthopedic Surgery

## 2019-09-02 LAB — TYPE AND SCREEN
ABO/RH(D): B POS
Antibody Screen: NEGATIVE

## 2019-09-02 SURGERY — ARTHROPLASTY, KNEE, TOTAL
Anesthesia: Choice | Site: Knee | Laterality: Right

## 2019-09-05 ENCOUNTER — Ambulatory Visit: Payer: BC Managed Care – PPO | Admitting: Family Medicine

## 2019-09-05 ENCOUNTER — Ambulatory Visit: Payer: Self-pay

## 2019-09-05 ENCOUNTER — Other Ambulatory Visit: Payer: Self-pay

## 2019-09-05 VITALS — BP 125/85 | Ht 66.5 in | Wt 265.0 lb

## 2019-09-05 DIAGNOSIS — M1711 Unilateral primary osteoarthritis, right knee: Secondary | ICD-10-CM | POA: Diagnosis not present

## 2019-09-05 MED ORDER — TRIAMCINOLONE ACETONIDE 40 MG/ML IJ SUSP
40.0000 mg | Freq: Once | INTRAMUSCULAR | Status: AC
  Administered 2019-09-05: 40 mg via INTRA_ARTICULAR

## 2019-09-05 NOTE — Progress Notes (Signed)
Laura [name] - 64 y.o. female MRN [id number]  Date of birth: [birth year]  SUBJECTIVE:  Including CC & ROS.  No chief complaint on file.   Laura [name] is a 64 y.o. female that is presenting with acute on chronic right knee pain.  The pain is severe in nature.  She is planning on having a knee replacement later this year.  She has to get her BMI at a lower level.   Review of Systems See HPI   HISTORY: Past Medical, Surgical, Social, and Family History Reviewed & Updated per EMR.   Pertinent Historical Findings include:  Past Medical History:  Diagnosis Date  . Anemia, normocytic normochromic 07/04/2017   Chronic Hb 10 +/- 0.5 gms  . Edema   . 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 Herpes   . High blood pressure   . History of blood clots   . Obesity   . Partial tear of Achilles tendon   . Pneumonia   . 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 Pulmonary embolism ([place])   . 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 Vitamin D deficiency     Past Surgical History:  Procedure Laterality Date  . BREAST BIOPSY    . [place]  . 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 Hypertension Mother   . Heart failure Mother   . Cancer Mother   . Depression Mother   . Sleep apnea Mother   . Obesity Mother   . Prostate cancer Father   . Multiple myeloma Father   . Heart failure Father   . Obesity Father   . Sleep apnea Father   . Kidney disease Father   . Sudden Cardiac Death Father   . Hypertension Father   . Heart attack Neg Hx   . Hyperlipidemia Neg Hx   . Diabetes Neg Hx   . Sudden death Neg Hx   . Breast cancer Neg Hx     Social History    Socioeconomic History  . Marital status: Married    Spouse name: Vika Buske  . Number of children: 2  . Years of education: Not on file  . Highest education level: Not on file  Occupational History  . Occupation: Engineer, building services: [employer]  Tobacco Use  . Smoking status: Never Smoker  . Smokeless tobacco: Never Used  Vaping Use  . Vaping Use: Never used  Substance and Sexual Activity  . Alcohol use: Yes    Alcohol/week: 0.0 standard drinks    Comment: Rarely.  . Drug use: No  . Sexual activity: Not on file  Other Topics Concern  . Not on file  Social History Narrative  . Not on file   Social Determinants of Health   Financial Resource Strain:   . Difficulty of Paying Living Expenses:   Food Insecurity:   . Worried About Charity fundraiser in the Last Year:   . Arboriculturist in the Last Year:   Transportation Needs:   . Film/video editor (Medical):   Marland Kitchen Lack of Transportation (Non-Medical):   Physical Activity:   .  Days of Exercise per Week:   . Minutes of Exercise per Session:   Stress:   . Feeling of Stress :   Social Connections:   . Frequency of Communication with Friends and Family:   . Frequency of Social Gatherings with Friends and Family:   . Attends Religious Services:   . Active Member of Clubs or Organizations:   . Attends Archivist Meetings:   Marland Kitchen Marital Status:   Intimate Partner Violence:   . Fear of Current or Ex-Partner:   . Emotionally Abused:   Marland Kitchen Physically Abused:   . Sexually Abused:      PHYSICAL EXAM:  VS: BP 125/85   Ht 5' 6.5" (1.689 m)   Wt 265 lb (120.2 kg)   BMI 42.13 kg/m  Physical Exam Gen: NAD, alert, cooperative with exam, well-appearing MSK:  Right knee: No obvious effusion. Normal range of motion. Instability valgus varus stress testing. Neurovascularly intact   Aspiration/Injection Procedure Note Preet Mangano 06/09/1955  Procedure: Injection Indications: Right knee  pain  Procedure Details Consent: Risks of procedure as well as the alternatives and risks of each were explained to the (patient/caregiver).  Consent for procedure obtained. Time Out: Verified patient identification, verified procedure, site/side was marked, verified correct patient position, special equipment/implants available, medications/allergies/relevent history reviewed, required imaging and test results available.  Performed.  The area was cleaned with iodine and alcohol swabs.    The right knee superior lateral suprapatellar approach was injected using 1 cc's of 40 mg Kenalog and 4 cc's of 0.25% bupivacaine with a 21 2" needle.  Ultrasound was used. Images were obtained in long views showing the injection.     A sterile dressing was applied.  Patient did tolerate procedure well.     ASSESSMENT & PLAN:   OA (osteoarthritis) of knee Exacerbation of underlying degenerative changes.  Has arthroplasty scheduled for later this year -Counseled on home exercise therapy and supportive care. -Injection. -Could consider Toradol or gel injections.

## 2019-09-05 NOTE — Patient Instructions (Addendum)
Good to see you Keep up the good work Please try ice as needed   Please send me a message in MyChart with any questions or updates.  Please see me back in 4 weeks.   --Dr. Jordan Likes

## 2019-09-06 NOTE — Assessment & Plan Note (Signed)
Exacerbation of underlying degenerative changes.  Has arthroplasty scheduled for later this year -Counseled on home exercise therapy and supportive care. -Injection. -Could consider Toradol or gel injections.

## 2019-09-18 ENCOUNTER — Ambulatory Visit (INDEPENDENT_AMBULATORY_CARE_PROVIDER_SITE_OTHER): Payer: BC Managed Care – PPO | Admitting: Family Medicine

## 2019-09-23 ENCOUNTER — Ambulatory Visit (INDEPENDENT_AMBULATORY_CARE_PROVIDER_SITE_OTHER): Payer: BC Managed Care – PPO | Admitting: Family Medicine

## 2019-09-25 ENCOUNTER — Other Ambulatory Visit: Payer: Self-pay

## 2019-09-25 ENCOUNTER — Encounter (INDEPENDENT_AMBULATORY_CARE_PROVIDER_SITE_OTHER): Payer: Self-pay | Admitting: Family Medicine

## 2019-09-25 ENCOUNTER — Ambulatory Visit (INDEPENDENT_AMBULATORY_CARE_PROVIDER_SITE_OTHER): Payer: BC Managed Care – PPO | Admitting: Family Medicine

## 2019-09-25 VITALS — BP 100/68 | HR 75 | Temp 97.7°F | Ht 67.0 in | Wt 270.0 lb

## 2019-09-25 DIAGNOSIS — Z9189 Other specified personal risk factors, not elsewhere classified: Secondary | ICD-10-CM

## 2019-09-25 DIAGNOSIS — I10 Essential (primary) hypertension: Secondary | ICD-10-CM | POA: Diagnosis not present

## 2019-09-25 DIAGNOSIS — Z6841 Body Mass Index (BMI) 40.0 and over, adult: Secondary | ICD-10-CM

## 2019-09-25 DIAGNOSIS — R7303 Prediabetes: Secondary | ICD-10-CM

## 2019-09-25 DIAGNOSIS — E559 Vitamin D deficiency, unspecified: Secondary | ICD-10-CM | POA: Diagnosis not present

## 2019-09-25 MED ORDER — VITAMIN D (ERGOCALCIFEROL) 1.25 MG (50000 UNIT) PO CAPS: 50000.0000 [IU] | ORAL_CAPSULE | ORAL | 0 refills | Status: DC

## 2019-09-25 NOTE — Progress Notes (Signed)
Chief Complaint:   OBESITY Laura [name] is here to discuss her progress with her obesity treatment plan along with follow-up of her obesity related diagnoses. Laura [name] is on the Category 2 Plan and states she is following her eating plan approximately 80% of the time. Laura [name] states she is biking and/or walking for 30 minutes 5 times per week.  Today's visit was #: 20 Starting weight: 278 lbs Starting date: 01/17/2018 Today's weight: 270 lbs Today's date: 09/25/2019 Total lbs lost to date: 8 lbs Total lbs lost since last in-office visit: 5 lbs  Interim History: Laura [name] says she went to visit her daughter for 4-5 days.  She tried to stick to the plan as much as possible.  She says that her hunger and cravings are well-controlled.  This past couple of weeks, she has been exercising and has been more successful with staying on plan more frequently.  Subjective:   1. Essential hypertension Review: taking medications as instructed, no medication side effects noted, no chest pain on exertion, no dyspnea on exertion, no swelling of ankles.  She cut her blood pressure medication dose in half a couple of months ago.  She is not checking her blood pressure at home.  No symptoms at all.  She says she is "feeling well".   BP Readings from Last 3 Encounters:  09/25/19 100/68  09/05/19 125/85  08/27/19 104/71   2. Prediabetes Laura [name] has a diagnosis of prediabetes based on her elevated HgA1c and was informed this puts her at greater risk of developing diabetes. She continues to work on diet and exercise to decrease her risk of diabetes. She denies nausea or hypoglycemia.  She never took the [place] she was given at her last office visit with Dr. Dalbert Garnet.  She wanted to try diet and exercise prior to starting medication.  Labs less than 3 months ago are up to date.  Lab Results  Component Value Date   HGBA1C 6.1 (H) 08/26/2019   Lab Results  Component Value Date   INSULIN 15.1 05/16/2019   INSULIN 11.8  11/12/2018   INSULIN 13.9 06/21/2018   INSULIN 8.4 01/17/2018   3. Vitamin D deficiency Laura [name]'s Vitamin D level was 37.4 on 05/16/2019. She is currently taking prescription vitamin D 50,000 IU each week. She denies nausea, vomiting or muscle weakness.  4. At risk for complication associated with hypotension The patient is at a higher than average risk of hypotension due to weight loss.  She will watch her blood pressure closely as she loses weight.  Assessment/Plan:   1. Essential hypertension [place] is working on healthy weight loss and exercise to improve blood pressure control. We will watch for signs of hypotension as she continues her lifestyle modifications.  Daily or every other day home blood pressure monitoring encouraged.  Bring log.  2. Prediabetes Laura [name] will continue to work on weight loss, exercise, and decreasing simple carbohydrates to help decrease the risk of diabetes.  Continue current plan.  Continue prudent nutritional plan, weight loss, some exercise.   3. Vitamin D deficiency Low Vitamin D level contributes to fatigue and are associated with obesity, breast, and colon cancer. She agrees to continue to take prescription Vitamin D @50 ,000 IU every week and will follow-up for routine testing of Vitamin D, at least 2-3 times per year to avoid over-replacement. - Vitamin D, Ergocalciferol, (DRISDOL) 1.25 MG (50000 UNIT) CAPS capsule; Take 1 capsule (50,000 Units total) by mouth every 7 (seven) days.  Dispense: 4 capsule; Refill:  0  4. At risk for complication associated with hypotension Laura [name] was given approximately 15 minutes of education and counseling today to help avoid hypotension. We discussed risks of hypotension with weight loss and signs of hypotension such as feeling lightheaded or unsteady.  Repetitive spaced learning was employed today to elicit superior memory formation and behavioral change.  5. Class 3 severe obesity with serious comorbidity and body mass index  (BMI) of 40.0 to 44.9 in adult, unspecified obesity type (HCC) Laura [name] is currently in the action stage of change. As such, her goal is to continue with weight loss efforts. She has agreed to the Category 2 Plan.   Exercise goals: As is.  Behavioral modification strategies: increasing lean protein intake, decreasing simple carbohydrates, increasing water intake and planning for success.  Laura [name] has agreed to follow-up with our clinic in 2 weeks. She was informed of the importance of frequent follow-up visits to maximize her success with intensive lifestyle modifications for her multiple health conditions.   Objective:   Blood pressure 100/68, pulse 75, temperature 97.7 F (36.5 C), temperature source Oral, height 5\' 7"  (1.702 m), weight 270 lb (122.5 kg), SpO2 95 %. Body mass index is 42.29 kg/m.  General: Cooperative, alert, well developed, in no acute distress. HEENT: Conjunctivae and lids unremarkable. Cardiovascular: Regular rhythm.  Lungs: Normal work of breathing. Neurologic: No focal deficits.   Lab Results  Component Value Date   CREATININE 1.01 (H) 08/26/2019   BUN 22 08/26/2019   NA 136 08/26/2019   K 4.1 08/26/2019   CL 101 08/26/2019   CO2 26 08/26/2019   Lab Results  Component Value Date   ALT 17 08/26/2019   AST 21 08/26/2019   ALKPHOS 53 08/26/2019   BILITOT 0.6 08/26/2019   Lab Results  Component Value Date   HGBA1C 6.1 (H) 08/26/2019   HGBA1C 5.9 (H) 05/16/2019   HGBA1C 5.7 (H) 11/12/2018   HGBA1C 5.9 (H) 06/21/2018   HGBA1C 5.8 (H) 01/17/2018   Lab Results  Component Value Date   INSULIN 15.1 05/16/2019   INSULIN 11.8 11/12/2018   INSULIN 13.9 06/21/2018   INSULIN 8.4 01/17/2018   Lab Results  Component Value Date   TSH 1.190 01/17/2018   Lab Results  Component Value Date   CHOL 181 05/16/2019   HDL 78 05/16/2019   LDLCALC 94 05/16/2019   TRIG 46 05/16/2019   Lab Results  Component Value Date   WBC 9.2 08/26/2019   HGB 11.3 (L)  08/26/2019   HCT 37.3 08/26/2019   MCV 94.4 08/26/2019   PLT 374 08/26/2019   Lab Results  Component Value Date   IRON 41 (L) 11/20/2008   TIBC 319 11/20/2008   FERRITIN 29 11/20/2008   Attestation Statements:   Reviewed by clinician on day of visit: allergies, medications, problem list, medical history, surgical history, family history, social history, and previous encounter notes.  I, 01/20/2009, CMA, am acting as Insurance claims handler for Energy manager, DO.  I have reviewed the above documentation for accuracy and completeness, and I agree with the above. [employer], DO

## 2019-10-08 ENCOUNTER — Ambulatory Visit: Payer: BC Managed Care – PPO | Admitting: Family Medicine

## 2019-10-08 NOTE — Progress Notes (Deleted)
Laura [name] - 64 y.o. female MRN [id number]  Date of birth: [birth year]  SUBJECTIVE:  Including CC & ROS.  No chief complaint on file.   Laura [name] is a 64 y.o. female that is  ***.  ***   Review of Systems See HPI   HISTORY: Past Medical, Surgical, Social, and Family History Reviewed & Updated per EMR.   Pertinent Historical Findings include:  Past Medical History:  Diagnosis Date  . Anemia, normocytic normochromic 07/04/2017   Chronic Hb 10 +/- 0.5 gms  . Edema   . 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 Herpes   . High blood pressure   . History of blood clots   . Obesity   . Partial tear of Achilles tendon   . Pneumonia   . 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 Pulmonary embolism ([place])   . 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 Vitamin D deficiency     Past Surgical History:  Procedure Laterality Date  . BREAST BIOPSY    . [place]  . 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 Hypertension Mother   . Heart failure Mother   . Cancer Mother   . Depression Mother   . Sleep apnea Mother   . Obesity Mother   . Prostate cancer Father   . Multiple myeloma Father   . Heart failure Father   . Obesity Father   . Sleep apnea Father   . Kidney disease Father   . Sudden Cardiac Death Father   . Hypertension Father   . Heart attack Neg Hx   . Hyperlipidemia Neg Hx   . Diabetes Neg Hx   . Sudden death Neg Hx   . Breast cancer Neg Hx     Social History   Socioeconomic History  . Marital status: Married    Spouse name: Rettie Laird  . Number of children: 2  . Years of education: Not on file  . Highest education level:  Not on file  Occupational History  . Occupation: Engineer, building services: [employer]  Tobacco Use  . Smoking status: Never Smoker  . Smokeless tobacco: Never Used  Vaping Use  . Vaping Use: Never used  Substance and Sexual Activity  . Alcohol use: Yes    Alcohol/week: 0.0 standard drinks    Comment: Rarely.  . Drug use: No  . Sexual activity: Not on file  Other Topics Concern  . Not on file  Social History Narrative  . Not on file   Social Determinants of Health   Financial Resource Strain:   . Difficulty of Paying Living Expenses:   Food Insecurity:   . Worried About Charity fundraiser in the Last Year:   . Arboriculturist in the Last Year:   Transportation Needs:   . Film/video editor (Medical):   Marland Kitchen Lack of Transportation (Non-Medical):   Physical Activity:   . Days of Exercise per Week:   . Minutes of Exercise per Session:   Stress:   . Feeling of Stress :   Social Connections:   . Frequency  of Communication with Friends and Family:   . Frequency of Social Gatherings with Friends and Family:   . Attends Religious Services:   . Active Member of Clubs or Organizations:   . Attends Archivist Meetings:   Marland Kitchen Marital Status:   Intimate Partner Violence:   . Fear of Current or Ex-Partner:   . Emotionally Abused:   Marland Kitchen Physically Abused:   . Sexually Abused:      PHYSICAL EXAM:  VS: There were no vitals taken for this visit. Physical Exam Gen: NAD, alert, cooperative with exam, well-appearing MSK:  ***      ASSESSMENT & PLAN:   No problem-specific Assessment & Plan notes found for this encounter.

## 2019-10-16 ENCOUNTER — Ambulatory Visit (INDEPENDENT_AMBULATORY_CARE_PROVIDER_SITE_OTHER): Payer: BC Managed Care – PPO | Admitting: Family Medicine

## 2019-10-17 ENCOUNTER — Ambulatory Visit (INDEPENDENT_AMBULATORY_CARE_PROVIDER_SITE_OTHER): Payer: BC Managed Care – PPO | Admitting: Family Medicine

## 2019-11-06 ENCOUNTER — Other Ambulatory Visit: Payer: Self-pay

## 2019-11-06 ENCOUNTER — Encounter (INDEPENDENT_AMBULATORY_CARE_PROVIDER_SITE_OTHER): Payer: Self-pay | Admitting: Family Medicine

## 2019-11-06 ENCOUNTER — Ambulatory Visit (INDEPENDENT_AMBULATORY_CARE_PROVIDER_SITE_OTHER): Payer: BC Managed Care – PPO | Admitting: Family Medicine

## 2019-11-06 VITALS — BP 104/69 | HR 78 | Temp 97.8°F | Ht 67.0 in | Wt 274.0 lb

## 2019-11-06 DIAGNOSIS — E559 Vitamin D deficiency, unspecified: Secondary | ICD-10-CM

## 2019-11-06 DIAGNOSIS — Z9189 Other specified personal risk factors, not elsewhere classified: Secondary | ICD-10-CM

## 2019-11-06 DIAGNOSIS — Z6841 Body Mass Index (BMI) 40.0 and over, adult: Secondary | ICD-10-CM

## 2019-11-06 DIAGNOSIS — R7303 Prediabetes: Secondary | ICD-10-CM | POA: Diagnosis not present

## 2019-11-06 MED ORDER — METFORMIN HCL 500 MG PO TABS: 500.0000 mg | ORAL_TABLET | Freq: Two times a day (BID) | ORAL | 0 refills | Status: DC

## 2019-11-06 MED ORDER — VITAMIN D (ERGOCALCIFEROL) 1.25 MG (50000 UNIT) PO CAPS: 50000.0000 [IU] | ORAL_CAPSULE | ORAL | 0 refills | Status: DC

## 2019-11-06 NOTE — Progress Notes (Signed)
Chief Complaint:   OBESITY Laura [name] is here to discuss her progress with her obesity treatment plan along with follow-up of her obesity related diagnoses. Laura [name] is on the Category 2 Plan and states she is following her eating plan approximately 50% of the time. Laura [name] states she is doing cardio for 30 minutes 3-4 times per week.  Today's visit was #: 21 Starting weight: 278 lbs Starting date: 01/17/2018 Today's weight: 274 lbs Today's date: 11/06/2019 Total lbs lost to date: 4 Total lbs lost since last in-office visit: 0  Interim History: Laura [name] has been off track with her eating plan the last few weeks with increased celebration eating and increased eating out. She is somewhat discouraged but she is ready to get back on track. She declined starting Saxenda.  Subjective:   1. Pre-diabetes Laura [name] is sometimes missing her second dose of metformin. She denies nausea or vomiting.   2. Vitamin D deficiency Laura [name] is stable on Vit D and she denies nausea or vomiting. She requests a refill today.  3. At risk for diabetes mellitus Laura [name] is at higher than average risk for developing diabetes due to her obesity.   Assessment/Plan:   1. Pre-diabetes Laura [name] will continue to work on weight loss, exercise, and decreasing simple carbohydrates to help decrease the risk of diabetes. We will refill metformin for 1 month. It id on to take her second dose at lunch as long as it is at least 4 hour after her first dose.  - metFORMIN (GLUCOPHAGE) 500 MG tablet; Take 1 tablet (500 mg total) by mouth 2 (two) times daily with a meal.  Dispense: 60 tablet; Refill: 0  2. Vitamin D deficiency Low Vitamin D level contributes to fatigue and are associated with obesity, breast, and colon cancer. We will refill prescription Vitamin D for 1 month. Laura [name] will follow-up for routine testing of Vitamin D, at least 2-3 times per year to avoid over-replacement.  - Vitamin D, Ergocalciferol, (DRISDOL) 1.25 MG (50000 UNIT) CAPS  capsule; Take 1 capsule (50,000 Units total) by mouth every 7 (seven) days.  Dispense: 4 capsule; Refill: 0  3. At risk for diabetes mellitus Laura [name] was given approximately 15 minutes of diabetes education and counseling today. We discussed intensive lifestyle modifications today with an emphasis on weight loss as well as increasing exercise and decreasing simple carbohydrates in her diet. We also reviewed medication options with an emphasis on risk versus benefit of those discussed.   Repetitive spaced learning was employed today to elicit superior memory formation and behavioral change.  4. Class 3 severe obesity with serious comorbidity and body mass index (BMI) of 40.0 to 44.9 in adult, unspecified obesity type (HCC) Laura [name] is currently in the action stage of change. As such, her goal is to continue with weight loss efforts. She has agreed to the Category 2 Plan or keeping a food journal and adhering to recommended goals of 1200-1600 calories and 90+ grams of protein daily.   We discussed various medication options to help Laura [name] with her weight loss efforts and we both agreed to discontinue Saxenda and will continue to work on diet and exercise.  Exercise goals: As is.  Behavioral modification strategies: increasing lean protein intake and meal planning and cooking strategies.  Laura [name] has agreed to follow-up with our clinic in 2 weeks. She was informed of the importance of frequent follow-up visits to maximize her success with intensive lifestyle modifications for her multiple health conditions.   Objective:   Blood  pressure 104/69, pulse 78, temperature 97.8 F (36.6 C), temperature source Oral, height 5\' 7"  (1.702 m), weight 274 lb (124.3 kg), SpO2 98 %. Body mass index is 42.91 kg/m.  General: Cooperative, alert, well developed, in no acute distress. HEENT: Conjunctivae and lids unremarkable. Cardiovascular: Regular rhythm.  Lungs: Normal work of breathing. Neurologic: No focal deficits.    Lab Results  Component Value Date   CREATININE 1.01 (H) 08/26/2019   BUN 22 08/26/2019   NA 136 08/26/2019   K 4.1 08/26/2019   CL 101 08/26/2019   CO2 26 08/26/2019   Lab Results  Component Value Date   ALT 17 08/26/2019   AST 21 08/26/2019   ALKPHOS 53 08/26/2019   BILITOT 0.6 08/26/2019   Lab Results  Component Value Date   HGBA1C 6.1 (H) 08/26/2019   HGBA1C 5.9 (H) 05/16/2019   HGBA1C 5.7 (H) 11/12/2018   HGBA1C 5.9 (H) 06/21/2018   HGBA1C 5.8 (H) 01/17/2018   Lab Results  Component Value Date   INSULIN 15.1 05/16/2019   INSULIN 11.8 11/12/2018   INSULIN 13.9 06/21/2018   INSULIN 8.4 01/17/2018   Lab Results  Component Value Date   TSH 1.190 01/17/2018   Lab Results  Component Value Date   CHOL 181 05/16/2019   HDL 78 05/16/2019   LDLCALC 94 05/16/2019   TRIG 46 05/16/2019   Lab Results  Component Value Date   WBC 9.2 08/26/2019   HGB 11.3 (L) 08/26/2019   HCT 37.3 08/26/2019   MCV 94.4 08/26/2019   PLT 374 08/26/2019   Lab Results  Component Value Date   IRON 41 (L) 11/20/2008   TIBC 319 11/20/2008   FERRITIN 29 11/20/2008   Attestation Statements:   Reviewed by clinician on day of visit: allergies, medications, problem list, medical history, surgical history, family history, social history, and previous encounter notes.   I, 01/20/2009, am acting as transcriptionist for Burt Knack, MD.  I have reviewed the above documentation for accuracy and completeness, and I agree with the above. -  Quillian Quince, MD

## 2019-11-20 ENCOUNTER — Other Ambulatory Visit: Payer: Self-pay

## 2019-11-20 ENCOUNTER — Encounter (INDEPENDENT_AMBULATORY_CARE_PROVIDER_SITE_OTHER): Payer: Self-pay | Admitting: Family Medicine

## 2019-11-20 ENCOUNTER — Ambulatory Visit (INDEPENDENT_AMBULATORY_CARE_PROVIDER_SITE_OTHER): Payer: BC Managed Care – PPO | Admitting: Family Medicine

## 2019-11-20 VITALS — BP 114/76 | HR 75 | Temp 97.4°F | Ht 67.0 in | Wt 274.0 lb

## 2019-11-20 DIAGNOSIS — Z6841 Body Mass Index (BMI) 40.0 and over, adult: Secondary | ICD-10-CM | POA: Diagnosis not present

## 2019-11-20 DIAGNOSIS — F3289 Other specified depressive episodes: Secondary | ICD-10-CM

## 2019-11-21 NOTE — Progress Notes (Signed)
Chief Complaint:   OBESITY Laura [name] is here to discuss her progress with her obesity treatment plan along with follow-up of her obesity related diagnoses. Laura [name] is on the Category 2 Plan or keeping a food journal and adhering to recommended goals of 1200-1600 calories and 90+ grams of protein daily and states she is following her eating plan approximately 80% of the time. [name] states she is riding the stationary bike for 30 minutes 3 times per week.  Today's visit was #: 22 Starting weight: 278 lbs Starting date: 01/17/2018 Today's weight: 274 lbs Today's date: 11/20/2019 Total lbs lost to date: 4 Total lbs lost since last in-office visit: 0  Interim History: [name] has done well maintaining her weight but she is frustrated with her lack of progress. She is trying to avoid medications and she is especially struggling with evenings and weekends. She isn't always meeting her protein goals.  Subjective:   1. Other depression with emotional eating Laura [name] is struggling with some emotional eating behaviors, especially on the weekends and evenings. She hasn't been sleeping as well with her husband traveling.  Assessment/Plan:   1. Other depression with emotional eating Emotional eating strategies were discussed today, especially planning her weekends and better sleep is needed. Will continue to follow closely. Orders and follow up as documented in patient record.   2. Class 3 severe obesity with serious comorbidity and body mass index (BMI) of 40.0 to 44.9 in adult, unspecified obesity type (HCC) Laura [name] is currently in the action stage of change. As such, her goal is to continue with weight loss efforts. She has agreed to the Category 2 Plan or following a lower carbohydrate, vegetable and lean protein rich diet plan.   Exercise goals: As is.  Behavioral modification strategies: meal planning and cooking strategies, ways to avoid boredom eating and ways to avoid night time snacking.  Laura [name] has  agreed to follow-up with our clinic in 2 weeks. She was informed of the importance of frequent follow-up visits to maximize her success with intensive lifestyle modifications for her multiple health conditions.   Objective:   Blood pressure 114/76, pulse 75, temperature (!) 97.4 F (36.3 C), height 5\' 7"  (1.702 m), weight 274 lb (124.3 kg), SpO2 98 %. Body mass index is 42.91 kg/m.  General: Cooperative, alert, well developed, in no acute distress. HEENT: Conjunctivae and lids unremarkable. Cardiovascular: Regular rhythm.  Lungs: Normal work of breathing. Neurologic: No focal deficits.   Lab Results  Component Value Date   CREATININE 1.01 (H) 08/26/2019   BUN 22 08/26/2019   NA 136 08/26/2019   K 4.1 08/26/2019   CL 101 08/26/2019   CO2 26 08/26/2019   Lab Results  Component Value Date   ALT 17 08/26/2019   AST 21 08/26/2019   ALKPHOS 53 08/26/2019   BILITOT 0.6 08/26/2019   Lab Results  Component Value Date   HGBA1C 6.1 (H) 08/26/2019   HGBA1C 5.9 (H) 05/16/2019   HGBA1C 5.7 (H) 11/12/2018   HGBA1C 5.9 (H) 06/21/2018   HGBA1C 5.8 (H) 01/17/2018   Lab Results  Component Value Date   INSULIN 15.1 05/16/2019   INSULIN 11.8 11/12/2018   INSULIN 13.9 06/21/2018   INSULIN 8.4 01/17/2018   Lab Results  Component Value Date   TSH 1.190 01/17/2018   Lab Results  Component Value Date   CHOL 181 05/16/2019   HDL 78 05/16/2019   LDLCALC 94 05/16/2019   TRIG 46 05/16/2019   Lab Results  Component Value Date   WBC 9.2 08/26/2019   HGB 11.3 (L) 08/26/2019   HCT 37.3 08/26/2019   MCV 94.4 08/26/2019   PLT 374 08/26/2019   Lab Results  Component Value Date   IRON 41 (L) 11/20/2008   TIBC 319 11/20/2008   FERRITIN 29 11/20/2008   Attestation Statements:   Reviewed by clinician on day of visit: allergies, medications, problem list, medical history, surgical history, family history, social history, and previous encounter notes.  Time spent on visit including  pre-visit chart review and post-visit care and charting was 30 minutes.    I, Burt Knack, am acting as transcriptionist for Quillian Quince, MD.  I have reviewed the above documentation for accuracy and completeness, and I agree with the above. -  Quillian Quince, MD

## 2019-12-03 ENCOUNTER — Other Ambulatory Visit (HOSPITAL_COMMUNITY): Payer: BC Managed Care – PPO

## 2019-12-11 ENCOUNTER — Ambulatory Visit (INDEPENDENT_AMBULATORY_CARE_PROVIDER_SITE_OTHER): Payer: BC Managed Care – PPO | Admitting: Family Medicine

## 2019-12-11 ENCOUNTER — Encounter (INDEPENDENT_AMBULATORY_CARE_PROVIDER_SITE_OTHER): Payer: Self-pay

## 2019-12-13 ENCOUNTER — Ambulatory Visit: Payer: Self-pay

## 2019-12-13 ENCOUNTER — Ambulatory Visit (INDEPENDENT_AMBULATORY_CARE_PROVIDER_SITE_OTHER): Payer: BC Managed Care – PPO | Admitting: Family Medicine

## 2019-12-13 ENCOUNTER — Other Ambulatory Visit: Payer: Self-pay

## 2019-12-13 ENCOUNTER — Encounter: Payer: Self-pay | Admitting: Family Medicine

## 2019-12-13 VITALS — BP 110/74 | HR 79 | Ht 67.0 in | Wt 265.0 lb

## 2019-12-13 DIAGNOSIS — M19012 Primary osteoarthritis, left shoulder: Secondary | ICD-10-CM | POA: Diagnosis not present

## 2019-12-13 DIAGNOSIS — M1711 Unilateral primary osteoarthritis, right knee: Secondary | ICD-10-CM

## 2019-12-13 MED ORDER — PENNSAID 2 % EX SOLN
1.0000 | Freq: Two times a day (BID) | CUTANEOUS | 2 refills | Status: DC
Start: 2019-12-13 — End: 2020-05-12

## 2019-12-13 MED ORDER — TRIAMCINOLONE ACETONIDE 40 MG/ML IJ SUSP
40.0000 mg | Freq: Once | INTRAMUSCULAR | Status: AC
  Administered 2019-12-13: 40 mg via INTRA_ARTICULAR

## 2019-12-13 NOTE — Progress Notes (Signed)
Jagger Beahm - 64 y.o. female MRN [id number]  Date of birth: 10/14/1955  SUBJECTIVE:  Including CC & ROS.  Chief Complaint  Patient presents with  . Knee Pain    right  . Shoulder Pain    left     Micaiah Remillard is a 64 y.o. female that is presenting with acute worsening of her right knee pain and left shoulder pain.  Denies any inciting or exacerbating events.  Pain is around the knee joint as well as the shoulder joint.  Symptoms seem to be worse at night.  She is scheduled to have a total knee arthroplasty in November.  She has been going through weight management for the past few months.   Review of Systems See HPI   HISTORY: Past Medical, Surgical, Social, and Family History Reviewed & Updated per EMR.   Pertinent Historical Findings include:  Past Medical History:  Diagnosis Date  . Anemia, normocytic normochromic 07/04/2017   Chronic Hb 10 +/- 0.5 gms  . Edema   . 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 Herpes   . High blood pressure   . History of blood clots   . Obesity   . Partial tear of Achilles tendon   . Pneumonia   . 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 Pulmonary embolism ([place])   . 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 Vitamin D deficiency     Past Surgical History:  Procedure Laterality Date  . BREAST BIOPSY    . [place]  . 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 Hypertension Mother   . Heart failure Mother   . Cancer Mother   . Depression Mother   . Sleep apnea Mother   . Obesity Mother   . Prostate cancer Father   . Multiple myeloma Father   . Heart failure Father   . Obesity Father   . Sleep apnea  Father   . Kidney disease Father   . Sudden Cardiac Death Father   . Hypertension Father   . Heart attack Neg Hx   . Hyperlipidemia Neg Hx   . Diabetes Neg Hx   . Sudden death Neg Hx   . Breast cancer Neg Hx     Social History   Socioeconomic History  . Marital status: Married    Spouse name: Zakira Ressel  . Number of children: 2  . Years of education: Not on file  . Highest education level: Not on file  Occupational History  . Occupation: Engineer, building services: [employer]  Tobacco Use  . Smoking status: Never Smoker  . Smokeless tobacco: Never Used  Vaping Use  . Vaping Use: Never used  Substance and Sexual Activity  . Alcohol use: Yes    Alcohol/week: 0.0 standard drinks    Comment: Rarely.  . Drug use: No  . Sexual activity: Not on file  Other Topics Concern  . Not on file  Social History Narrative  . Not on file   Social Determinants of Health   Financial Resource Strain:   . Difficulty of Paying Living Expenses: Not on file  Food Insecurity:   . Worried About Charity fundraiser in the Last Year: Not on file  . Ran Out of Food in the Last Year: Not on file  Transportation Needs:   . Lack of Transportation (Medical): Not on file  . Lack of Transportation (Non-Medical): Not on file  Physical Activity:   . Days of Exercise per Week: Not on file  . Minutes of Exercise per Session: Not on file  Stress:   . Feeling of Stress : Not on file  Social Connections:   . Frequency of Communication with Friends and Family: Not on file  . Frequency of Social Gatherings with Friends and Family: Not on file  . Attends Religious Services: Not on file  . Active Member of Clubs or Organizations: Not on file  . Attends Archivist Meetings: Not on file  . Marital Status: Not on file  Intimate Partner Violence:   . Fear of Current or Ex-Partner: Not on file  . Emotionally Abused: Not on file  . Physically Abused: Not on file  . Sexually Abused: Not on  file     PHYSICAL EXAM:  VS: BP 110/74   Pulse 79   Ht 5' 7"  (1.702 m)   Wt 265 lb (120.2 kg)   BMI 41.50 kg/m  Physical Exam Gen: NAD, alert, cooperative with exam, well-appearing MSK:  Right knee: No obvious effusion. Normal range of motion. Tender palpation over the medial joint space. Left shoulder: Some limitation with flexion and external rotation. Normal strength resistance. Neurovascularly intact   Aspiration/Injection Procedure Note Starlet Gallentine 1955/04/17  Procedure: Injection Indications: Right knee pain  Procedure Details Consent: Risks of procedure as well as the alternatives and risks of each were explained to the (patient/caregiver).  Consent for procedure obtained. Time Out: Verified patient identification, verified procedure, site/side was marked, verified correct patient position, special equipment/implants available, medications/allergies/relevent history reviewed, required imaging and test results available.  Performed.  The area was cleaned with iodine and alcohol swabs.    The Right knee superior lateral suprapatellar pouch was injected using 1 cc's of 40 mg  Kenalog and 4 cc's of 0.25% bupivacaine with a 21 2" needle.  Ultrasound was used. Images were obtained in long views showing the injection.     A sterile dressing was applied.  Patient did tolerate procedure well.   Aspiration/Injection Procedure Note Mckinzey Entwistle 09-03-1955  Procedure: Injection Indications: Left shoulder pain  Procedure Details Consent: Risks of procedure as well as the alternatives and risks of each were explained to the (patient/caregiver).  Consent for procedure obtained. Time Out: Verified patient identification, verified procedure, site/side was marked, verified correct patient position, special equipment/implants available, medications/allergies/relevent history reviewed, required imaging and test results available.  Performed.  The area was cleaned with iodine  and alcohol swabs.    The left glenohumeral joint was injected using 1 cc's of 40 mg Kenalog and 4 cc's of 0.25% bupivacaine with a 22 3 1/2" needle.  Ultrasound was used. Images were obtained in short views showing the injection.     A sterile dressing was applied.  Patient did tolerate procedure well.     ASSESSMENT & PLAN:   Primary osteoarthritis of left shoulder Acute exacerbation of her underlying degenerative changes.  Effusion noted on ultrasound. -Counseled on home exercise therapy and supportive care. -Injection. -Could consider physical therapy.  OA (osteoarthritis) of knee Acute exacerbation of underlying degenerative changes.  Has surgery scheduled November. -Counseled on home exercise therapy  and supportive care. -Injection. -Pennsaid.

## 2019-12-13 NOTE — Patient Instructions (Signed)
Good to see you Please try ice  Please try the topical medicine I sent in   Please send me a message in MyChart with any questions or updates.  Please see me back in 4-6 weeks.   --Dr. Jordan Likes

## 2019-12-13 NOTE — Assessment & Plan Note (Signed)
Acute exacerbation of her underlying degenerative changes.  Effusion noted on ultrasound. -Counseled on home exercise therapy and supportive care. -Injection. -Could consider physical therapy.

## 2019-12-13 NOTE — Assessment & Plan Note (Signed)
Acute exacerbation of underlying degenerative changes.  Has surgery scheduled November. -Counseled on home exercise therapy and supportive care. -Injection. -Pennsaid.

## 2020-01-02 ENCOUNTER — Other Ambulatory Visit: Payer: Self-pay

## 2020-01-02 ENCOUNTER — Ambulatory Visit (INDEPENDENT_AMBULATORY_CARE_PROVIDER_SITE_OTHER): Payer: BC Managed Care – PPO | Admitting: Family Medicine

## 2020-01-02 ENCOUNTER — Encounter (INDEPENDENT_AMBULATORY_CARE_PROVIDER_SITE_OTHER): Payer: Self-pay | Admitting: Family Medicine

## 2020-01-02 VITALS — BP 108/72 | HR 81 | Temp 98.4°F | Ht 67.0 in | Wt 270.0 lb

## 2020-01-02 DIAGNOSIS — Z9189 Other specified personal risk factors, not elsewhere classified: Secondary | ICD-10-CM | POA: Diagnosis not present

## 2020-01-02 DIAGNOSIS — Z6841 Body Mass Index (BMI) 40.0 and over, adult: Secondary | ICD-10-CM

## 2020-01-02 DIAGNOSIS — E559 Vitamin D deficiency, unspecified: Secondary | ICD-10-CM

## 2020-01-02 DIAGNOSIS — R7303 Prediabetes: Secondary | ICD-10-CM | POA: Diagnosis not present

## 2020-01-02 MED ORDER — METFORMIN HCL 500 MG PO TABS: 500.0000 mg | ORAL_TABLET | Freq: Two times a day (BID) | ORAL | 0 refills | Status: AC

## 2020-01-02 MED ORDER — VITAMIN D (ERGOCALCIFEROL) 1.25 MG (50000 UNIT) PO CAPS: 50000.0000 [IU] | ORAL_CAPSULE | ORAL | 0 refills | Status: AC

## 2020-01-07 ENCOUNTER — Other Ambulatory Visit: Payer: Self-pay | Admitting: Family Medicine

## 2020-01-07 DIAGNOSIS — E2839 Other primary ovarian failure: Secondary | ICD-10-CM

## 2020-01-08 NOTE — Progress Notes (Signed)
Chief Complaint:   OBESITY Laura [name] is here to discuss her progress with her obesity treatment plan along with follow-up of her obesity related diagnoses. Laura [name] is on the Category 2 Plan or following a lower carbohydrate, vegetable and lean protein rich diet plan and states she is following her eating plan approximately 75% of the time. Laura [name] states she is riding the exercise bike for 30 minutes 1-2 times per week.  Today's visit was #: 23 Starting weight: 278 lbs Starting date: 01/17/2018 Today's weight: 270 lbs Today's date: 01/02/2020 Total lbs lost to date: 8 Total lbs lost since last in-office visit: 4  Interim History: Laura [name] continues to do well with weight loss, but she is impatient that is isn't going faster. She needs to reduce her BMI to below 40 to qualify for knee replacement surgery.  Subjective:   1. Vitamin D deficiency Laura [name] is stable on Vit D, but she is at risk of over-replacement.  2. Pre-diabetes Laura [name] is stable on metformin, and she is doing well with diet and weight loss. She denies hypoglycemia.  3. At risk for hyperglycemia Laura [name] is at increased risk for hyperglycemia due to changes in diet, diagnosis of diabetes, and/or insulin use.   Assessment/Plan:   1. Vitamin D deficiency Low Vitamin D level contributes to fatigue and are associated with obesity, breast, and colon cancer. We will refill prescription Vitamin D for 1 month, and we will recheck labs in 3 weeks. Laura [name] will follow-up for routine testing of Vitamin D, at least 2-3 times per year to avoid over-replacement.  - Vitamin D, Ergocalciferol, (DRISDOL) 1.25 MG (50000 UNIT) CAPS capsule; Take 1 capsule (50,000 Units total) by mouth every 7 (seven) days.  Dispense: 4 capsule; Refill: 0  2. Pre-diabetes Laura [name] will continue to work on weight loss, diet, exercise, and decreasing simple carbohydrates to help decrease the risk of diabetes. we will refill metformin for 1 month.   - metFORMIN (GLUCOPHAGE) 500 MG  tablet; Take 1 tablet (500 mg total) by mouth 2 (two) times daily with a meal.  Dispense: 60 tablet; Refill: 0  3. At risk for hyperglycemia Laura [name] was given approximately 15 minutes of counseling today regarding prevention of hyperglycemia. She was advised of hyperglycemia causes and the fact hyperglycemia is often asymptomatic. Laura [name] was instructed to avoid skipping meals, eat regular protein rich meals and schedule low calorie but protein rich snacks as needed.   Repetitive spaced learning was employed today to elicit superior memory formation and behavioral change  4. Class 3 severe obesity with serious comorbidity and body mass index (BMI) of 40.0 to 44.9 in adult, unspecified obesity type (HCC) Laura [name] is currently in the action stage of change. As such, her goal is to continue with weight loss efforts. She has agreed to the Category 2 Plan or following a lower carbohydrate, vegetable and lean protein rich diet plan.   We will recheck fasting labs at her next visit.  Exercise goals: As is.  Behavioral modification strategies: increasing lean protein intake and no skipping meals.  Laura [name] has agreed to follow-up with our clinic in 3 weeks. She was informed of the importance of frequent follow-up visits to maximize her success with intensive lifestyle modifications for her multiple health conditions.   Objective:   Blood pressure 108/72, pulse 81, temperature 98.4 F (36.9 C), height 5\' 7"  (1.702 m), weight 270 lb (122.5 kg), SpO2 96 %. Body mass index is 42.29 kg/m.  General: Cooperative, alert, well developed, in no  acute distress. HEENT: Conjunctivae and lids unremarkable. Cardiovascular: Regular rhythm.  Lungs: Normal work of breathing. Neurologic: No focal deficits.   Lab Results  Component Value Date   CREATININE 1.01 (H) 08/26/2019   BUN 22 08/26/2019   NA 136 08/26/2019   K 4.1 08/26/2019   CL 101 08/26/2019   CO2 26 08/26/2019   Lab Results  Component Value Date   ALT 17  08/26/2019   AST 21 08/26/2019   ALKPHOS 53 08/26/2019   BILITOT 0.6 08/26/2019   Lab Results  Component Value Date   HGBA1C 6.1 (H) 08/26/2019   HGBA1C 5.9 (H) 05/16/2019   HGBA1C 5.7 (H) 11/12/2018   HGBA1C 5.9 (H) 06/21/2018   HGBA1C 5.8 (H) 01/17/2018   Lab Results  Component Value Date   INSULIN 15.1 05/16/2019   INSULIN 11.8 11/12/2018   INSULIN 13.9 06/21/2018   INSULIN 8.4 01/17/2018   Lab Results  Component Value Date   TSH 1.190 01/17/2018   Lab Results  Component Value Date   CHOL 181 05/16/2019   HDL 78 05/16/2019   LDLCALC 94 05/16/2019   TRIG 46 05/16/2019   Lab Results  Component Value Date   WBC 9.2 08/26/2019   HGB 11.3 (L) 08/26/2019   HCT 37.3 08/26/2019   MCV 94.4 08/26/2019   PLT 374 08/26/2019   Lab Results  Component Value Date   IRON 41 (L) 11/20/2008   TIBC 319 11/20/2008   FERRITIN 29 11/20/2008   Attestation Statements:   Reviewed by clinician on day of visit: allergies, medications, problem list, medical history, surgical history, family history, social history, and previous encounter notes.   I, Burt Knack, am acting as transcriptionist for Quillian Quince, MD.  I have reviewed the above documentation for accuracy and completeness, and I agree with the above. -  Quillian Quince, MD

## 2020-01-24 ENCOUNTER — Ambulatory Visit: Payer: BC Managed Care – PPO | Admitting: Family Medicine

## 2020-01-24 NOTE — Progress Notes (Deleted)
Laura [name] - 64 y.o. female MRN [id number]  Date of birth: 22-May-1955  SUBJECTIVE:  Including CC & ROS.  No chief complaint on file.   Laura [name] is a 64 y.o. female that is  ***.  ***   Review of Systems See HPI   HISTORY: Past Medical, Surgical, Social, and Family History Reviewed & Updated per EMR.   Pertinent Historical Findings include:  Past Medical History:  Diagnosis Date  . Anemia, normocytic normochromic 07/04/2017   Chronic Hb 10 +/- 0.5 gms  . Edema   . 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 Herpes   . High blood pressure   . History of blood clots   . Obesity   . Partial tear of Achilles tendon   . Pneumonia   . 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 Pulmonary embolism ([place])   . 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 Vitamin D deficiency     Past Surgical History:  Procedure Laterality Date  . BREAST BIOPSY    . [place]  . 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 Hypertension Mother   . Heart failure Mother   . Cancer Mother   . Depression Mother   . Sleep apnea Mother   . Obesity Mother   . Prostate cancer Father   . Multiple myeloma Father   . Heart failure Father   . Obesity Father   . Sleep apnea Father   . Kidney disease Father   . Sudden Cardiac Death Father   . Hypertension Father   . Heart attack Neg Hx   . Hyperlipidemia Neg Hx   . Diabetes Neg Hx   . Sudden death Neg Hx   . Breast cancer Neg Hx     Social History   Socioeconomic History  . Marital status: Married    Spouse name: Laura [name]  . Number of children: 2  . Years of education: Not on file  . Highest education level:  Not on file  Occupational History  . Occupation: Engineer, building services: [name]  Tobacco Use  . Smoking status: Never Smoker  . Smokeless tobacco: Never Used  Vaping Use  . Vaping Use: Never used  Substance and Sexual Activity  . Alcohol use: Yes    Alcohol/week: 0.0 standard drinks    Comment: Rarely.  . Drug use: No  . Sexual activity: Not on file  Other Topics Concern  . Not on file  Social History Narrative  . Not on file   Social Determinants of Health   Financial Resource Strain:   . Difficulty of Paying Living Expenses: Not on file  Food Insecurity:   . Worried About Charity fundraiser in the Last Year: Not on file  . Ran Out of Food in the Last Year: Not on file  Transportation Needs:   . Lack of Transportation (Medical): Not on file  . Lack of Transportation (Non-Medical): Not on file  Physical Activity:   . Days of Exercise per Week: Not on file  . Minutes of Exercise per Session: Not on file  Stress:   .  Feeling of Stress : Not on file  Social Connections:   . Frequency of Communication with Friends and Family: Not on file  . Frequency of Social Gatherings with Friends and Family: Not on file  . Attends Religious Services: Not on file  . Active Member of Clubs or Organizations: Not on file  . Attends Archivist Meetings: Not on file  . Marital Status: Not on file  Intimate Partner Violence:   . Fear of Current or Ex-Partner: Not on file  . Emotionally Abused: Not on file  . Physically Abused: Not on file  . Sexually Abused: Not on file     PHYSICAL EXAM:  VS: There were no vitals taken for this visit. Physical Exam Gen: NAD, alert, cooperative with exam, well-appearing MSK:  ***      ASSESSMENT & PLAN:   No problem-specific Assessment & Plan notes found for this encounter.

## 2020-01-28 ENCOUNTER — Ambulatory Visit (INDEPENDENT_AMBULATORY_CARE_PROVIDER_SITE_OTHER): Payer: BC Managed Care – PPO | Admitting: Family Medicine

## 2020-02-03 ENCOUNTER — Encounter (HOSPITAL_COMMUNITY): Admission: RE | Admit: 2020-02-03 | Payer: BC Managed Care – PPO | Source: Ambulatory Visit

## 2020-03-24 ENCOUNTER — Encounter (HOSPITAL_COMMUNITY): Admission: RE | Admit: 2020-03-24 | Payer: BC Managed Care – PPO | Source: Ambulatory Visit

## 2020-04-02 ENCOUNTER — Ambulatory Visit (INDEPENDENT_AMBULATORY_CARE_PROVIDER_SITE_OTHER): Payer: BC Managed Care – PPO | Admitting: Family Medicine

## 2020-04-02 ENCOUNTER — Other Ambulatory Visit: Payer: Self-pay

## 2020-04-02 DIAGNOSIS — M19012 Primary osteoarthritis, left shoulder: Secondary | ICD-10-CM

## 2020-04-02 DIAGNOSIS — M1711 Unilateral primary osteoarthritis, right knee: Secondary | ICD-10-CM

## 2020-04-02 NOTE — Progress Notes (Signed)
Laura [name] - 65 y.o. female MRN [id number]  Date of birth: [birth year]  SUBJECTIVE:  Including CC & ROS.  No chief complaint on file.   Laura [name] is a 65 y.o. female that is presenting with acute on chronic right knee pain and left shoulder pain.  She does have surgery scheduled next month for her review.  She does not want to have to keep getting injections for the shoulder.  Denies any falls or injuries in the interim.   Review of Systems See HPI   HISTORY: Past Medical, Surgical, Social, and Family History Reviewed & Updated per EMR.   Pertinent Historical Findings include:  Past Medical History:  Diagnosis Date  . Anemia, normocytic normochromic 07/04/2017   Chronic Hb 10 +/- 0.5 gms  . Edema   . 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 Herpes   . High blood pressure   . History of blood clots   . Obesity   . Partial tear of Achilles tendon   . Pneumonia   . 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 Pulmonary embolism ([place])   . 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 Vitamin D deficiency     Past Surgical History:  Procedure Laterality Date  . BREAST BIOPSY    . [place]  . 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 Hypertension Mother   . Heart failure Mother   . Cancer Mother   . Depression Mother   . Sleep apnea Mother   . Obesity Mother   . Prostate cancer Father   . Multiple myeloma Father   . Heart failure Father   . Obesity Father   . Sleep apnea Father   . Kidney disease Father   . Sudden Cardiac Death Father   . Hypertension Father   . Heart attack Neg Hx   . Hyperlipidemia Neg Hx   . Diabetes Neg Hx   . Sudden  death Neg Hx   . Breast cancer Neg Hx     Social History   Socioeconomic History  . Marital status: Married    Spouse name: Johnathan Tortorelli  . Number of children: 2  . Years of education: Not on file  . Highest education level: Not on file  Occupational History  . Occupation: Engineer, building services: [employer]  Tobacco Use  . Smoking status: Never Smoker  . Smokeless tobacco: Never Used  Vaping Use  . Vaping Use: Never used  Substance and Sexual Activity  . Alcohol use: Yes    Alcohol/week: 0.0 standard drinks    Comment: Rarely.  . Drug use: No  . Sexual activity: Not on file  Other Topics Concern  . Not on file  Social History Narrative  . Not on file   Social Determinants of Health   Financial Resource Strain: Not on file  Food Insecurity: Not on file  Transportation Needs: Not on file  Physical Activity: Not on file  Stress: Not on file  Social Connections: Not on file  Intimate Partner Violence: Not on file     PHYSICAL EXAM:  VS: BP 120/86  Ht 5' 6.5" (1.689 m)   Wt 255 lb (115.7 kg)   BMI 40.54 kg/m  Physical Exam Gen: NAD, alert, cooperative with exam, well-appearing   ASSESSMENT & PLAN:   OA (osteoarthritis) of knee Acute on chronic in nature.  She has surgery scheduled for next month so we will hold off on the injection. -Counseled on home exercise therapy and supportive care. -Pennsaid samples.   Primary osteoarthritis of left shoulder Acute on chronic in nature. -Could consider physical therapy.

## 2020-04-02 NOTE — Patient Instructions (Signed)
Good to see you Good luck with your surgery  Please send me a message in MyChart with any questions or updates.  Please see [place] back if your shoulder acts up after your surgery.   --Dr. Jordan Likes

## 2020-04-02 NOTE — Progress Notes (Signed)
Medication Samples have been provided to the patient.  Drug name: pennsaid       Strength: 2%        Qty: 3 bxs  LOT: [id number] Exp.Date: 5/22  Dosing instructions: use a pea size amt and rub gently  The patient has been instructed regarding the correct time, dose, and frequency of taking this medication, including desired effects and most common side effects.   Laura [name] 4:09 PM 04/02/2020

## 2020-04-03 NOTE — Assessment & Plan Note (Signed)
Acute on chronic in nature.  She has surgery scheduled for next month so we will hold off on the injection. -Counseled on home exercise therapy and supportive care. -Pennsaid samples.

## 2020-04-03 NOTE — Assessment & Plan Note (Signed)
Acute on chronic in nature. -Could consider physical therapy.

## 2020-04-27 NOTE — Progress Notes (Signed)
DUE TO COVID-19 ONLY ONE VISITOR IS ALLOWED TO COME WITH YOU AND STAY IN THE WAITING ROOM ONLY DURING PRE OP AND PROCEDURE DAY OF SURGERY. THE 1 VISITOR  MAY VISIT WITH YOU AFTER SURGERY IN YOUR PRIVATE ROOM DURING VISITING HOURS ONLY!  YOU NEED TO HAVE A COVID 19 TEST ON_2/24/2022 ______ @_______ , THIS TEST MUST BE DONE BEFORE SURGERY,  COVID TESTING SITE [address] [place] , IT IS ON THE RIGHT GOING OUT [address] APPROXIMATELY  2 MINUTES PAST [employer] ON THE RIGHT. ONCE YOUR COVID TEST IS COMPLETED,  PLEASE BEGIN THE QUARANTINE INSTRUCTIONS AS OUTLINED IN YOUR HANDOUT.                Laura [name]  04/27/2020   Your procedure is scheduled on: 05/11/2020   Report to [facility] Main  Entrance   Report to admitting at      0600 AM     Call this number if you have problems the morning of surgery [phone]    REMEMBER: NO  SOLID FOOD CANDY OR GUM AFTER MIDNIGHT. CLEAR LIQUIDS UNTIL   0520am         . NOTHING BY MOUTH EXCEPT CLEAR LIQUIDS UNTIL    . PLEASE FINISH G2 DRINK PER SURGEON ORDER  WHICH NEEDS TO BE COMPLETED AT   05220am    .      CLEAR LIQUID DIET   Foods Allowed                                                                    Coffee and tea, regular and decaf                            Fruit ices (not with fruit pulp)                                      Iced Popsicles                                    Carbonated beverages, regular and diet                                    Cranberry, grape and apple juices Sports drinks like Gatorade Lightly seasoned clear broth or consume(fat free) Sugar, honey syrup ___________________________________________________________________      BRUSH YOUR TEETH MORNING OF SURGERY AND RINSE YOUR MOUTH OUT, NO CHEWING GUM CANDY OR MINTS.     Take these medicines the morning of surgery with A SIP OF WATER: inhalers as usual and bring   DO NOT TAKE ANY DIABETIC MEDICATIONS DAY OF YOUR  SURGERY                               You may not have any metal on your body including hair pins and  piercings  Do not wear jewelry, make-up, lotions, powders or perfumes, deodorant             Do not wear nail polish on your fingernails.  Do not shave  48 hours prior to surgery.              Men may shave face and neck.   Do not bring valuables to the hospital. [place].  Contacts, dentures or bridgework may not be worn into surgery.  Leave suitcase in the car. After surgery it may be brought to your room.     Patients discharged the day of surgery will not be allowed to drive home. IF YOU ARE HAVING SURGERY AND GOING HOME THE SAME DAY, YOU MUST HAVE AN ADULT TO DRIVE YOU HOME AND BE WITH YOU FOR 24 HOURS. YOU MAY GO HOME BY TAXI OR UBER OR ORTHERWISE, BUT AN ADULT MUST ACCOMPANY YOU HOME AND STAY WITH YOU FOR 24 HOURS.  Name and phone number of your driver:  Special Instructions: N/A              Please read over the following fact sheets you were given: _____________________________________________________________________  [facility] - Preparing for Surgery Before surgery, you can play an important role.  Because skin is not sterile, your skin needs to be as free of germs as possible.  You can reduce the number of germs on your skin by washing with CHG (chlorahexidine gluconate) soap before surgery.  CHG is an antiseptic cleaner which kills germs and bonds with the skin to continue killing germs even after washing. Please DO NOT use if you have an allergy to CHG or antibacterial soaps.  If your skin becomes reddened/irritated stop using the CHG and inform your nurse when you arrive at Short Stay. Do not shave (including legs and underarms) for at least 48 hours prior to the first CHG shower.  You may shave your face/neck. Please follow these instructions carefully:  1.  Shower with CHG Soap the night before surgery and the   morning of Surgery.  2.  If you choose to wash your hair, wash your hair first as usual with your  normal  shampoo.  3.  After you shampoo, rinse your hair and body thoroughly to remove the  shampoo.                           4.  Use CHG as you would any other liquid soap.  You can apply chg directly  to the skin and wash                       Gently with a scrungie or clean washcloth.  5.  Apply the CHG Soap to your body ONLY FROM THE NECK DOWN.   Do not use on face/ open                           Wound or open sores. Avoid contact with eyes, ears mouth and genitals (private parts).                       Wash face,  Genitals (private parts) with your normal soap.             6.  Wash thoroughly, paying special attention to the area where your surgery  will be performed.  7.  Thoroughly rinse your body with warm water from the neck down.  8.  DO NOT shower/wash with your normal soap after using and rinsing off  the CHG Soap.                9.  Pat yourself dry with a clean towel.            10.  Wear clean pajamas.            11.  Place clean sheets on your bed the night of your first shower and do not  sleep with pets. Day of Surgery : Do not apply any lotions/deodorants the morning of surgery.  Please wear clean clothes to the hospital/surgery center.  FAILURE TO FOLLOW THESE INSTRUCTIONS MAY RESULT IN THE CANCELLATION OF YOUR SURGERY PATIENT SIGNATURE_________________________________  NURSE SIGNATURE__________________________________  ________________________________________________________________________

## 2020-04-29 ENCOUNTER — Encounter (HOSPITAL_COMMUNITY)
Admission: RE | Admit: 2020-04-29 | Discharge: 2020-04-29 | Disposition: A | Discharge status: Home / Self Care | Payer: BC Managed Care – PPO | Source: Ambulatory Visit | Attending: Orthopedic Surgery | Admitting: Orthopedic Surgery

## 2020-04-29 ENCOUNTER — Other Ambulatory Visit: Payer: Self-pay

## 2020-04-29 ENCOUNTER — Encounter (HOSPITAL_COMMUNITY): Payer: Self-pay

## 2020-04-29 DIAGNOSIS — Z01812 Encounter for preprocedural laboratory examination: Secondary | ICD-10-CM | POA: Diagnosis not present

## 2020-04-29 HISTORY — DX: Unspecified osteoarthritis, unspecified site: M19.90

## 2020-04-29 HISTORY — DX: Unspecified asthma, uncomplicated: J45.909

## 2020-04-29 HISTORY — DX: Prediabetes: R73.03

## 2020-04-29 LAB — COMPREHENSIVE METABOLIC PANEL
ALT: 12 U/L (ref 0–44)
AST: 20 U/L (ref 15–41)
Albumin: 3.7 g/dL (ref 3.5–5.0)
Alkaline Phosphatase: 50 U/L (ref 38–126)
Anion gap: 9 (ref 5–15)
BUN: 25 mg/dL — ABNORMAL HIGH (ref 8–23)
CO2: 27 mmol/L (ref 22–32)
Calcium: 9.5 mg/dL (ref 8.9–10.3)
Chloride: 99 mmol/L (ref 98–111)
Creatinine, Ser: 0.97 mg/dL (ref 0.44–1.00)
GFR, Estimated: >60 mL/min (ref >60)
Glucose, Bld: 97 mg/dL (ref 70–99)
Potassium: 3.8 mmol/L (ref 3.5–5.1)
Sodium: 135 mmol/L (ref 135–145)
Total Bilirubin: 0.8 mg/dL (ref 0.3–1.2)
Total Protein: 8 g/dL (ref 6.5–8.1)

## 2020-04-29 LAB — PROTIME-INR
INR: 1.7 — ABNORMAL HIGH (ref 0.8–1.2)
Prothrombin Time: 19.6 seconds — ABNORMAL HIGH (ref 11.4–15.2)

## 2020-04-29 LAB — CBC
HCT: 38.8 % (ref 36.0–46.0)
Hemoglobin: 12 g/dL (ref 12.0–15.0)
MCH: 29.5 pg (ref 26.0–34.0)
MCHC: 30.9 g/dL (ref 30.0–36.0)
MCV: 95.3 fL (ref 80.0–100.0)
Platelets: 311 10*3/uL (ref 150–400)
RBC: 4.07 MIL/uL (ref 3.87–5.11)
RDW: 14 % (ref 11.5–15.5)
WBC: 8.1 10*3/uL (ref 4.0–10.5)
nRBC: 0 % (ref 0.0–0.2)

## 2020-04-29 LAB — SURGICAL PCR SCREEN
MRSA, PCR: NEGATIVE
Staphylococcus aureus: NEGATIVE

## 2020-04-29 LAB — TYPE AND SCREEN
ABO/RH(D): B POS
Antibody Screen: NEGATIVE

## 2020-04-29 LAB — APTT: aPTT: 35 seconds (ref 24–36)

## 2020-04-29 LAB — GLUCOSE, CAPILLARY: Glucose-Capillary: 87 mg/dL (ref 70–99)

## 2020-04-29 NOTE — Progress Notes (Addendum)
             Anesthesia Review:  PCP: DR Aram Beecham white LOV 12/2019 per pt  Requested LOV on 04/29/20  Cardiologist : Chest x-ray : EKG : 08/26/2019  Echo : Stress test: Cardiac Cath :  Activity level: can do a flight of stairs without difficulty  Sleep Study/ CPAP : no  Fasting Blood Sugar :      / Checks Blood Sugar -- times a day:   Blood Thinner/ Instructions /Last Dose: ASA / Instructions/ Last Dose :  Xarelto- Pt was instructed to call DR Laurann Montana who handles Xarelto and receive preop instructgions regarding Xarelto.  Pt voiced understanding.   Prediabetes per pt  HGBA1c-04/29/20- 5.7 Dental procedure on 04/28/20 per pt will be on 2 antibioitics.  Antibiotics will be completed prior to surgery per pt .  PT/INR done 04/29/20 faxed to DR Aluisio.

## 2020-04-30 LAB — HEMOGLOBIN A1C
Hgb A1c MFr Bld: 5.7 % — ABNORMAL HIGH (ref 4.8–5.6)
Mean Plasma Glucose: 117 mg/dL

## 2020-05-06 NOTE — H&P (Signed)
TOTAL KNEE ADMISSION H&P  Patient is being admitted for right total knee arthroplasty.  Subjective:  Chief Complaint: Right knee pain.  HPI: Laura [name], 65 y.o. female has a history of pain and functional disability in the right knee due to arthritis and has failed non-surgical conservative treatments for greater than 12 weeks to include corticosteriod injections and activity modification. Onset of symptoms was gradual, starting >10 years ago with gradually worsening course since that time. The patient noted no past surgery on the right knee.  Patient currently rates pain in the right knee at 8 out of 10 with activity. Patient has worsening of pain with activity and weight bearing, pain with passive range of motion and crepitus. Patient has evidence of bone-on-bone arthritis medially and laterally with varus deformity and large osteophytes by imaging studies. There is no active infection.  Patient Active Problem List   Diagnosis Date Noted  . Primary osteoarthritis of left shoulder 03/20/2019  . Other fatigue 01/17/2018  . Shortness of breath on exertion 01/17/2018  . 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 Anemia, normocytic normochromic 07/04/2017  . OA (osteoarthritis) of knee 09/05/2016  . Morbid obesity ([place]) 11/26/2014  . Right Achilles tendinitis 08/21/2014  . Pulmonary embolus ([place]) 05/30/2011  . Coagulopathy ([place]) 05/30/2011  . 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 Dyspnea 04/01/2011  . Osteoarthritis of knees, bilateral 12/09/2009  . HYPERTROPHY OF FAT PAD, KNEE 12/09/2009    Past Medical History:  Diagnosis Date  . Anemia, normocytic normochromic 07/04/2017   Chronic Hb 10 +/- 0.5 gms  . Arthritis   . Asthma    exercise induced   . Edema   . 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 Herpes   . High blood pressure   . History of blood clots   . Obesity   . Partial tear of Achilles tendon   . Pneumonia   . 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 Pre-diabetes   . Pulmonary embolism ([place])   . 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 Vitamin D deficiency     Past Surgical History:  Procedure Laterality Date  . BREAST BIOPSY     breast biopsy   . [facility]  . 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   Procedure: LEFT TOTAL KNEE ARTHROPLASTY;  Surgeon: Gaynelle Arabian, MD;  Location: [facility] ORS;  Service: Orthopedics;  Laterality: Left;    Prior to Admission medications   Medication Sig Start Date End Date Taking? Authorizing Provider  albuterol (VENTOLIN HFA) 108 (90 Base) MCG/ACT inhaler Inhale 1-2 puffs into the lungs every 6 (six) hours as needed for wheezing or shortness of breath.   Yes [provider]  Diclofenac Sodium (PENNSAID) 2 % SOLN Place 1 application onto the skin 2 (two) times daily. Patient taking differently: Place 1 application onto the skin daily as needed (Knee pain). 12/13/19  Yes Rosemarie Ax, MD  hydrochlorothiazide (HYDRODIURIL) 25 MG tablet Take 25 mg by mouth daily.   Yes [provider]  lisinopril (ZESTRIL) 10 MG tablet Take 10 mg by mouth daily.   Yes [provider]  rivaroxaban (XARELTO) 10 MG TABS tablet Take 1 tablet (10 mg total) by mouth daily. Patient taking differently: Take 10 mg by mouth every evening. 07/04/17  Yes Gran[place], Alyson Locket, MD  Semaglutide, 1 MG/DOSE, (  OZEMPIC, 1 MG/DOSE,) 2 MG/1.5ML SOPN Inject 1 mg into the skin once a week.   Yes [provider]  triamcinolone cream (KENALOG) 0.1 % Apply 1 application topically daily as needed. 08/07/19  Yes [provider]  Vitamin D, Ergocalciferol, (DRISDOL) 1.25 MG (50000 UNIT) CAPS capsule Take 1 capsule (50,000 Units total) by mouth every 7 (seven) days. 01/02/20  Yes Dennard Nip D, MD  metFORMIN (GLUCOPHAGE) 500 MG tablet Take 1 tablet (500 mg total) by mouth 2 (two)  times daily with a meal. Patient not taking: No sig reported 01/02/20   Starlyn Skeans, MD    No Known Allergies  Social History   Socioeconomic History  . Marital status: Married    Spouse name: Tanasha Menees  . Number of children: 2  . Years of education: Not on file  . Highest education level: Not on file  Occupational History  . Occupation: Engineer, building services: [employer]  Tobacco Use  . Smoking status: Never Smoker  . Smokeless tobacco: Never Used  Vaping Use  . Vaping Use: Never used  Substance and Sexual Activity  . Alcohol use: Yes    Alcohol/week: 0.0 standard drinks    Comment: Rarely.  . Drug use: No  . Sexual activity: Not on file  Other Topics Concern  . Not on file  Social History Narrative  . Not on file   Social Determinants of Health   Financial Resource Strain: Not on file  Food Insecurity: Not on file  Transportation Needs: Not on file  Physical Activity: Not on file  Stress: Not on file  Social Connections: Not on file  Intimate Partner Violence: Not on file    Tobacco Use: Low Risk   . Smoking Tobacco Use: Never Smoker  . Smokeless Tobacco Use: Never Used   Social History   Substance and Sexual Activity  Alcohol Use Yes  . Alcohol/week: 0.0 standard drinks   Comment: Rarely.    Family History  Problem Relation Age of Onset  . Hypertension Mother   . Heart failure Mother   . Cancer Mother   . Depression Mother   . Sleep apnea Mother   . Obesity Mother   . Prostate cancer Father   . Multiple myeloma Father   . Heart failure Father   . Obesity Father   . Sleep apnea Father   . Kidney disease Father   . Sudden Cardiac Death Father   . Hypertension Father   . Heart attack Neg Hx   . Hyperlipidemia Neg Hx   . Diabetes Neg Hx   . Sudden death Neg Hx   . Breast cancer Neg Hx     Review of Systems  Constitutional: Negative for chills and fever.  HENT: Negative for congestion, sore throat and tinnitus.   Eyes:  Negative for double vision, photophobia and pain.  Respiratory: Negative for cough, shortness of breath and wheezing.   Cardiovascular: Negative for chest pain, palpitations and orthopnea.  Gastrointestinal: Negative for heartburn, nausea and vomiting.  Genitourinary: Negative for dysuria, frequency and urgency.  Musculoskeletal: Positive for joint pain.  Neurological: Negative for dizziness, weakness and headaches.    Objective:  Physical Exam: Well nourished and well developed.  General: Alert and oriented x3, cooperative and pleasant, no acute distress.  Head: normocephalic, atraumatic, neck supple.  Eyes: EOMI.  Respiratory: breath sounds clear in all fields, no wheezing, rales, or rhonchi. Cardiovascular: Regular rate and rhythm, no murmurs, gallops or rubs.  Abdomen: non-tender to palpation and soft, normoactive bowel sounds. Musculoskeletal:  Right Knee Exam:  No effusion present. No swelling present.  Range of motion is from 10 to 115 degrees.  Moderate crepitus with range of motion.  Medial greater than lateral tenderness.  The knee is stable.  Calves soft and nontender. Motor function intact in LE. Strength 5/5 LE bilaterally. Neuro: Distal pulses 2+. Sensation to light touch intact in LE.  Imaging Review Plain radiographs demonstrate severe degenerative joint disease of the right knee. The overall alignment is neutral. The bone quality appears to be adequate for age and reported activity level.  Assessment/Plan:  End stage arthritis, right knee   The patient history, physical examination, clinical judgment of the provider and imaging studies are consistent with end stage degenerative joint disease of the right knee and total knee arthroplasty is deemed medically necessary. The treatment options including medical management, injection therapy arthroscopy and arthroplasty were discussed at length. The risks and benefits of total knee arthroplasty were presented and  reviewed. The risks due to aseptic loosening, infection, stiffness, patella tracking problems, thromboembolic complications and other imponderables were discussed. The patient acknowledged the explanation, agreed to proceed with the plan and consent was signed. Patient is being admitted for inpatient treatment for surgery, pain control, PT, OT, prophylactic antibiotics, VTE prophylaxis, progressive ambulation and ADLs and discharge planning. The patient is planning to be discharged home.   Patient's anticipated LOS is less than 2 midnights, meeting these requirements: - Younger than 86 - Lives within 1 hour of care - Has a competent adult at home to recover with post-op recover - NO history of  - Chronic pain requiring opiods  - Diabetes  - Coronary Artery Disease  - Heart failure  - Heart attack  - Stroke  - DVT/VTE  - Cardiac arrhythmia  - Respiratory Failure/COPD  - Renal failure  - Anemia  - Advanced Liver disease  Therapy Plans: Outpatient therapy at [facility] Disposition: Home with husband Planned DVT Prophylaxis: Xarelto 10 mg QD DME Needed: None PCP: Harlan Stains, MD TXA: Topical  Allergies: NKDA Anesthesia Concerns: None BMI: 41.2 Last HgbA1c: 5.7% Pharmacy: [facility] ([address])  Other: - Takes 10 mg Xarelto QD (hx of PE) - Stopping Xarelto one day before surgery per Dr. Dema Severin - Send with zofran  - Oxycodone with left knee  - Patient was instructed on what medications to stop prior to surgery. - Follow-up visit in 2 weeks with Dr. Wynelle Link - Begin physical therapy following surgery - Pre-operative lab work as pre-surgical testing - Prescriptions will be provided in hospital at time of discharge  Theresa Duty, PA-C Orthopedic Surgery [facility] [place]

## 2020-05-07 ENCOUNTER — Other Ambulatory Visit (HOSPITAL_COMMUNITY)
Admission: RE | Admit: 2020-05-07 | Discharge: 2020-05-07 | Disposition: A | Discharge status: Home / Self Care | Payer: BC Managed Care – PPO | Source: Ambulatory Visit | Attending: Orthopedic Surgery | Admitting: Orthopedic Surgery

## 2020-05-07 DIAGNOSIS — Z20822 Contact with and (suspected) exposure to covid-19: Secondary | ICD-10-CM | POA: Insufficient documentation

## 2020-05-07 DIAGNOSIS — Z01812 Encounter for preprocedural laboratory examination: Secondary | ICD-10-CM | POA: Insufficient documentation

## 2020-05-08 LAB — SARS CORONAVIRUS 2 (TAT 6-24 HRS): SARS Coronavirus 2: NEGATIVE

## 2020-05-10 MED ORDER — BUPIVACAINE LIPOSOME 1.3 % IJ SUSP
20.0000 mL | Freq: Once | INTRAMUSCULAR | Status: DC
  Filled 2020-05-10: qty 20

## 2020-05-10 MED ORDER — TRANEXAMIC ACID 1000 MG/10ML IV SOLN
2000.0000 mg | Freq: Once | INTRAVENOUS | Status: DC
  Filled 2020-05-10: qty 20

## 2020-05-10 NOTE — Anesthesia Preprocedure Evaluation (Addendum)
Anesthesia Evaluation  Patient identified by MRN, date of birth, ID band Patient awake    Reviewed: Allergy & Precautions, NPO status , Patient's Chart, lab work & pertinent test results  Airway Mallampati: II  TM Distance: >3 FB Neck ROM: Full    Dental no notable dental hx. (+) Teeth Intact, Dental Advisory Given   Pulmonary shortness of breath and with exertion, asthma (exercise induced) , PE (2013, on xarelto- LD: 2/26 )   Pulmonary exam normal breath sounds clear to auscultation       Cardiovascular hypertension, Pt. on medications Normal cardiovascular exam Rhythm:Regular Rate:Normal  Echo 2016 nml, LVEF 55%   Neuro/Psych negative neurological ROS  negative psych ROS   GI/Hepatic negative GI ROS, Neg liver ROS,   Endo/Other  Pre-diabetic   Renal/GU negative Renal ROS  negative genitourinary   Musculoskeletal  (+) Arthritis , Osteoarthritis,  R knee OA   Abdominal (+) + obese,   Peds  Hematology Polyclonal gammopathy hct 38.8, plt 311   Anesthesia Other Findings   Reproductive/Obstetrics negative OB ROS                            Anesthesia Physical Anesthesia Plan  ASA: II  Anesthesia Plan: Regional and General   Post-op Pain Management: GA combined w/ Regional for post-op pain   Induction:   PONV Risk Score and Plan: 3 and Ondansetron, Dexamethasone, Midazolam and Treatment may vary due to age or medical condition  Airway Management Planned: LMA  Additional Equipment: None  Intra-op Plan:   Post-operative Plan: Extubation in OR  Informed Consent: I have reviewed the patients History and Physical, chart, labs and discussed the procedure including the risks, benefits and alternatives for the proposed anesthesia with the patient or authorized representative who has indicated his/her understanding and acceptance.     Dental advisory given  Plan Discussed with:  CRNA  Anesthesia Plan Comments: (Took xarelto <72h ago, will proceed with general anesthesia )       Anesthesia Quick Evaluation

## 2020-05-11 ENCOUNTER — Encounter (HOSPITAL_COMMUNITY): Payer: Self-pay | Admitting: Orthopedic Surgery

## 2020-05-11 ENCOUNTER — Ambulatory Visit (HOSPITAL_COMMUNITY): Payer: BC Managed Care – PPO | Admitting: Physician Assistant

## 2020-05-11 ENCOUNTER — Observation Stay (HOSPITAL_COMMUNITY)
Admission: RE | Admit: 2020-05-11 | Discharge: 2020-05-12 | Disposition: A | Discharge status: Home / Self Care | Payer: BC Managed Care – PPO | Source: Ambulatory Visit | Attending: Orthopedic Surgery | Admitting: Orthopedic Surgery

## 2020-05-11 ENCOUNTER — Other Ambulatory Visit: Payer: Self-pay

## 2020-05-11 ENCOUNTER — Encounter (HOSPITAL_COMMUNITY)
Admission: RE | Disposition: A | Discharge status: Home / Self Care | Payer: Self-pay | Source: Ambulatory Visit | Attending: Orthopedic Surgery

## 2020-05-11 DIAGNOSIS — Z96652 Presence of left artificial knee joint: Secondary | ICD-10-CM | POA: Diagnosis not present

## 2020-05-11 DIAGNOSIS — Z7984 Long term (current) use of oral hypoglycemic drugs: Secondary | ICD-10-CM | POA: Diagnosis not present

## 2020-05-11 DIAGNOSIS — I10 Essential (primary) hypertension: Secondary | ICD-10-CM | POA: Diagnosis not present

## 2020-05-11 DIAGNOSIS — J45909 Unspecified asthma, uncomplicated: Secondary | ICD-10-CM | POA: Diagnosis not present

## 2020-05-11 DIAGNOSIS — R7309 Other abnormal glucose: Secondary | ICD-10-CM | POA: Insufficient documentation

## 2020-05-11 DIAGNOSIS — M1711 Unilateral primary osteoarthritis, right knee: Principal | ICD-10-CM | POA: Diagnosis present

## 2020-05-11 DIAGNOSIS — Z79899 Other long term (current) drug therapy: Secondary | ICD-10-CM | POA: Insufficient documentation

## 2020-05-11 HISTORY — PX: TOTAL KNEE ARTHROPLASTY: SHX125

## 2020-05-11 LAB — GLUCOSE, CAPILLARY: Glucose-Capillary: 86 mg/dL (ref 70–99)

## 2020-05-11 SURGERY — ARTHROPLASTY, KNEE, TOTAL
Anesthesia: Regional | Site: Knee | Laterality: Right

## 2020-05-11 MED ORDER — PROPOFOL 500 MG/50ML IV EMUL
INTRAVENOUS | Status: AC
  Filled 2020-05-11: qty 50

## 2020-05-11 MED ORDER — ACETAMINOPHEN 10 MG/ML IV SOLN: 1000.0000 mg | Freq: Four times a day (QID) | INTRAVENOUS | Status: DC

## 2020-05-11 MED ORDER — LIDOCAINE HCL (PF) 2 % IJ SOLN
INTRAMUSCULAR | Status: AC
  Filled 2020-05-11: qty 5

## 2020-05-11 MED ORDER — POVIDONE-IODINE 10 % EX SWAB
2.0000 "application " | Freq: Once | CUTANEOUS | Status: AC
  Administered 2020-05-11: 2 via TOPICAL

## 2020-05-11 MED ORDER — RIVAROXABAN 10 MG PO TABS
10.0000 mg | ORAL_TABLET | Freq: Every day | ORAL | Status: DC
  Administered 2020-05-12: 10 mg via ORAL
  Filled 2020-05-11: qty 1

## 2020-05-11 MED ORDER — ROPIVACAINE HCL 5 MG/ML IJ SOLN
INTRAMUSCULAR | Status: DC | PRN
  Administered 2020-05-11: 30 mL via PERINEURAL

## 2020-05-11 MED ORDER — MENTHOL 3 MG MT LOZG: 1.0000 | LOZENGE | OROMUCOSAL | Status: DC | PRN

## 2020-05-11 MED ORDER — HYDROCHLOROTHIAZIDE 25 MG PO TABS
25.0000 mg | ORAL_TABLET | Freq: Every day | ORAL | Status: DC
  Filled 2020-05-11: qty 1

## 2020-05-11 MED ORDER — PHENYLEPHRINE HCL-NACL 10-0.9 MG/250ML-% IV SOLN
INTRAVENOUS | Status: DC | PRN
  Administered 2020-05-11: 50 ug/min via INTRAVENOUS

## 2020-05-11 MED ORDER — CEFAZOLIN SODIUM-DEXTROSE 2-4 GM/100ML-% IV SOLN
2.0000 g | INTRAVENOUS | Status: AC
  Administered 2020-05-11: 2 g via INTRAVENOUS
  Filled 2020-05-11: qty 100

## 2020-05-11 MED ORDER — ONDANSETRON HCL 4 MG PO TABS: 4.0000 mg | ORAL_TABLET | Freq: Four times a day (QID) | ORAL | Status: DC | PRN

## 2020-05-11 MED ORDER — ONDANSETRON HCL 4 MG/2ML IJ SOLN
INTRAMUSCULAR | Status: DC | PRN
  Administered 2020-05-11: 4 mg via INTRAVENOUS

## 2020-05-11 MED ORDER — 0.9 % SODIUM CHLORIDE (POUR BTL) OPTIME
TOPICAL | Status: DC | PRN
  Administered 2020-05-11: 1000 mL

## 2020-05-11 MED ORDER — DOCUSATE SODIUM 100 MG PO CAPS
100.0000 mg | ORAL_CAPSULE | Freq: Two times a day (BID) | ORAL | Status: DC
  Administered 2020-05-11 – 2020-05-12 (×3): 100 mg via ORAL
  Filled 2020-05-11 (×3): qty 1

## 2020-05-11 MED ORDER — DEXAMETHASONE SODIUM PHOSPHATE 10 MG/ML IJ SOLN
8.0000 mg | Freq: Once | INTRAMUSCULAR | Status: AC
  Administered 2020-05-11: 8 mg via INTRAVENOUS

## 2020-05-11 MED ORDER — SODIUM CHLORIDE 0.9 % IR SOLN
Status: DC | PRN
  Administered 2020-05-11: 1000 mL

## 2020-05-11 MED ORDER — HYDROMORPHONE HCL 1 MG/ML IJ SOLN
INTRAMUSCULAR | Status: AC
  Filled 2020-05-11: qty 2

## 2020-05-11 MED ORDER — MIDAZOLAM HCL 5 MG/5ML IJ SOLN
INTRAMUSCULAR | Status: DC | PRN
  Administered 2020-05-11: 1 mg via INTRAVENOUS

## 2020-05-11 MED ORDER — HYDROMORPHONE HCL 1 MG/ML IJ SOLN
0.2500 mg | INTRAMUSCULAR | Status: DC | PRN
  Administered 2020-05-11 (×4): 0.5 mg via INTRAVENOUS

## 2020-05-11 MED ORDER — METOCLOPRAMIDE HCL 5 MG PO TABS: 5.0000 mg | ORAL_TABLET | Freq: Three times a day (TID) | ORAL | Status: DC | PRN

## 2020-05-11 MED ORDER — SODIUM CHLORIDE 0.9 % IV SOLN: INTRAVENOUS | Status: DC

## 2020-05-11 MED ORDER — FENTANYL CITRATE (PF) 100 MCG/2ML IJ SOLN
INTRAMUSCULAR | Status: DC | PRN
  Administered 2020-05-11 (×2): 50 ug via INTRAVENOUS
  Administered 2020-05-11: 25 ug via INTRAVENOUS
  Administered 2020-05-11: 50 ug via INTRAVENOUS
  Administered 2020-05-11: 25 ug via INTRAVENOUS

## 2020-05-11 MED ORDER — LIDOCAINE HCL (CARDIAC) PF 100 MG/5ML IV SOSY
PREFILLED_SYRINGE | INTRAVENOUS | Status: DC | PRN
  Administered 2020-05-11: 60 mg via INTRAVENOUS

## 2020-05-11 MED ORDER — ALBUTEROL SULFATE HFA 108 (90 BASE) MCG/ACT IN AERS: 1.0000 | INHALATION_SPRAY | Freq: Four times a day (QID) | RESPIRATORY_TRACT | Status: DC | PRN

## 2020-05-11 MED ORDER — LACTATED RINGERS IV SOLN: INTRAVENOUS | Status: DC

## 2020-05-11 MED ORDER — PROPOFOL 500 MG/50ML IV EMUL
INTRAVENOUS | Status: DC | PRN
  Administered 2020-05-11: 200 ug/kg/min via INTRAVENOUS

## 2020-05-11 MED ORDER — DEXAMETHASONE SODIUM PHOSPHATE 10 MG/ML IJ SOLN
INTRAMUSCULAR | Status: AC
  Filled 2020-05-11: qty 1

## 2020-05-11 MED ORDER — KETOROLAC TROMETHAMINE 30 MG/ML IJ SOLN: 30.0000 mg | Freq: Once | INTRAMUSCULAR | Status: DC | PRN

## 2020-05-11 MED ORDER — MIDAZOLAM HCL 2 MG/2ML IJ SOLN
INTRAMUSCULAR | Status: AC
  Filled 2020-05-11: qty 2

## 2020-05-11 MED ORDER — PROMETHAZINE HCL 25 MG/ML IJ SOLN: 6.2500 mg | INTRAMUSCULAR | Status: DC | PRN

## 2020-05-11 MED ORDER — OXYCODONE HCL 5 MG PO TABS
5.0000 mg | ORAL_TABLET | ORAL | Status: DC | PRN
  Administered 2020-05-11 – 2020-05-12 (×5): 10 mg via ORAL
  Filled 2020-05-11 (×5): qty 2

## 2020-05-11 MED ORDER — METOCLOPRAMIDE HCL 5 MG/ML IJ SOLN: 5.0000 mg | Freq: Three times a day (TID) | INTRAMUSCULAR | Status: DC | PRN

## 2020-05-11 MED ORDER — KETAMINE HCL 10 MG/ML IJ SOLN
INTRAMUSCULAR | Status: AC
  Filled 2020-05-11: qty 1

## 2020-05-11 MED ORDER — GABAPENTIN 300 MG PO CAPS
300.0000 mg | ORAL_CAPSULE | Freq: Three times a day (TID) | ORAL | Status: DC
  Administered 2020-05-11 – 2020-05-12 (×3): 300 mg via ORAL
  Filled 2020-05-11 (×3): qty 1

## 2020-05-11 MED ORDER — METHOCARBAMOL 500 MG PO TABS
500.0000 mg | ORAL_TABLET | Freq: Four times a day (QID) | ORAL | Status: DC | PRN
  Administered 2020-05-12: 500 mg via ORAL
  Filled 2020-05-11: qty 1

## 2020-05-11 MED ORDER — MORPHINE SULFATE (PF) 2 MG/ML IV SOLN
0.5000 mg | INTRAVENOUS | Status: DC | PRN
  Administered 2020-05-11: 1 mg via INTRAVENOUS
  Filled 2020-05-11: qty 1

## 2020-05-11 MED ORDER — SODIUM CHLORIDE (PF) 0.9 % IJ SOLN
INTRAMUSCULAR | Status: DC | PRN
  Administered 2020-05-11: 60 mL

## 2020-05-11 MED ORDER — PHENYLEPHRINE HCL-NACL 10-0.9 MG/250ML-% IV SOLN
INTRAVENOUS | Status: AC
  Filled 2020-05-11: qty 250

## 2020-05-11 MED ORDER — ONDANSETRON HCL 4 MG/2ML IJ SOLN
INTRAMUSCULAR | Status: AC
  Filled 2020-05-11: qty 2

## 2020-05-11 MED ORDER — ACETAMINOPHEN 500 MG PO TABS: 1000.0000 mg | ORAL_TABLET | Freq: Once | ORAL | Status: AC

## 2020-05-11 MED ORDER — TRANEXAMIC ACID 1000 MG/10ML IV SOLN
INTRAVENOUS | Status: DC | PRN
  Administered 2020-05-11: 2000 mg via TOPICAL

## 2020-05-11 MED ORDER — POLYETHYLENE GLYCOL 3350 17 G PO PACK: 17.0000 g | PACK | Freq: Every day | ORAL | Status: DC | PRN

## 2020-05-11 MED ORDER — CEFAZOLIN SODIUM-DEXTROSE 2-4 GM/100ML-% IV SOLN
2.0000 g | Freq: Four times a day (QID) | INTRAVENOUS | Status: AC
  Administered 2020-05-11 (×2): 2 g via INTRAVENOUS
  Filled 2020-05-11 (×2): qty 100

## 2020-05-11 MED ORDER — DEXAMETHASONE SODIUM PHOSPHATE 10 MG/ML IJ SOLN
10.0000 mg | Freq: Once | INTRAMUSCULAR | Status: AC
  Administered 2020-05-12: 10 mg via INTRAVENOUS
  Filled 2020-05-11: qty 1

## 2020-05-11 MED ORDER — ACETAMINOPHEN 500 MG PO TABS: 1000.0000 mg | ORAL_TABLET | Freq: Once | ORAL | Status: DC

## 2020-05-11 MED ORDER — OXYCODONE HCL 5 MG/5ML PO SOLN: 5.0000 mg | Freq: Once | ORAL | Status: DC | PRN

## 2020-05-11 MED ORDER — FENTANYL CITRATE (PF) 100 MCG/2ML IJ SOLN
50.0000 ug | INTRAMUSCULAR | Status: DC
  Administered 2020-05-11: 50 ug via INTRAVENOUS
  Filled 2020-05-11: qty 2

## 2020-05-11 MED ORDER — BUPIVACAINE LIPOSOME 1.3 % IJ SUSP
INTRAMUSCULAR | Status: DC | PRN
  Administered 2020-05-11: 20 mL

## 2020-05-11 MED ORDER — SODIUM CHLORIDE (PF) 0.9 % IJ SOLN
INTRAMUSCULAR | Status: AC
  Filled 2020-05-11: qty 10

## 2020-05-11 MED ORDER — FENTANYL CITRATE (PF) 100 MCG/2ML IJ SOLN
INTRAMUSCULAR | Status: AC
  Filled 2020-05-11: qty 2

## 2020-05-11 MED ORDER — ACETAMINOPHEN 10 MG/ML IV SOLN
1000.0000 mg | Freq: Once | INTRAVENOUS | Status: AC
  Administered 2020-05-11: 1000 mg via INTRAVENOUS
  Filled 2020-05-11: qty 100

## 2020-05-11 MED ORDER — DEXAMETHASONE SODIUM PHOSPHATE 10 MG/ML IJ SOLN
INTRAMUSCULAR | Status: DC | PRN
  Administered 2020-05-11: 10 mg via INTRAVENOUS

## 2020-05-11 MED ORDER — OXYCODONE HCL 5 MG PO TABS: 5.0000 mg | ORAL_TABLET | Freq: Once | ORAL | Status: DC | PRN

## 2020-05-11 MED ORDER — PHENOL 1.4 % MT LIQD: 1.0000 | OROMUCOSAL | Status: DC | PRN

## 2020-05-11 MED ORDER — CHLORHEXIDINE GLUCONATE 0.12 % MT SOLN
15.0000 mL | Freq: Once | OROMUCOSAL | Status: AC
  Administered 2020-05-11: 15 mL via OROMUCOSAL

## 2020-05-11 MED ORDER — ORAL CARE MOUTH RINSE: 15.0000 mL | Freq: Once | OROMUCOSAL | Status: AC

## 2020-05-11 MED ORDER — DIPHENHYDRAMINE HCL 12.5 MG/5ML PO ELIX: 12.5000 mg | ORAL_SOLUTION | ORAL | Status: DC | PRN

## 2020-05-11 MED ORDER — ACETAMINOPHEN 500 MG PO TABS
1000.0000 mg | ORAL_TABLET | Freq: Four times a day (QID) | ORAL | Status: AC
  Administered 2020-05-11 – 2020-05-12 (×4): 1000 mg via ORAL
  Filled 2020-05-11 (×4): qty 2

## 2020-05-11 MED ORDER — ONDANSETRON HCL 4 MG/2ML IJ SOLN: 4.0000 mg | Freq: Four times a day (QID) | INTRAMUSCULAR | Status: DC | PRN

## 2020-05-11 MED ORDER — FLEET ENEMA 7-19 GM/118ML RE ENEM: 1.0000 | ENEMA | Freq: Once | RECTAL | Status: DC | PRN

## 2020-05-11 MED ORDER — MEPERIDINE HCL 50 MG/ML IJ SOLN: 6.2500 mg | INTRAMUSCULAR | Status: DC | PRN

## 2020-05-11 MED ORDER — STERILE WATER FOR IRRIGATION IR SOLN
Status: DC | PRN
  Administered 2020-05-11: 2000 mL

## 2020-05-11 MED ORDER — METHOCARBAMOL 500 MG IVPB - SIMPLE MED
500.0000 mg | Freq: Four times a day (QID) | INTRAVENOUS | Status: DC | PRN
  Administered 2020-05-11: 500 mg via INTRAVENOUS
  Filled 2020-05-11: qty 50

## 2020-05-11 MED ORDER — PROPOFOL 10 MG/ML IV BOLUS
INTRAVENOUS | Status: AC
  Filled 2020-05-11: qty 20

## 2020-05-11 MED ORDER — KETOROLAC TROMETHAMINE 30 MG/ML IJ SOLN
INTRAMUSCULAR | Status: AC
  Filled 2020-05-11: qty 1

## 2020-05-11 MED ORDER — METHOCARBAMOL 500 MG IVPB - SIMPLE MED
INTRAVENOUS | Status: AC
  Filled 2020-05-11: qty 50

## 2020-05-11 MED ORDER — BISACODYL 10 MG RE SUPP: 10.0000 mg | Freq: Every day | RECTAL | Status: DC | PRN

## 2020-05-11 MED ORDER — TRAMADOL HCL 50 MG PO TABS: 50.0000 mg | ORAL_TABLET | Freq: Four times a day (QID) | ORAL | Status: DC | PRN

## 2020-05-11 MED ORDER — MIDAZOLAM HCL 2 MG/2ML IJ SOLN
1.0000 mg | INTRAMUSCULAR | Status: DC
  Administered 2020-05-11: 2 mg via INTRAVENOUS
  Filled 2020-05-11: qty 2

## 2020-05-11 SURGICAL SUPPLY — 54 items
ATTUNE MED DOME PAT 38 KNEE (Knees) ×2 IMPLANT
ATTUNE PS FEM RT SZ 6 CEM KNEE (Femur) ×2 IMPLANT
ATTUNE PSRP INSR SZ6 8 KNEE (Insert) ×2 IMPLANT
BAG ZIPLOCK 12X15 (MISCELLANEOUS) ×2 IMPLANT
BASE TIBIAL ROT PLAT SZ 5 KNEE (Knees) ×1 IMPLANT
BLADE SAG 18X100X1.27 (BLADE) ×2 IMPLANT
BLADE SAW SGTL 11.0X1.19X90.0M (BLADE) ×2 IMPLANT
BNDG ELASTIC 6X5.8 VLCR STR LF (GAUZE/BANDAGES/DRESSINGS) ×2 IMPLANT
BOWL SMART MIX CTS (DISPOSABLE) ×2 IMPLANT
CEMENT HV SMART SET (Cement) ×4 IMPLANT
COVER SURGICAL LIGHT HANDLE (MISCELLANEOUS) ×2 IMPLANT
COVER WAND RF STERILE (DRAPES) IMPLANT
CUFF TOURN SGL QUICK 34 (TOURNIQUET CUFF) ×2
CUFF TRNQT CYL 34X4.125X (TOURNIQUET CUFF) ×1 IMPLANT
DECANTER SPIKE VIAL GLASS SM (MISCELLANEOUS) ×2 IMPLANT
DRAPE U-SHAPE 47X51 STRL (DRAPES) ×2 IMPLANT
DRESSING AQUACEL AG SP 3.5X10 (GAUZE/BANDAGES/DRESSINGS) ×1 IMPLANT
DRSG AQUACEL AG ADV 3.5X10 (GAUZE/BANDAGES/DRESSINGS) ×2 IMPLANT
DRSG AQUACEL AG SP 3.5X10 (GAUZE/BANDAGES/DRESSINGS) ×2
DURAPREP 26ML APPLICATOR (WOUND CARE) ×2 IMPLANT
ELECT REM PT RETURN 15FT ADLT (MISCELLANEOUS) ×2 IMPLANT
GLOVE SRG 8 PF TXTR STRL LF DI (GLOVE) ×1 IMPLANT
GLOVE SURG ENC MOIS LTX SZ6 (GLOVE) IMPLANT
GLOVE SURG ENC MOIS LTX SZ7 (GLOVE) ×2 IMPLANT
GLOVE SURG ENC MOIS LTX SZ8 (GLOVE) ×2 IMPLANT
GLOVE SURG UNDER POLY LF SZ6.5 (GLOVE) ×2 IMPLANT
GLOVE SURG UNDER POLY LF SZ8 (GLOVE) ×2
GLOVE SURG UNDER POLY LF SZ8.5 (GLOVE) ×2 IMPLANT
GOWN STRL REUS W/TWL LRG LVL3 (GOWN DISPOSABLE) ×4 IMPLANT
GOWN STRL REUS W/TWL XL LVL3 (GOWN DISPOSABLE) ×2 IMPLANT
HANDPIECE INTERPULSE COAX TIP (DISPOSABLE) ×2
HOLDER FOLEY CATH W/STRAP (MISCELLANEOUS) IMPLANT
IMMOBILIZER KNEE 20 (SOFTGOODS) ×2
IMMOBILIZER KNEE 20 THIGH 36 (SOFTGOODS) ×1 IMPLANT
KIT TURNOVER KIT A (KITS) ×2 IMPLANT
MANIFOLD NEPTUNE II (INSTRUMENTS) ×2 IMPLANT
NS IRRIG 1000ML POUR BTL (IV SOLUTION) ×2 IMPLANT
PACK TOTAL KNEE CUSTOM (KITS) ×2 IMPLANT
PADDING CAST COTTON 6X4 STRL (CAST SUPPLIES) ×2 IMPLANT
PENCIL SMOKE EVACUATOR (MISCELLANEOUS) ×2 IMPLANT
PIN DRILL FIX HALF THREAD (BIT) ×2 IMPLANT
PIN STEINMAN FIXATION KNEE (PIN) ×2 IMPLANT
PROTECTOR NERVE ULNAR (MISCELLANEOUS) ×2 IMPLANT
SET HNDPC FAN SPRY TIP SCT (DISPOSABLE) ×1 IMPLANT
STRIP CLOSURE SKIN 1/2X4 (GAUZE/BANDAGES/DRESSINGS) ×4 IMPLANT
SUT MNCRL AB 4-0 PS2 18 (SUTURE) ×2 IMPLANT
SUT STRATAFIX 0 PDS 27 VIOLET (SUTURE) ×2
SUT VIC AB 2-0 CT1 27 (SUTURE) ×6
SUT VIC AB 2-0 CT1 TAPERPNT 27 (SUTURE) ×3 IMPLANT
SUTURE STRATFX 0 PDS 27 VIOLET (SUTURE) ×1 IMPLANT
TIBIAL BASE ROT PLAT SZ 5 KNEE (Knees) ×2 IMPLANT
TRAY FOLEY MTR SLVR 16FR STAT (SET/KITS/TRAYS/PACK) IMPLANT
WATER STERILE IRR 1000ML POUR (IV SOLUTION) ×4 IMPLANT
WRAP KNEE MAXI GEL POST OP (GAUZE/BANDAGES/DRESSINGS) ×2 IMPLANT

## 2020-05-11 NOTE — Anesthesia Procedure Notes (Signed)
Procedure Name: LMA Insertion Date/Time: 05/11/2020 8:17 AM Performed by: Garth Bigness, CRNA Pre-anesthesia Checklist: Patient identified, Emergency Drugs available, Suction available and Patient being monitored Patient Re-evaluated:Patient Re-evaluated prior to induction Oxygen Delivery Method: Circle system utilized Preoxygenation: Pre-oxygenation with 100% oxygen Induction Type: IV induction Ventilation: Mask ventilation without difficulty LMA Size: 4.0 Number of attempts: 1 Placement Confirmation: positive ETCO2 Tube secured with: Tape Dental Injury: Teeth and Oropharynx as per pre-operative assessment

## 2020-05-11 NOTE — Evaluation (Signed)
Physical Therapy Evaluation Patient Details Name: Laura [name] MRN: [id number] DOB: November 28, 1955 Today's Date: 05/11/2020   History of Present Illness  Patient is a 65 y.o. female s/p Rt TKA on 05/11/2020 with PMH significant for pulomnary embolus, pre-diabetes, obesity, HTN, asthma, PA, anemia, and Lt TKA (2018).    Clinical Impression  Lyndsay Talamante is a 65 y.o. female POD 0 s/p Rt TKA. Patient reports independence with mobility at baseline. Patient is now limited by functional impairments (see PT problem list below) and requires min assist for bed mobility and transfers with RW. Patient was limited by pain this session and short episode of dizziness that resolved. She was able to take several small steps with RW and min assist as well as blocking at Rt knee to move bed>chair. Patient instructed in exercise to facilitate circulation to manage edema and reduce risk of DVT. Patient will benefit from continued skilled PT interventions to address impairments and progress towards PLOF. Acute PT will follow to progress mobility and stair training in preparation for safe discharge home.     Follow Up Recommendations Follow surgeon's recommendation for DC plan and follow-up therapies;Home health PT    Equipment Recommendations  None recommended by PT    Recommendations for Other Services       Precautions / Restrictions Precautions Precautions: Fall Restrictions Weight Bearing Restrictions: No Other Position/Activity Restrictions: WBAT      Mobility  Bed Mobility Overal bed mobility: Needs Assistance Bed Mobility: Supine to Sit     Supine to sit: Min assist;HOB elevated     General bed mobility comments: cues to use bed rail, assist to bring LE's off EOB. Pt c/o dizziness first time and returned to supine with min assist for bil LE's. symptoms resolved quickly. Pt sat up EOB 2nd time and no symptoms. BP stable at 121/75.    Transfers Overall transfer level: Needs assistance Equipment  used: Rolling walker (2 wheeled) Transfers: Sit to/from [employer] Sit to Stand: Min assist;From elevated surface Stand pivot transfers: Min assist       General transfer comment: Cues or technique/hand placement on RW. pt required min assist for power up. Pt able to take several small side steps and required min assist for walker position and guarding/blocking at Rt knee to prevent buckling to move bed>chair.  Ambulation/Gait                Stairs            Wheelchair Mobility    Modified Rankin (Stroke Patients Only)       Balance Overall balance assessment: Needs assistance Sitting-balance support: Feet supported Sitting balance-Leahy Scale: Good     Standing balance support: During functional activity;Bilateral upper extremity supported Standing balance-Leahy Scale: Poor                               Pertinent Vitals/Pain Pain Assessment: 0-10 Pain Score: 5  Pain Location: Rt Knee Pain Descriptors / Indicators: Aching;Discomfort Pain Intervention(s): Limited activity within patient's tolerance;Monitored during session;Repositioned;Ice applied    Home Living Family/patient expects to be discharged to:: Private residence Living Arrangements: Spouse/significant other Available Help at Discharge: Family;Friend(s) Type of Home: House Home Access: Stairs to enter Entrance Stairs-Rails: None Entrance Stairs-Number of Steps: 2 Home Layout: Two level;Able to live on main level with bedroom/bathroom Home Equipment: Dan Humphreys - 2 wheels;Cane - single point;Bedside commode Additional Comments: main level    Prior Function  Level of Independence: Independent               Hand Dominance   Dominant Hand: Right    Extremity/Trunk Assessment   Upper Extremity Assessment Upper Extremity Assessment: Overall WFL for tasks assessed    Lower Extremity Assessment Lower Extremity Assessment: RLE deficits/detail RLE Deficits /  Details: good quad activation, unable to complete SLR due to pain, 4/5 for dorsi/plantar flexion RLE: Unable to fully assess due to pain RLE Sensation: WNL RLE Coordination: WNL    Cervical / Trunk Assessment Cervical / Trunk Assessment: Normal  Communication   Communication: No difficulties  Cognition Arousal/Alertness: Awake/alert Behavior During Therapy: WFL for tasks assessed/performed Overall Cognitive Status: Within Functional Limits for tasks assessed                                        General Comments      Exercises Total Joint Exercises Ankle Circles/Pumps: AROM;Both;20 reps;Seated   Assessment/Plan    PT Assessment Patient needs continued PT services  PT Problem List Decreased strength;Decreased range of motion;Decreased activity tolerance;Decreased balance;Decreased mobility;Decreased knowledge of use of DME;Decreased knowledge of precautions;Pain       PT Treatment Interventions DME instruction;Gait training;Stair training;Functional mobility training;Therapeutic activities;Therapeutic exercise;Balance training;Patient/family education    PT Goals (Current goals can be found in the Care Plan section)  Acute Rehab PT Goals Patient Stated Goal: get to her daughters graduation in ~6 weeks PT Goal Formulation: With patient Time For Goal Achievement: 05/18/20 Potential to Achieve Goals: Good    Frequency 7X/week   Barriers to discharge        Co-evaluation               AM-PAC PT "6 Clicks" Mobility  Outcome Measure Help needed turning from your back to your side while in a flat bed without using bedrails?: A Little Help needed moving from lying on your back to sitting on the side of a flat bed without using bedrails?: A Little Help needed moving to and from a bed to a chair (including a wheelchair)?: A Little Help needed standing up from a chair using your arms (e.g., wheelchair or bedside chair)?: A Little Help needed to walk in  hospital room?: A Lot Help needed climbing 3-5 steps with a railing? : A Lot 6 Click Score: 16    End of Session         PT Visit Diagnosis: Muscle weakness (generalized) (M62.81);Difficulty in walking, not elsewhere classified (R26.2);Pain Pain - Right/Left: Right Pain - part of body: Knee    Time: 1751-0258 PT Time Calculation (min) (ACUTE ONLY): 35 min   Charges:   PT Evaluation $PT Eval Low Complexity: 1 Low PT Treatments $Therapeutic Activity: 8-22 mins        Wynn Maudlin, DPT Acute Rehabilitation Services Office [phone] Pager [phone]    Anitra Lauth 05/11/2020, 4:20 PM

## 2020-05-11 NOTE — Transfer of Care (Signed)
Immediate Anesthesia Transfer of Care Note  Patient: Laura [name]  Procedure(s) Performed: TOTAL KNEE ARTHROPLASTY (Right Knee)  Patient Location: PACU  Anesthesia Type:General  Level of Consciousness: awake, alert , oriented and patient cooperative  Airway & Oxygen Therapy: Patient Spontanous Breathing and Patient connected to face mask oxygen  Post-op Assessment: Report given to RN and Post -op Vital signs reviewed and stable  Post vital signs: Reviewed and stable  Last Vitals:  Vitals Value Taken Time  BP 135/78 05/11/20 0953  Temp    Pulse 70 05/11/20 0956  Resp 20 05/11/20 0956  SpO2 100 % 05/11/20 0956  Vitals shown include unvalidated device data.  Last Pain:  Vitals:   05/11/20 0647  TempSrc:   PainSc: 0-No pain      Patients Stated Pain Goal: 4 (05/11/20 1093)  Complications: No complications documented.

## 2020-05-11 NOTE — Interval H&P Note (Signed)
History and Physical Interval Note:  05/11/2020 6:30 AM  Laura [name]  has presented today for surgery, with the diagnosis of right knee osteoarthritis.  The various methods of treatment have been discussed with the patient and family. After consideration of risks, benefits and other options for treatment, the patient has consented to  Procedure(s) with comments: TOTAL KNEE ARTHROPLASTY (Right) - as a surgical intervention.  The patient's history has been reviewed, patient examined, no change in status, stable for surgery.  I have reviewed the patient's chart and labs.  Questions were answered to the patient's satisfaction.     [name]

## 2020-05-11 NOTE — Op Note (Signed)
OPERATIVE REPORT-TOTAL KNEE ARTHROPLASTY   Pre-operative diagnosis- Osteoarthritis  Right knee(s)  Post-operative diagnosis- Osteoarthritis Right knee(s)  Procedure-  Right  Total Knee Arthroplasty  Surgeon- Gus Rankin. Aluisio, MD  Assistant- Arther Abbott, PA-C   Anesthesia-  Adductor canal block and spinal  EBL- 25 ml   Drains None  Tourniquet time-  Total Tourniquet Time Documented: Thigh (Right) - 42 minutes Total: Thigh (Right) - 42 minutes     Complications- None  Condition-PACU - hemodynamically stable.   Brief Clinical Note  Laura [name] is a 65 y.o. year old female with end stage OA of her right knee with progressively worsening pain and dysfunction. She has constant pain, with activity and at rest and significant functional deficits with difficulties even with ADLs. She has had extensive non-op management including analgesics, injections of cortisone and viscosupplements, and home exercise program, but remains in significant pain with significant dysfunction.Radiographs show bone on bone arthritis medial and patellofemoral. She presents now for right Total Knee Arthroplasty.    Procedure in detail---   The patient is brought into the operating room and positioned supine on the operating table. After successful administration of  Adductor canal block and spinal,   a tourniquet is placed high on the  Right thigh(s) and the lower extremity is prepped and draped in the usual sterile fashion. Time out is performed by the operating team and then the  Right lower extremity is wrapped in Esmarch, knee flexed and the tourniquet inflated to 300 mmHg.       A midline incision is made with a ten blade through the subcutaneous tissue to the level of the extensor mechanism. A fresh blade is used to make a medial parapatellar arthrotomy. Soft tissue over the proximal medial tibia is subperiosteally elevated to the joint line with a knife and into the semimembranosus bursa with a  Cobb elevator. Soft tissue over the proximal lateral tibia is elevated with attention being paid to avoiding the patellar tendon on the tibial tubercle. The patella is everted, knee flexed 90 degrees and the ACL and PCL are removed. Findings are bone on bone medial and patellofemoral with large global osteophytes.        The drill is used to create a starting hole in the distal femur and the canal is thoroughly irrigated with sterile saline to remove the fatty contents. The 5 degree Right  valgus alignment guide is placed into the femoral canal and the distal femoral cutting block is pinned to remove 9 mm off the distal femur. Resection is made with an oscillating saw.      The tibia is subluxed forward and the menisci are removed. The extramedullary alignment guide is placed referencing proximally at the medial aspect of the tibial tubercle and distally along the second metatarsal axis and tibial crest. The block is pinned to remove 54mm off the more deficient medial  side. Resection is made with an oscillating saw. Size 5is the most appropriate size for the tibia and the proximal tibia is prepared with the modular drill and keel punch for that size.      The femoral sizing guide is placed and size 6 is most appropriate. Rotation is marked off the epicondylar axis and confirmed by creating a rectangular flexion gap at 90 degrees. The size 6 cutting block is pinned in this rotation and the anterior, posterior and chamfer cuts are made with the oscillating saw. The intercondylar block is then placed and that cut is made.  Trial size 5 tibial component, trial size 6 posterior stabilized femur and a 8  mm posterior stabilized rotating platform insert trial is placed. Full extension is achieved with excellent varus/valgus and anterior/posterior balance throughout full range of motion. The patella is everted and thickness measured to be 22  mm. Free hand resection is taken to 12 mm, a 38 template is placed, lug  holes are drilled, trial patella is placed, and it tracks normally. Osteophytes are removed off the posterior femur with the trial in place. All trials are removed and the cut bone surfaces prepared with pulsatile lavage. Cement is mixed and once ready for implantation, the size 5 tibial implant, size  6 posterior stabilized femoral component, and the size 38 patella are cemented in place and the patella is held with the clamp. The trial insert is placed and the knee held in full extension. The Exparel (20 ml mixed with 60 ml saline) is injected into the extensor mechanism, posterior capsule, medial and lateral gutters and subcutaneous tissues.  All extruded cement is removed and once the cement is hard the permanent 8 mm posterior stabilized rotating platform insert is placed into the tibial tray.      The wound is copiously irrigated with saline solution and the extensor mechanism closed with # 0 Stratofix suture. The tourniquet is released for a total tourniquet time of 42  minutes. Flexion against gravity is 130 degrees and the patella tracks normally. Subcutaneous tissue is closed with 2.0 vicryl and subcuticular with running 4.0 Monocryl. The incision is cleaned and dried and steri-strips and a bulky sterile dressing are applied. The limb is placed into a knee immobilizer and the patient is awakened and transported to recovery in stable condition.      Please note that a surgical assistant was a medical necessity for this procedure in order to perform it in a safe and expeditious manner. Surgical assistant was necessary to retract the ligaments and vital neurovascular structures to prevent injury to them and also necessary for proper positioning of the limb to allow for anatomic placement of the prosthesis.   Gus Rankin Aluisio, MD    05/11/2020, 9:22 AM

## 2020-05-11 NOTE — Anesthesia Procedure Notes (Signed)
Anesthesia Regional Block: Adductor canal block   Pre-Anesthetic Checklist: ,, timeout performed, Correct Patient, Correct Site, Correct Laterality, Correct Procedure, Correct Position, site marked, Risks and benefits discussed,  Surgical consent,  Pre-op evaluation,  At surgeon's request and post-op pain management  Laterality: Right  Prep: Maximum Sterile Barrier Precautions used, chloraprep       Needles:  Injection technique: Single-shot  Needle Type: Echogenic Stimulator Needle     Needle Length: 9cm  Needle Gauge: 22     Additional Needles:   Procedures:,,,, ultrasound used (permanent image in chart),,,,  Narrative:  Start time: 05/11/2020 7:50 AM End time: 05/11/2020 8:00 AM Injection made incrementally with aspirations every 5 mL.  Performed by: Personally  Anesthesiologist: Lannie Fields, DO  Additional Notes: Monitors applied. No increased pain on injection. No increased resistance to injection. Injection made in 5cc increments. Good needle visualization. Patient tolerated procedure well.

## 2020-05-11 NOTE — Progress Notes (Signed)
Orthopedic Tech Progress Note Patient Details:  Laura [name] Jun 07, 1955 [id number]  Ortho Devices Type of Ortho Device: CPM padding Ortho Device/Splint Location: off cpm Ortho Device/Splint Interventions: Ordered,Application   Post Interventions Patient Tolerated: Fair Instructions Provided: Adjustment of device,Care of device   Jennye Moccasin 05/11/2020, 1:54 PM

## 2020-05-11 NOTE — Progress Notes (Signed)
Orthopedic Tech Progress Note Patient Details:  Laura [name] 1956/02/22 [id number]  Patient ID: Laura [name], female   DOB: [birth year], 65 y.o.   MRN: [id number]   Laura [name] 05/11/2020, 10:17 AM cpm placed on patient in PACU @1015 

## 2020-05-11 NOTE — Discharge Instructions (Signed)
 Frank Aluisio, MD Total Joint Specialist [facility] [address] [place], [place] [address] [phone]  TOTAL KNEE REPLACEMENT POSTOPERATIVE DIRECTIONS    Knee Rehabilitation, Guidelines Following Surgery  Results after knee surgery are often greatly improved when you follow the exercise, range of motion and muscle strengthening exercises prescribed by your doctor. Safety measures are also important to protect the knee from further injury. If any of these exercises cause you to have increased pain or swelling in your knee joint, decrease the amount until you are comfortable again and slowly increase them. If you have problems or questions, call your caregiver or physical therapist for advice.   HOME CARE INSTRUCTIONS  . Remove items at home which could result in a fall. This includes throw rugs or furniture in walking pathways.  . ICE to the affected knee as much as tolerated. Icing helps control swelling. If the swelling is well controlled you will be more comfortable and rehab easier. Continue to use ice on the knee for pain and swelling from surgery. You may notice swelling that will progress down to the foot and ankle. This is normal after surgery. Elevate the leg when you are not up walking on it.    . Continue to use the breathing machine which will help keep your temperature down. It is common for your temperature to cycle up and down following surgery, especially at night when you are not up moving around and exerting yourself. The breathing machine keeps your lungs expanded and your temperature down. . Do not place pillow under the operative knee, focus on keeping the knee straight while resting  DIET You may resume your previous home diet once you are discharged from the hospital.  DRESSING / WOUND CARE / SHOWERING . Keep your bulky bandage on for 2 days. On the third post-operative day you may remove the Ace bandage and gauze. There is a  waterproof adhesive bandage on your skin which will stay in place until your first follow-up appointment. Once you remove this you will not need to place another bandage . You may begin showering 3 days following surgery, but do not submerge the incision under water.  ACTIVITY For the first 5 days, the key is rest and control of pain and swelling . Do your home exercises twice a day starting on post-operative day 3. On the days you go to physical therapy, just do the home exercises once that day. . You should rest, ice and elevate the leg for 50 minutes out of every hour. Get up and walk/stretch for 10 minutes per hour. After 5 days you can increase your activity slowly as tolerated. . Walk with your walker as instructed. Use the walker until you are comfortable transitioning to a cane. Walk with the cane in the opposite hand of the operative leg. You may discontinue the cane once you are comfortable and walking steadily. . Avoid periods of inactivity such as sitting longer than an hour when not asleep. This helps prevent blood clots.  . You may discontinue the knee immobilizer once you are able to perform a straight leg raise while lying down. . You may resume a sexual relationship in one month or when given the OK by your doctor.  . You may return to work once you are cleared by your doctor.  . Do not drive a car for 6 weeks or until released by your surgeon.  . Do not drive while taking narcotics.  TED HOSE   STOCKINGS Wear the elastic stockings on both legs for three weeks following surgery during the day. You may remove them at night for sleeping.  WEIGHT BEARING Weight bearing as tolerated with assist device (walker, cane, etc) as directed, use it as long as suggested by your surgeon or therapist, typically at least 4-6 weeks.  POSTOPERATIVE CONSTIPATION PROTOCOL Constipation - defined medically as fewer than three stools per week and severe constipation as less than one stool per  week.  One of the most common issues patients have following surgery is constipation.  Even if you have a regular bowel pattern at home, your normal regimen is likely to be disrupted due to multiple reasons following surgery.  Combination of anesthesia, postoperative narcotics, change in appetite and fluid intake all can affect your bowels.  In order to avoid complications following surgery, here are some recommendations in order to help you during your recovery period.  . Colace (docusate) - Pick up an over-the-counter form of Colace or another stool softener and take twice a day as long as you are requiring postoperative pain medications.  Take with a full glass of water daily.  If you experience loose stools or diarrhea, hold the colace until you stool forms back up. If your symptoms do not get better within 1 week or if they get worse, check with your doctor. . Dulcolax (bisacodyl) - Pick up over-the-counter and take as directed by the product packaging as needed to assist with the movement of your bowels.  Take with a full glass of water.  Use this product as needed if not relieved by Colace only.  . MiraLax (polyethylene glycol) - Pick up over-the-counter to have on hand. MiraLax is a solution that will increase the amount of water in your bowels to assist with bowel movements.  Take as directed and can mix with a glass of water, juice, soda, coffee, or tea. Take if you go more than two days without a movement. Do not use MiraLax more than once per day. Call your doctor if you are still constipated or irregular after using this medication for 7 days in a row.  If you continue to have problems with postoperative constipation, please contact the office for further assistance and recommendations.  If you experience "the worst abdominal pain ever" or develop nausea or vomiting, please contact the office immediatly for further recommendations for treatment.  ITCHING If you experience itching with your  medications, try taking only a single pain pill, or even half a pain pill at a time.  You can also use Benadryl over the counter for itching or also to help with sleep.   MEDICATIONS See your medication summary on the "After Visit Summary" that the nursing staff will review with you prior to discharge.  You may have some home medications which will be placed on hold until you complete the course of blood thinner medication.  It is important for you to complete the blood thinner medication as prescribed by your surgeon.  Continue your approved medications as instructed at time of discharge.  PRECAUTIONS . If you experience chest pain or shortness of breath - call 911 immediately for transfer to the hospital emergency department.  . If you develop a fever greater that 101 F, purulent drainage from wound, increased redness or drainage from wound, foul odor from the wound/dressing, or calf pain - CONTACT YOUR SURGEON.                                                     FOLLOW-UP APPOINTMENTS Make sure you keep all of your appointments after your operation with your surgeon and caregivers. You should call the office at the above phone number and make an appointment for approximately two weeks after the date of your surgery or on the date instructed by your surgeon outlined in the "After Visit Summary".  RANGE OF MOTION AND STRENGTHENING EXERCISES  Rehabilitation of the knee is important following a knee injury or an operation. After just a few days of immobilization, the muscles of the thigh which control the knee become weakened and shrink (atrophy). Knee exercises are designed to build up the tone and strength of the thigh muscles and to improve knee motion. Often times heat used for twenty to thirty minutes before working out will loosen up your tissues and help with improving the range of motion but do not use heat for the first two weeks following surgery. These exercises can be done on a training  (exercise) mat, on the floor, on a table or on a bed. Use what ever works the best and is most comfortable for you Knee exercises include:  . Leg Lifts - While your knee is still immobilized in a splint or cast, you can do straight leg raises. Lift the leg to 60 degrees, hold for 3 sec, and slowly lower the leg. Repeat 10-20 times 2-3 times daily. Perform this exercise against resistance later as your knee gets better.  . Quad and Hamstring Sets - Tighten up the muscle on the front of the thigh (Quad) and hold for 5-10 sec. Repeat this 10-20 times hourly. Hamstring sets are done by pushing the foot backward against an object and holding for 5-10 sec. Repeat as with quad sets.   Leg Slides: Lying on your back, slowly slide your foot toward your buttocks, bending your knee up off the floor (only go as far as is comfortable). Then slowly slide your foot back down until your leg is flat on the floor again.  Angel Wings: Lying on your back spread your legs to the side as far apart as you can without causing discomfort.  A rehabilitation program following serious knee injuries can speed recovery and prevent re-injury in the future due to weakened muscles. Contact your doctor or a physical therapist for more information on knee rehabilitation.   IF YOU ARE TRANSFERRED TO A SKILLED REHAB FACILITY If the patient is transferred to a skilled rehab facility following release from the hospital, a list of the current medications will be sent to the facility for the patient to continue.  When discharged from the skilled rehab facility, please have the facility set up the patient's Home Health Physical Therapy prior to being released. Also, the skilled facility will be responsible for providing the patient with their medications at time of release from the facility to include their pain medication, the muscle relaxants, and their blood thinner medication. If the patient is still at the rehab facility at time of the two  week follow up appointment, the skilled rehab facility will also need to assist the patient in arranging follow up appointment in our office and any transportation needs.  MAKE SURE YOU:  . Understand these instructions.  . Get help right away if you are not doing well or get worse.   DENTAL ANTIBIOTICS:  In most cases prophylactic antibiotics for Dental procdeures after total joint surgery are not necessary.  Exceptions are as follows:  1. History of prior total joint infection  2. Severely immunocompromised (  Organ Transplant, cancer chemotherapy, Rheumatoid biologic meds such as Humera)  3. Poorly controlled diabetes (A1C &gt; 8.0, blood glucose over 200)  If you have one of these conditions, contact your surgeon for an antibiotic prescription, prior to your dental procedure.    Pick up stool softner and laxative for home use following surgery while on pain medications. Do not submerge incision under water. Please use good hand washing techniques while changing dressing each day. May shower starting three days after surgery. Please use a clean towel to pat the incision dry following showers. Continue to use ice for pain and swelling after surgery. Do not use any lotions or creams on the incision until instructed by your surgeon.  

## 2020-05-11 NOTE — Anesthesia Postprocedure Evaluation (Signed)
Anesthesia Post Note  Patient: Laura [name]  Procedure(s) Performed: TOTAL KNEE ARTHROPLASTY (Right Knee)     Patient location during evaluation: PACU Anesthesia Type: Regional and General Level of consciousness: awake and alert, oriented and patient cooperative Pain management: pain level controlled Vital Signs Assessment: post-procedure vital signs reviewed and stable Respiratory status: spontaneous breathing, nonlabored ventilation and respiratory function stable Cardiovascular status: blood pressure returned to baseline and stable Postop Assessment: no apparent nausea or vomiting Anesthetic complications: no   No complications documented.  Last Vitals:  Vitals:   05/11/20 1045 05/11/20 1108  BP: 138/77 131/77  Pulse: 72 (!) 58  Resp: (!) 9   Temp: 36.7 C 36.4 C  SpO2: 100% 100%    Last Pain:  Vitals:   05/11/20 1045  TempSrc:   PainSc: Asleep                 Lannie Fields

## 2020-05-11 NOTE — Progress Notes (Signed)
AssistedDr. Beth Finucane with right, ultrasound guided, adductor canal block. Side rails up, monitors on throughout procedure. See vital signs in flow sheet. Tolerated Procedure well.  

## 2020-05-12 ENCOUNTER — Encounter (HOSPITAL_COMMUNITY): Payer: Self-pay | Admitting: Orthopedic Surgery

## 2020-05-12 DIAGNOSIS — M1711 Unilateral primary osteoarthritis, right knee: Secondary | ICD-10-CM | POA: Diagnosis not present

## 2020-05-12 LAB — BASIC METABOLIC PANEL
Anion gap: 7 (ref 5–15)
BUN: 18 mg/dL (ref 8–23)
CO2: 28 mmol/L (ref 22–32)
Calcium: 8.8 mg/dL — ABNORMAL LOW (ref 8.9–10.3)
Chloride: 99 mmol/L (ref 98–111)
Creatinine, Ser: 0.89 mg/dL (ref 0.44–1.00)
GFR, Estimated: >60 mL/min (ref >60)
Glucose, Bld: 139 mg/dL — ABNORMAL HIGH (ref 70–99)
Potassium: 4.5 mmol/L (ref 3.5–5.1)
Sodium: 134 mmol/L — ABNORMAL LOW (ref 135–145)

## 2020-05-12 LAB — CBC
HCT: 37.1 % (ref 36.0–46.0)
Hemoglobin: 11.5 g/dL — ABNORMAL LOW (ref 12.0–15.0)
MCH: 29.5 pg (ref 26.0–34.0)
MCHC: 31 g/dL (ref 30.0–36.0)
MCV: 95.1 fL (ref 80.0–100.0)
Platelets: 305 10*3/uL (ref 150–400)
RBC: 3.9 MIL/uL (ref 3.87–5.11)
RDW: 14.3 % (ref 11.5–15.5)
WBC: 12.4 10*3/uL — ABNORMAL HIGH (ref 4.0–10.5)
nRBC: 0 % (ref 0.0–0.2)

## 2020-05-12 MED ORDER — TRAMADOL HCL 50 MG PO TABS: 50.0000 mg | ORAL_TABLET | Freq: Four times a day (QID) | ORAL | 0 refills | Status: DC | PRN

## 2020-05-12 MED ORDER — ONDANSETRON HCL 4 MG PO TABS: 4.0000 mg | ORAL_TABLET | Freq: Four times a day (QID) | ORAL | 0 refills | Status: AC | PRN

## 2020-05-12 MED ORDER — OXYCODONE HCL 5 MG PO TABS: 5.0000 mg | ORAL_TABLET | Freq: Four times a day (QID) | ORAL | 0 refills | Status: DC | PRN

## 2020-05-12 MED ORDER — GABAPENTIN 300 MG PO CAPS: ORAL_CAPSULE | ORAL | 0 refills | Status: DC

## 2020-05-12 MED ORDER — METHOCARBAMOL 500 MG PO TABS: 500.0000 mg | ORAL_TABLET | Freq: Four times a day (QID) | ORAL | 0 refills | Status: DC | PRN

## 2020-05-12 NOTE — Progress Notes (Signed)
Physical Therapy Treatment Patient Details Name: Laura [name] MRN: [id number] DOB: 08-11-1955 Today's Date: 05/12/2020    History of Present Illness Patient is a 65 y.o. female s/p Rt TKA on 05/11/2020 with PMH significant for pulomnary embolus, pre-diabetes, obesity, HTN, asthma, PA, anemia, and Lt TKA (2018).    PT Comments    POD # 1 am session General bed mobility comments: demonstrated and instructed how to use belt loop to self assist LE General transfer comment: 25% VC's on proper hand plaecement as well as safety with turns.  Also assisted to bathroom. General Gait Details: 25% VC's on proper sequencing and safety with turns.  Pt tolerated amb to bathroom and then in hallway a functional distance. Then returned to room to perform some TE's following HEP handout.  Instructed on proper tech, freq as well as use of ICE.   Pt will need another PT session to address stairs and complete HEP.   Follow Up Recommendations  Follow surgeon's recommendation for DC plan and follow-up therapies;Home health PT     Equipment Recommendations  None recommended by PT    Recommendations for Other Services       Precautions / Restrictions Precautions Precautions: Fall Precaution Comments: instructed no pillow under knee Restrictions Weight Bearing Restrictions: No Other Position/Activity Restrictions: WBAT    Mobility  Bed Mobility Overal bed mobility: Needs Assistance Bed Mobility: Supine to Sit     Supine to sit: Supervision;Min guard     General bed mobility comments: demonstrated and instructed how to use belt loop to self assist LE    Transfers Overall transfer level: Needs assistance Equipment used: Rolling walker (2 wheeled) Transfers: Sit to/from [employer] Sit to Stand: Supervision Stand pivot transfers: Supervision;Min guard       General transfer comment: 25% VC's on proper hand plaecement as well as safety with turns.  Also assisted to  bathroom.  Ambulation/Gait Ambulation/Gait assistance: Supervision;Min guard Gait Distance (Feet): 55 Feet Assistive device: Rolling walker (2 wheeled) Gait Pattern/deviations: Step-to pattern;Decreased stance time - right Gait velocity: decreased   General Gait Details: 25% VC's on proper sequencing and safety with turns.  Pt tolerated amb to bathroom and then in hallway a functional distance.   Stairs             Wheelchair Mobility    Modified Rankin (Stroke Patients Only)       Balance                                            Cognition Arousal/Alertness: Awake/alert Behavior During Therapy: WFL for tasks assessed/performed Overall Cognitive Status: Within Functional Limits for tasks assessed                                 General Comments: AxO x 3 very motivated and familiar from prior TKR      Exercises   Total Knee Replacement TE's following HEP handout 10 reps B LE ankle pumps 05 reps towel squeezes 05 reps knee presses 05 reps heel slides  05 reps SAQ's 05 reps SLR's 05 reps ABD Educated on use of gait belt to assist with TE's Followed by ICE    General Comments        Pertinent Vitals/Pain Pain Assessment: 0-10 Pain Score: 3  Pain Location: Rt Knee  Pain Descriptors / Indicators: Aching;Discomfort Pain Intervention(s): Monitored during session;Premedicated before session;Repositioned;Ice applied    Home Living                      Prior Function            PT Goals (current goals can now be found in the care plan section) Progress towards PT goals: Progressing toward goals    Frequency    7X/week      PT Plan Current plan remains appropriate    Co-evaluation              AM-PAC PT "6 Clicks" Mobility   Outcome Measure  Help needed turning from your back to your side while in a flat bed without using bedrails?: A Little Help needed moving from lying on your back to sitting  on the side of a flat bed without using bedrails?: A Little Help needed moving to and from a bed to a chair (including a wheelchair)?: A Little Help needed standing up from a chair using your arms (e.g., wheelchair or bedside chair)?: A Little Help needed to walk in hospital room?: A Little Help needed climbing 3-5 steps with a railing? : A Little 6 Click Score: 18    End of Session Equipment Utilized During Treatment: Gait belt Activity Tolerance: Patient tolerated treatment well Patient left: in chair;with call bell/phone within reach Nurse Communication: Mobility status PT Visit Diagnosis: Muscle weakness (generalized) (M62.81);Difficulty in walking, not elsewhere classified (R26.2);Pain Pain - Right/Left: Right Pain - part of body: Knee     Time: 2671-2458 PT Time Calculation (min) (ACUTE ONLY): 28 min  Charges:  $Gait Training: 8-22 mins $Therapeutic Exercise: 8-22 mins                     Felecia Shelling  PTA Acute  Rehabilitation Services Pager      [phone] Office      [phone]

## 2020-05-12 NOTE — TOC Transition Note (Signed)
Transition of Care [facility]) - CM/SW Discharge Note   Patient Details  Name: Laura [name] MRN: [id number] Date of Birth: [birth year]  [facility] of [facility] CM/SW Contact:  Lennart Pall, LCSW Phone Number: 05/12/2020, 9:21 AM   Clinical Narrative:    Met briefly with pt and confirming she has all needed DME at home. Plan for OPPT at [facility].  No TOC needs.   Final next level of care: OP Rehab Barriers to Discharge: No Barriers Identified   Patient Goals and CMS Choice Patient states their goals for this hospitalization and ongoing recovery are:: go home      Discharge Placement                       Discharge Plan and Services                DME Arranged: N/A DME Agency: NA                  Social Determinants of Health (SDOH) Interventions     Readmission Risk Interventions No flowsheet data found.

## 2020-05-12 NOTE — Progress Notes (Signed)
Pt and pts husband both verbalize understanding of all discharge instructions. All questions answered. Pt to care via w/c with all belongings, and discharge instructions.

## 2020-05-12 NOTE — Progress Notes (Signed)
Subjective: 1 Day Post-Op Procedure(s) (LRB): TOTAL KNEE ARTHROPLASTY (Right) Patient reports pain as mild.   Patient seen in rounds by Dr. Lequita Halt. Patient is well, and has had no acute complaints or problems other than soreness in the right knee. No issues overnight. Denies chest pain, SOB, or calf pain. Voiding without difficulty. We will continue therapy today.   Objective: Vital signs in last 24 hours: Temp:  [97.4 F (36.3 C)-98.5 F (36.9 C)] 97.7 F (36.5 C) (03/01 0536) Pulse Rate:  [58-77] 71 (03/01 0536) Resp:  [7-28] 18 (03/01 0536) BP: (109-138)/(61-86) 109/72 (03/01 0536) SpO2:  [96 %-100 %] 100 % (03/01 0536)  Intake/Output from previous day:  Intake/Output Summary (Last 24 hours) at 05/12/2020 0707 Last data filed at 05/12/2020 0600 Gross per 24 hour  Intake 4257.92 ml  Output 1380 ml  Net 2877.92 ml     Intake/Output this shift: No intake/output data recorded.  Labs: Recent Labs    05/12/20 0301  HGB 11.5*   Recent Labs    05/12/20 0301  WBC 12.4*  RBC 3.90  HCT 37.1  PLT 305   Recent Labs    05/12/20 0301  NA 134*  K 4.5  CL 99  CO2 28  BUN 18  CREATININE 0.89  GLUCOSE 139*  CALCIUM 8.8*   No results for input(s): LABPT, INR in the last 72 hours.  Exam: General - Patient is Alert and Oriented Extremity - Neurologically intact Neurovascular intact Sensation intact distally Dorsiflexion/Plantar flexion intact Dressing - dressing C/D/I Motor Function - intact, moving foot and toes well on exam.   Past Medical History:  Diagnosis Date  . Anemia, normocytic normochromic 07/04/2017   Chronic Hb 10 +/- 0.5 gms  . Arthritis   . Asthma    exercise induced   . Edema   . 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 Herpes   . High blood pressure   . History of blood clots   . Obesity   . Partial tear of Achilles tendon   . Pneumonia   . 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 Pre-diabetes   . Pulmonary embolism (HCC)   . 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 Vitamin D deficiency     Assessment/Plan: 1 Day Post-Op Procedure(s) (LRB): TOTAL KNEE ARTHROPLASTY (Right) Active Problems:   Primary osteoarthritis of right knee  Estimated body mass index is 41.21 kg/m as calculated from the following:   Height as of this encounter: 5' 6.5" (1.689 m).   Weight as of this encounter: 117.6 kg. Advance diet Up with therapy D/C IV fluids   Patient's anticipated LOS is less than 2 midnights, meeting these requirements: - Younger than 48 - Lives within 1 hour of care - Has a competent adult at home to recover with post-op recover - NO history of  - Chronic pain requiring opiods  - Diabetes  - Coronary Artery Disease  - Heart failure  - Heart attack  - Stroke  - DVT/VTE  - Cardiac arrhythmia  - Respiratory Failure/COPD  - Renal failure  - Anemia  - Advanced Liver disease  DVT Prophylaxis - Xarelto Weight bearing as tolerated. Continue therapy.  Plan is to go Home after hospital stay. Plan for discharge later today after two sessions of PT if meeting goals. Scheduled for outpatient physical therapy at [facility].  Follow-up in the office March 15th.  The PDMP database was reviewed today prior to any opioid medications being prescribed to this patient.  Arther Abbott, PA-C Orthopedic Surgery [phone] 05/12/2020, 7:07 AM

## 2020-05-12 NOTE — Progress Notes (Signed)
Physical Therapy Treatment Patient Details Name: Laura [name] MRN: [id number] DOB: 01/01/1956 Today's Date: 05/12/2020    History of Present Illness Patient is a 65 y.o. female s/p Rt TKA on 05/11/2020 with PMH significant for pulomnary embolus, pre-diabetes, obesity, HTN, asthma, PA, anemia, and Lt TKA (2018).    PT Comments    POD # 1 pm session Spouse present for "hands on" training on all mobility esp the stairs.  General Gait Details: 25% VC's on proper sequencing and safety with turns.  Pt tolerated amb to bathroom and then in hallway a functional distance. Returned to room then performed seated TE's.  Addressed all mobility questions, discussed appropriate activity, educated on use of ICE.  Pt ready for D/C to home.   Follow Up Recommendations  Follow surgeon's recommendation for DC plan and follow-up therapies;Home health PT     Equipment Recommendations  None recommended by PT    Recommendations for Other Services       Precautions / Restrictions Precautions Precautions: Fall Precaution Comments: instructed no pillow under knee Restrictions Weight Bearing Restrictions: No Other Position/Activity Restrictions: WBAT    Mobility  Bed Mobility Overal bed mobility: Needs Assistance Bed Mobility: Supine to Sit     Supine to sit: Supervision;Min guard     General bed mobility comments: OOB in recliner    Transfers Overall transfer level: Needs assistance Equipment used: Rolling walker (2 wheeled) Transfers: Sit to/from [employer] Sit to Stand: Supervision Stand pivot transfers: Supervision;Min guard       General transfer comment: 25% VC's on proper hand plaecement as well as safety with turns.  Also assisted to bathroom.  Ambulation/Gait Ambulation/Gait assistance: Supervision;Min guard Gait Distance (Feet): 55 Feet Assistive device: Rolling walker (2 wheeled) Gait Pattern/deviations: Step-to pattern;Decreased stance time - right Gait  velocity: decreased   General Gait Details: 25% VC's on proper sequencing and safety with turns.  Pt tolerated amb to bathroom and then in hallway a functional distance.   Stairs Stairs: Yes Stairs assistance: Min assist Stair Management: No rails;Step to pattern;Forwards;With walker Number of Stairs: 2 General stair comments: with spouse "hands on" assisted up 2 steps forward with walker as rails and 50% VC's on proper walker placement and sequencing.   Wheelchair Mobility    Modified Rankin (Stroke Patients Only)       Balance                                            Cognition Arousal/Alertness: Awake/alert Behavior During Therapy: WFL for tasks assessed/performed Overall Cognitive Status: Within Functional Limits for tasks assessed                                 General Comments: AxO x 3 very motivated and familiar from prior TKR      Exercises   5 reps seated TE's    General Comments        Pertinent Vitals/Pain Pain Assessment: 0-10 Pain Score: 3  Pain Location: Rt Knee Pain Descriptors / Indicators: Aching;Discomfort Pain Intervention(s): Monitored during session;Premedicated before session;Repositioned;Ice applied    Home Living                      Prior Function            PT Goals (  current goals can now be found in the care plan section) Progress towards PT goals: Progressing toward goals    Frequency    7X/week      PT Plan Current plan remains appropriate    Co-evaluation              AM-PAC PT "6 Clicks" Mobility   Outcome Measure  Help needed turning from your back to your side while in a flat bed without using bedrails?: A Little Help needed moving from lying on your back to sitting on the side of a flat bed without using bedrails?: A Little Help needed moving to and from a bed to a chair (including a wheelchair)?: A Little Help needed standing up from a chair using your arms  (e.g., wheelchair or bedside chair)?: A Little Help needed to walk in hospital room?: A Little Help needed climbing 3-5 steps with a railing? : A Little 6 Click Score: 18    End of Session Equipment Utilized During Treatment: Gait belt Activity Tolerance: Patient tolerated treatment well Patient left: in chair;with call bell/phone within reach Nurse Communication: Mobility status PT Visit Diagnosis: Muscle weakness (generalized) (M62.81);Difficulty in walking, not elsewhere classified (R26.2);Pain Pain - Right/Left: Right Pain - part of body: Knee     Time: 1694-5038 PT Time Calculation (min) (ACUTE ONLY): 27 min  Charges:  $Gait Training: 8-22 mins $Therapeutic Activity: 8-22 mins                     Felecia Shelling  PTA Acute  Rehabilitation Services Pager      [phone] Office      [phone]

## 2020-05-13 NOTE — Discharge Summary (Signed)
Physician Discharge Summary   Patient ID: Laura [name] MRN: [id number] DOB/AGE: 1955-10-11 65 y.o.  Admit date: 05/11/2020 Discharge date: 05/12/2020  Primary Diagnosis: Osteoarthritis, right knee   Admission Diagnoses:  Past Medical History:  Diagnosis Date  . Anemia, normocytic normochromic 07/04/2017   Chronic Hb 10 +/- 0.5 gms  . Arthritis   . Asthma    exercise induced   . Edema   . 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 Herpes   . High blood pressure   . History of blood clots   . Obesity   . Partial tear of Achilles tendon   . Pneumonia   . 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 Pre-diabetes   . Pulmonary embolism (HCC)   . 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 Vitamin D deficiency    Discharge Diagnoses:   Active Problems:   Primary osteoarthritis of right knee  Estimated body mass index is 41.21 kg/m as calculated from the following:   Height as of this encounter: 5' 6.5" (1.689 m).   Weight as of this encounter: 117.6 kg.  Procedure:  Procedure(s) (LRB): TOTAL KNEE ARTHROPLASTY (Right)   Consults: None  HPI: Laura [name] is a 65 y.o. year old female with end stage OA of her right knee with progressively worsening pain and dysfunction. She has constant pain, with activity and at rest and significant functional deficits with difficulties even with ADLs. She has had extensive non-op management including analgesics, injections of cortisone and viscosupplements, and home exercise program, but remains in significant pain with significant dysfunction.Radiographs show bone on bone arthritis medial and patellofemoral. She presents now for right Total Knee Arthroplasty.    Laboratory Data: Admission on 05/11/2020, Discharged on 05/12/2020  Component Date Value Ref Range Status  . Glucose-Capillary 05/11/2020 86  70 - 99 mg/dL Final    Glucose reference range applies only to samples taken after fasting for at least 8 hours.  . Comment 1 05/11/2020 Notify RN   Final  . Comment 2 05/11/2020 Document in Chart   Final  . WBC 05/12/2020 12.4* 4.0 - 10.5 K/uL Final  . RBC 05/12/2020 3.90  3.87 - 5.11 MIL/uL Final  . Hemoglobin 05/12/2020 11.5* 12.0 - 15.0 g/dL Final  . HCT 08/65/7846 37.1  36.0 - 46.0 % Final  . MCV 05/12/2020 95.1  80.0 - 100.0 fL Final  . MCH 05/12/2020 29.5  26.0 - 34.0 pg Final  . MCHC 05/12/2020 31.0  30.0 - 36.0 g/dL Final  . RDW 96/29/5284 14.3  11.5 - 15.5 % Final  . Platelets 05/12/2020 305  150 - 400 K/uL Final  . nRBC 05/12/2020 0.0  0.0 - 0.2 % Final   Performed at [facility], [address] [place], [place] [address]  . Sodium 05/12/2020 134* 135 - 145 mmol/L Final  . Potassium 05/12/2020 4.5  3.5 - 5.1 mmol/L Final  . Chloride 05/12/2020 99  98 - 111 mmol/L Final  . CO2 05/12/2020 28  22 - 32 mmol/L Final  . Glucose, Bld 05/12/2020 139* 70 - 99 mg/dL Final   Glucose reference range applies only to samples taken after fasting for at least 8 hours.  . BUN 05/12/2020 18  8 - 23 mg/dL Final  . Creatinine, Ser 05/12/2020 0.89  0.44 - 1.00 mg/dL Final  . Calcium 03/16/7251 8.8*  8.9 - 10.3 mg/dL Final  . GFR, Estimated 05/12/2020 >60  >60 mL/min Final   Comment: (NOTE) Calculated using the CKD-EPI Creatinine Equation (2021)   . Anion gap 05/12/2020 7  5 - 15 Final   Performed at [facility], [address] [place], [place] [address]  Hospital Outpatient Visit on 05/07/2020  Component Date Value Ref Range Status  . SARS Coronavirus 2 05/07/2020 NEGATIVE  NEGATIVE Final   Comment: (NOTE) SARS-CoV-2 target nucleic acids are NOT DETECTED.  The SARS-CoV-2 RNA is generally detectable in upper and lower respiratory specimens during the acute phase of infection. Negative results do not preclude SARS-CoV-2 infection, do not rule out co-infections with other  pathogens, and should not be used as the sole basis for treatment or other patient management decisions. Negative results must be combined with clinical observations, patient history, and epidemiological information. The expected result is Negative.  Fact Sheet for Patients: HairSlick.no  Fact Sheet for Healthcare Providers: quierodirigir.com  This test is not yet approved or cleared by the [place] FDA and  has been authorized for detection and/or diagnosis of SARS-CoV-2 by FDA under an Emergency Use Authorization (EUA). This EUA will remain  in effect (meaning this test can be used) for the duration of the COVID-19 declaration under Se                          ction 564(b)(1) of the Act, 21 U.S.C. section 360bbb-3(b)(1), unless the authorization is terminated or revoked sooner.  Performed at [facility], [address] [place], [place] [address]   Hospital Outpatient Visit on 04/29/2020  Component Date Value Ref Range Status  . WBC 04/29/2020 8.1  4.0 - 10.5 K/uL Final  . RBC 04/29/2020 4.07  3.87 - 5.11 MIL/uL Final  . Hemoglobin 04/29/2020 12.0  12.0 - 15.0 g/dL Final  . HCT 41/96/2229 38.8  36.0 - 46.0 % Final  . MCV 04/29/2020 95.3  80.0 - 100.0 fL Final  . MCH 04/29/2020 29.5  26.0 - 34.0 pg Final  . MCHC 04/29/2020 30.9  30.0 - 36.0 g/dL Final  . RDW 79/89/2119 14.0  11.5 - 15.5 % Final  . Platelets 04/29/2020 311  150 - 400 K/uL Final  . nRBC 04/29/2020 0.0  0.0 - 0.2 % Final   Performed at [facility], [address] [place], [place] [address]  . Sodium 04/29/2020 135  135 - 145 mmol/L Final  . Potassium 04/29/2020 3.8  3.5 - 5.1 mmol/L Final  . Chloride 04/29/2020 99  98 - 111 mmol/L Final  . CO2 04/29/2020 27  22 - 32 mmol/L Final  . Glucose, Bld 04/29/2020 97  70 - 99 mg/dL Final   Glucose reference range applies only to samples taken after fasting for at least 8 hours.  .  BUN 04/29/2020 25* 8 - 23 mg/dL Final  . Creatinine, Ser 04/29/2020 0.97  0.44 - 1.00 mg/dL Final  . Calcium 81/44/8185 9.5  8.9 - 10.3 mg/dL Final  . Total Protein 04/29/2020 8.0  6.5 - 8.1 g/dL Final  . Albumin 63/14/9702 3.7  3.5 - 5.0 g/dL Final  . AST 63/78/5885 20  15 - 41 U/L Final  . ALT 04/29/2020 12  0 - 44 U/L Final  . Alkaline Phosphatase 04/29/2020 50  38 - 126 U/L Final  . Total Bilirubin 04/29/2020 0.8  0.3 - 1.2 mg/dL Final  . GFR, Estimated 04/29/2020 >  60  >60 mL/min Final   Comment: (NOTE) Calculated using the CKD-EPI Creatinine Equation (2021)   . Anion gap 04/29/2020 9  5 - 15 Final   Performed at [facility], [address], [place], [place] [address]  . Prothrombin Time 04/29/2020 19.6* 11.4 - 15.2 seconds Final  . INR 04/29/2020 1.7* 0.8 - 1.2 Final   Comment: (NOTE) INR goal varies based on device and disease states. Performed at [facility], [address], [place], [place] [address]   . aPTT 04/29/2020 35  24 - 36 seconds Final   Performed at [facility], [address] [place], [place] [address]  . ABO/RH(D) 04/29/2020 B POS   Final  . Antibody Screen 04/29/2020 NEG   Final  . Sample Expiration 04/29/2020 05/13/2020,2359   Final  . Extend sample reason 04/29/2020    Final                   Value:NO TRANSFUSIONS OR PREGNANCY IN THE PAST 3 MONTHS Performed at [facility], [address] [place], [place] [address]   . Hgb A1c MFr Bld 04/29/2020 5.7* 4.8 - 5.6 % Final   Comment: (NOTE)         Prediabetes: 5.7 - 6.4         Diabetes: >6.4         Glycemic control for adults with diabetes: <7.0   . Mean Plasma Glucose 04/29/2020 117  mg/dL Final   Comment: (NOTE) Performed At: [facility] [address] [place], [place] [id number] Jolene Schimke MD MV:[id number]   . MRSA, PCR 04/29/2020 NEGATIVE  NEGATIVE Final  . Staphylococcus aureus 04/29/2020 NEGATIVE   NEGATIVE Final   Comment: (NOTE) The Xpert SA Assay (FDA approved for NASAL specimens in patients 61 years of age and older), is one component of a comprehensive surveillance program. It is not intended to diagnose infection nor to guide or monitor treatment. Performed at [facility], [address], [place], [place] [address]   . Glucose-Capillary 04/29/2020 87  70 - 99 mg/dL Final   Glucose reference range applies only to samples taken after fasting for at least 8 hours.     X-Rays:No results found.  EKG: Orders placed or performed in visit on 08/26/19  . EKG 12-Lead     Hospital Course: Laura [name] is a 65 y.o. who was admitted to [facility]. They were brought to the operating room on 05/11/2020 and underwent Procedure(s): TOTAL KNEE ARTHROPLASTY.  Patient tolerated the procedure well and was later transferred to the recovery room and then to the orthopaedic floor for postoperative care. They were given PO and IV analgesics for pain control following their surgery. They were given 24 hours of postoperative antibiotics of  Anti-infectives (From admission, onward)   Start     Dose/Rate Route Frequency Ordered Stop   05/11/20 1400  ceFAZolin (ANCEF) IVPB 2g/100 mL premix        2 g 200 mL/hr over 30 Minutes Intravenous Every 6 hours 05/11/20 1111 05/11/20 2053   05/11/20 0630  ceFAZolin (ANCEF) IVPB 2g/100 mL premix        2 g 200 mL/hr over 30 Minutes Intravenous On call to O.R. 05/11/20 4401 05/11/20 0826     and started on DVT prophylaxis in the form of Xarelto.   PT and OT were ordered for total joint protocol. Discharge planning consulted to help with postop disposition and  equipment needs.  Patient had a good night on the evening of surgery. They started to get up OOB with therapy on POD #0. Pt was seen during rounds and was ready to go home pending progress with therapy. She worked with therapy on POD #1 and was meeting her goals. Pt was discharged  to home later that day in stable condition.  Diet: Regular diet Activity: WBAT Follow-up: in 2 weeks Disposition: Home with OPPT Discharged Condition: stable   Discharge Instructions    Call MD / Call 911   Complete by: As directed    If you experience chest pain or shortness of breath, CALL 911 and be transported to the hospital emergency room.  If you develope a fever above 101 F, pus (white drainage) or increased drainage or redness at the wound, or calf pain, call your surgeon's office.   Change dressing   Complete by: As directed    You may remove the bulky bandage (ACE wrap and gauze) two days after surgery. You will have an adhesive waterproof bandage underneath. Leave this in place until your first follow-up appointment.   Constipation Prevention   Complete by: As directed    Drink plenty of fluids.  Prune juice may be helpful.  You may use a stool softener, such as Colace (over the counter) 100 mg twice a day.  Use MiraLax (over the counter) for constipation as needed.   Diet - low sodium heart healthy   Complete by: As directed    Do not put a pillow under the knee. Place it under the heel.   Complete by: As directed    Driving restrictions   Complete by: As directed    No driving for two weeks   TED hose   Complete by: As directed    Use stockings (TED hose) for three weeks on both leg(s).  You may remove them at night for sleeping.   Weight bearing as tolerated   Complete by: As directed      Allergies as of 05/12/2020   No Known Allergies     Medication List    STOP taking these medications   Pennsaid 2 % Soln Generic drug: Diclofenac Sodium     TAKE these medications   albuterol 108 (90 Base) MCG/ACT inhaler Commonly known as: VENTOLIN HFA Inhale 1-2 puffs into the lungs every 6 (six) hours as needed for wheezing or shortness of breath.   gabapentin 300 MG capsule Commonly known as: NEURONTIN Take a 300 mg capsule three times a day for two weeks  following surgery.Then take a 300 mg capsule two times a day for two weeks. Then take a 300 mg capsule once a day for two weeks. Then discontinue.   hydrochlorothiazide 25 MG tablet Commonly known as: HYDRODIURIL Take 25 mg by mouth daily.   lisinopril 10 MG tablet Commonly known as: ZESTRIL Take 10 mg by mouth daily.   metFORMIN 500 MG tablet Commonly known as: GLUCOPHAGE Take 1 tablet (500 mg total) by mouth 2 (two) times daily with a meal.   methocarbamol 500 MG tablet Commonly known as: ROBAXIN Take 1 tablet (500 mg total) by mouth every 6 (six) hours as needed for muscle spasms.   ondansetron 4 MG tablet Commonly known as: ZOFRAN Take 1 tablet (4 mg total) by mouth every 6 (six) hours as needed for nausea.   oxyCODONE 5 MG immediate release tablet Commonly known as: Oxy IR/ROXICODONE Take 1-2 tablets (5-10 mg total) by mouth every 6 (  six) hours as needed for severe pain. Not to exceed 6 tablets a day.   Ozempic (1 MG/DOSE) 2 MG/1.5ML Sopn Generic drug: Semaglutide (1 MG/DOSE) Inject 1 mg into the skin once a week.   rivaroxaban 10 MG Tabs tablet Commonly known as: Xarelto Take 1 tablet (10 mg total) by mouth daily. What changed: when to take this   traMADol 50 MG tablet Commonly known as: ULTRAM Take 1-2 tablets (50-100 mg total) by mouth every 6 (six) hours as needed for moderate pain.   triamcinolone 0.1 % Commonly known as: KENALOG Apply 1 application topically daily as needed.   Vitamin D (Ergocalciferol) 1.25 MG (50000 UNIT) Caps capsule Commonly known as: DRISDOL Take 1 capsule (50,000 Units total) by mouth every 7 (seven) days.            Discharge Care Instructions  (From admission, onward)         Start     Ordered   05/12/20 0000  Weight bearing as tolerated        05/12/20 0712   05/12/20 0000  Change dressing       Comments: You may remove the bulky bandage (ACE wrap and gauze) two days after surgery. You will have an adhesive waterproof  bandage underneath. Leave this in place until your first follow-up appointment.   05/12/20 [id number]          Follow-up Information    Ollen GrossAluisio, Frank, MD. Schedule an appointment as soon as possible for a visit on 05/26/2020.   Specialty: Orthopedic Surgery Contact information: [address] [place] [id number] [phone]               Signed: Arther AbbottKristie Karna Abed, PA-C Orthopedic Surgery 05/13/2020, 9:07 AM

## 2020-10-06 ENCOUNTER — Encounter: Payer: BC Managed Care – PPO | Admitting: Occupational Therapy

## 2020-10-06 ENCOUNTER — Ambulatory Visit: Payer: BC Managed Care – PPO | Admitting: Occupational Therapy

## 2020-10-06 ENCOUNTER — Other Ambulatory Visit (INDEPENDENT_AMBULATORY_CARE_PROVIDER_SITE_OTHER): Payer: Self-pay | Admitting: Family Medicine

## 2020-10-06 DIAGNOSIS — E559 Vitamin D deficiency, unspecified: Secondary | ICD-10-CM

## 2020-10-09 ENCOUNTER — Other Ambulatory Visit: Payer: Self-pay

## 2020-10-09 ENCOUNTER — Encounter: Payer: BC Managed Care – PPO | Admitting: Occupational Therapy

## 2020-10-09 ENCOUNTER — Ambulatory Visit: Payer: BC Managed Care – PPO | Attending: Family Medicine | Admitting: Occupational Therapy

## 2020-10-09 DIAGNOSIS — I890 Lymphedema, not elsewhere classified: Secondary | ICD-10-CM | POA: Diagnosis not present

## 2020-10-09 NOTE — Patient Instructions (Signed)

## 2020-10-09 NOTE — Therapy (Signed)
[facility] MAIN [facility] [address] [place], [place], [address] Phone: [phone]   Fax:  [phone]  Occupational [name] Evaluation  Patient Details  Name: Laura NorthLori [name] MRN: [id number] Date of Birth: 1955-11-03 Referring Provider (OT): Laurann Montanaynthia White, MD   Encounter Date: 10/09/2020   OT End of Session - 10/09/20 1101     Visit Number 1    Number of Visits 36    Date for OT Re-Evaluation 01/07/21    OT Start Time 0903    OT Stop Time 1015    OT Time Calculation (min) 72 min    Activity Tolerance Patient tolerated treatment well;No increased pain    Behavior [name] for tasks assessed/performed             Past Medical History:  Diagnosis Date   Anemia, normocytic normochromic 07/04/2017   Chronic Hb 10 +/- 0.5 gms   Arthritis    Asthma    exercise induced    Edema    Frozen, subsequent encounter 02/2013   Diaphragm   Herpes    High blood pressure    History of blood clots    Obesity    Partial tear of Achilles tendon    Pneumonia    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   IgG 1,919 mg %; normal IgM & IgA 04/25/16; no monoclonal protein on IFE   Pre-diabetes    Pulmonary embolism (HCC)    Pulmonary embolus (HCC) 05/30/2011   July, 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   Subjective Assessment - 10/09/20 0917     Subjective  Laura [name]    Pertinent  History Onset chronic leg swelling in 40's; HTN, OA, s/p TKA x 2: L 2019, R 2022; Hx PE, Obesity    Limitations impaired body image- disguises leg swelling. Difficulty fitting LB clothing and street shoes.    Repetition Increases Symptoms    Special Tests + Stemmer; Intake FOTO 68/100    Patient Stated Goals reduce swelling and keep it from getting worse, increased infection risk    Currently in Pain? No/denies    Aggravating Factors  swelling worsens w salty foods, long car rides, sitting with legs down, hot summer weather    Pain Relieving Factors exercise, cool weather,               [facility] OT Assessment - 10/09/20 0001       Assessment   Referring Provider (OT) Laurann Montanaynthia White, MD    Hand Dominance Right    Prior [name] OTS compression stockings for air travel, car rides and s/p knee sx      Precautions   Precautions Other (comment)   lymphedema   Precaution Comments Hx PE      Balance Screen   Has the patient fallen in the past 6 months No    Has  the patient had a decrease in activity level because of a fear of falling?  No      Home  Environment   Family/patient expects to be discharged to: Private residence    Living Arrangements Spouse/significant other    Type of Home House    Home Access Stairs    Home Layout One level    Bathroom Shower/Tub Walk-in Shower    Bathroom Accessibility Yes    Lives With Spouse      Prior Function   Level of Independence Independent;Independent with basic ADLs;Independent with household mobility without device;Independent with community mobility without device;Independent with homemaking with ambulation;Independent with gait;Independent with transfers;Needs assistance with ADLs    Vocation Full time employment    Vocation Requirements sitting    Leisure travel, family, cookingconcerts    Comments not consistent but have recumbent bike, walking      IADL   Prior Level of Function Shopping I    Shopping Takes care of all shopping  needs independently    Prior Level of Function Light Housekeeping I    Light Housekeeping Does personal laundry completely;Maintains house alone or with occasional assistance;Performs light daily tasks such as dishwashing, bed making    Prior Level of Function Meal Prep I    Meal Prep Plans, prepares and serves adequate meals independently    Prior Level of Function Community Mobility I    Engineer, agricultural independently on public transportation;Drives own Geophysical data processor Status Independent      Vision - History   Baseline Vision Wears glasses all the time      Activity Tolerance   Activity Tolerance Endurance does not limit participation in activity      Cognition   Overall Cognitive Status Within Functional Limits for tasks assessed      Observation/Other Assessments   Observations BLE swelling below knees , including feet.    Skin Integrity fatty fibrosis with lobules developing anterior and inferior to knees, L>R; difuse bruising below the knees bilaterally; Tissue is fatty and doughy , also includes arms and hips+Stemmer and creases at base of toes. Feet are definitely involved, unlike pure Lipedema. Shelf-like fatty ankles. No hypersensativity. Non-pitting    Focus on Therapeutic Outcomes (FOTO)  Intake 68/100      Posture/Postural Control   Posture/Postural Control No significant limitations      Sensation   Light Touch Appears Intact      Coordination   Gross Motor Movements are Fluid and Coordinated Yes      ROM / Strength   AROM / PROM / Strength AROM;Strength      AROM   Overall AROM  Within functional limits for tasks performed    AROM Assessment Site Hip;Knee;Ankle      Strength   Overall Strength Within functional limits for tasks performed              LYMPHEDEMA/ONCOLOGY QUESTIONNAIRE - 10/09/20 0001       Lymphedema Stage   Stage STAGE 2 SPONTANEOUSLY IRREVERSIBLE      Lymphedema Assessments   Lymphedema Assessments  Lower extremities      Left Lower Extremity Lymphedema   Other BLE comparative limb volumetrics TBA                    OT Treatments/Exercises (OP) - 10/09/20 0001       Bed Mobility   Bed Mobility --   [facility]  Transfers   Transfers Sit to Stand   [facility]   Sit to Stand 7: Independent      ADLs   Overall ADLs impaired body image 2/2 leg and feet swelling    LB Dressing difficulty fitting street shoes and LB clothing due to fatty swelling in legs and feet bilaterally    Driving distance driving results in increased leg and feet swelling    ADL Education Given Yes      Exercises   Exercises --   LE lymphatic pumping ther ex     Manual [name]   Manual [name] Edema management                   OT Education - 10/09/20 0931     Education Details Provided Pt education regarding lymphatic structure and function, etiology, onset patterns and stages of progression. Taught interaction between blood circulatory system and lymphatic circulation.Discussed  impact of gravity and obesity on lymphatic function. Outlined Complete Decongestive [name] (CDT)  as standard of care and provided in depth information regarding 4 primary components of both Intensive and Self Management Phases, including Manual Lymph Drainage (MLD), compression wrapping and garments, skin care, and therapeutic exercise.   Homero Fellers discussion of high burden of care and contributing impact of existing co morbidities. We discussed  Importance of daily, ongoing LE self-care essential to retaining clinical gains and limiting progression.    Person(s) Educated Patient    Methods Explanation;Demonstration;Handout    Comprehension Verbalized understanding;Need further instruction                 OT Long Term Goals - 10/09/20 1110       OT LONG TERM GOAL #1   Title Given this patient's risk-adjustment variables, her Intake Functional Status score of 68/100 on the FOTO tool, patient will experience at  least an increase in function of 3 points for a score of 71/100, or higher.    Baseline 68/100    Time 12    Period Weeks    Status New    Target Date 01/07/21      OT LONG TERM GOAL #2   Title Pt will verbalize 4 lymphedema precautions and prevention strategies using printed reference (modified independence) to reduce infection risk limit progression of lymphedema.    Baseline Max A    Period Days    Status New    Target Date --   4th OT Rx visit     OT LONG TERM GOAL #3   Title Pt will be able to apply knee length, multi-layer, short stretch compression wraps to one leg at a time using correct gradient techniques with extra time (modified independence) to decrease limb volume, reduce infection risk and limit progression of lymphedema.    Baseline dependent    Time 4    Period Days    Status New    Target Date --   4th OT Rx visit     OT LONG TERM GOAL #4   Title Pt will achieve at least 10% limb volume reduction bilaterally during Intensive Phase CDT to prevent re-accumulation of lymphatic congestion and progression of fibrosis, to limit infection risk, to improve functional ambulation and transfers, and to improve functional performance of ADLs.    Baseline Max A    Time 12    Period Weeks    Status New    Target Date 01/07/21      OT LONG TERM GOAL #5   Title Pt will  achieve and sustain a least 85% compliance with all LE self-care home program components throughout Intensive Phase CDT, including elevation when seated, recommended skin care regime, lymphatic pumping ther ex, 23/7 compression wraps and simple self-MLD.    Baseline Dependent    Time 12    Period Weeks    Status New    Target Date 01/07/21      Long Term Additional Goals   Additional Long Term Goals Yes      OT LONG TERM GOAL #6   Title By DC from OT Pt will be able to don and doff appropriate daytime compression garments using assistive devices to limit lymphatic re-accumulation and LE progression with  before transitioning to self-management phase of CDT.    Baseline max A    Time 12    Period Weeks    Status New    Target Date 01/07/21      OT LONG TERM GOAL #7   Title Pt will achieve maximum lymphedema reduction to enable functional improvements including fitting preferred LB clothing and street shoes, and to improve body image for optimal social participation.    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  Lipedema typically affects women and is characterized by painful, symmetrical, excessive deposition of fat below the waist in the legs, thighs and buttocks resulting in a disproportionate waist to hip ratio. Unlike lymphedema where swelling extends to the feet and presents as asymmetrical, in lipedema the feet are typically spared and fatty swelling is bilateral. As lipo-lymphedema progresses, however, swelling in the feet may develop.  Accompanying obesity can favor the development of lipo-lymphedema. As lipedema progresses fatty fibrosis accumulates in the subcutis putting pressure on delicate lymphatics resulting in excessive lymph accumulation and progressive lymphedema. This combined condition is difficult to treat as lipedema does not typically respond well to Complete Decongestive [name] (CDT), while the lymphedema may respond to varying degrees. Well fitting, comfortable compression garments that do not constrict or roll down often provide pain relief and enable improved functional performance. In this case chronic, progressive lipo-lymphedema limits Laura [name] ability to tolerate dependent sitting > 1 hr, limits ability to fit LB clothing and shoes, and negatively impacts body image. Laura [name] for Complete  Decongestive [name] (CDT) to include MLD, skin care, ther ex and compression therapies. Pt will also [name] from a tral with an advanced sequential pneumatic compression device ([employer] Flexitouch) to assist with long-term lipo-lymphedema self-management over time. Without skilled OT painful, chronic lipo-lymphedema will progress, infection risk will increase and further functional decline is expected.    OT Occupational Profile and History Comprehensive Assessment- Review of records and extensive additional review of physical, cognitive, psychosocial history related to current functional performance    Occupational performance deficits (Please refer to evaluation for details): ADL's;Other   impaired body image 2/2 feet and leg swelling   Body Structure / Function / Physical Skills ADL;Edema;Decreased knowledge of precautions;Decreased knowledge of use of DME;Skin integrity   body image   Rehab Potential Good    Clinical Decision Making Limited treatment options, no task modification  necessary    Comorbidities Affecting Occupational Performance: Presence of comorbidities impacting occupational performance    Modification or Assistance to Complete Evaluation  Min-Moderate modification of tasks or assist with assess necessary to complete eval    OT Frequency 2x / week    OT Duration 12 weeks    OT Treatment/Interventions Self-care/ADL training;Therapeutic exercise;Manual [name];Therapeutic activities;Manual lymph drainage;DME and/or AE instruction;Compression bandaging;Coping strategies training;Patient/family education    Plan Intensive and Self-Management Phases of Complete Decongestive [name] (CDT) : Manual Lymphatic Drainage (MLD, skin care, compression bandaging and garments, skin carether ex    OT Home Exercise Plan emphasis of CDT on swelling reduction and teaching LE self management    Recommended Other Services Consider fitting with Flexitouch advanced sequential compression device ,  or "pump". The advanced Flexitouch [phone] pneumatic compression device with BLE garments and truncal garment is medically necessary to assist the patient with long term, daily, lymphedema self-care at home. The advanced, 32-chamber Flexitouch is the only available sequential pneumatic compression device providing proximal to distal lymphatic fluid return via regional lymph nodes, deep abdominal pathways and the thoracic duct to the heart.  The Flexitouch provides proximal treatment preceded by proximal clearing of lymphatic fluid in the trunk, thigh, calf and foot. The truncal garment extends fluid return past the functional inguinal lymph nodes into the core, deep abdominal lymphatics to the thoracic duct. The basic pneumatic pump is not appropriate for this patient because it provides distal-to-proximal, retrograde massage mobilizing tissue fluid against back pressure which then abruptly ends before reaching regional LNs leaving dense, protein rich fluid distal to the lymph nodes at joints.    Consulted and Agree with Plan of Care Patient             Patient will [name] from skilled therapeutic intervention in order to improve the following deficits and impairments:   Body Structure / Function / Physical Skills: ADL, Edema, Decreased knowledge of precautions, Decreased knowledge of use of DME, Skin integrity (body image)       Visit Diagnosis: Lymphedema, not elsewhere classified - Plan: Ot plan of care cert/re-cert    Problem List Patient Active Problem List   Diagnosis Date Noted   Primary osteoarthritis of right knee 05/11/2020   Primary osteoarthritis of left shoulder 03/20/2019   Other fatigue 01/17/2018   Shortness of breath on exertion 01/17/2018   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   Anemia, normocytic normochromic 07/04/2017   OA (osteoarthritis) of knee 09/05/2016   Morbid obesity (HCC) 11/26/2014   Right Achilles tendinitis 08/21/2014   Pulmonary embolus (HCC) 05/30/2011    Coagulopathy (HCC) 05/30/2011   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

## 2020-10-15 ENCOUNTER — Encounter: Payer: BC Managed Care – PPO | Admitting: Occupational Therapy

## 2020-10-20 ENCOUNTER — Encounter: Payer: BC Managed Care – PPO | Admitting: Occupational Therapy

## 2020-10-27 ENCOUNTER — Encounter: Payer: BC Managed Care – PPO | Admitting: Occupational Therapy

## 2020-10-29 ENCOUNTER — Ambulatory Visit: Payer: BC Managed Care – PPO | Admitting: Occupational Therapy

## 2020-11-03 ENCOUNTER — Encounter: Payer: BC Managed Care – PPO | Admitting: Occupational Therapy

## 2020-11-05 ENCOUNTER — Ambulatory Visit: Payer: BC Managed Care – PPO | Admitting: Occupational Therapy

## 2020-11-10 ENCOUNTER — Encounter: Payer: BC Managed Care – PPO | Admitting: Occupational Therapy

## 2020-11-11 ENCOUNTER — Encounter (HOSPITAL_COMMUNITY): Payer: Self-pay

## 2020-11-11 ENCOUNTER — Emergency Department (HOSPITAL_COMMUNITY)
Admission: EM | Admit: 2020-11-11 | Discharge: 2020-11-11 | Disposition: A | Discharge status: Home / Self Care | Payer: BC Managed Care – PPO | Attending: Emergency Medicine | Admitting: Emergency Medicine

## 2020-11-11 ENCOUNTER — Other Ambulatory Visit: Payer: Self-pay

## 2020-11-11 ENCOUNTER — Emergency Department (HOSPITAL_BASED_OUTPATIENT_CLINIC_OR_DEPARTMENT_OTHER)
Admit: 2020-11-11 | Discharge: 2020-11-11 | Disposition: A | Discharge status: Home / Self Care | Payer: BC Managed Care – PPO | Attending: Emergency Medicine | Admitting: Emergency Medicine

## 2020-11-11 DIAGNOSIS — J45909 Unspecified asthma, uncomplicated: Secondary | ICD-10-CM | POA: Insufficient documentation

## 2020-11-11 DIAGNOSIS — M7989 Other specified soft tissue disorders: Secondary | ICD-10-CM

## 2020-11-11 DIAGNOSIS — R600 Localized edema: Secondary | ICD-10-CM | POA: Insufficient documentation

## 2020-11-11 DIAGNOSIS — R200 Anesthesia of skin: Secondary | ICD-10-CM | POA: Insufficient documentation

## 2020-11-11 DIAGNOSIS — Z96653 Presence of artificial knee joint, bilateral: Secondary | ICD-10-CM | POA: Diagnosis not present

## 2020-11-11 DIAGNOSIS — Z79899 Other long term (current) drug therapy: Secondary | ICD-10-CM | POA: Insufficient documentation

## 2020-11-11 DIAGNOSIS — R202 Paresthesia of skin: Secondary | ICD-10-CM

## 2020-11-11 DIAGNOSIS — I890 Lymphedema, not elsewhere classified: Secondary | ICD-10-CM | POA: Insufficient documentation

## 2020-11-11 LAB — URINALYSIS, ROUTINE W REFLEX MICROSCOPIC
Bilirubin Urine: NEGATIVE
Glucose, UA: NEGATIVE mg/dL
Hgb urine dipstick: NEGATIVE
Ketones, ur: NEGATIVE mg/dL
Nitrite: NEGATIVE
Protein, ur: NEGATIVE mg/dL
Specific Gravity, Urine: 1.015 (ref 1.005–1.030)
pH: 6 (ref 5.0–8.0)

## 2020-11-11 LAB — CBC WITH DIFFERENTIAL/PLATELET
Abs Immature Granulocytes: 0.04 10*3/uL (ref 0.00–0.07)
Basophils Absolute: 0 10*3/uL (ref 0.0–0.1)
Basophils Relative: 0 %
Eosinophils Absolute: 0.3 10*3/uL (ref 0.0–0.5)
Eosinophils Relative: 3 %
HCT: 40.1 % (ref 36.0–46.0)
Hemoglobin: 12.4 g/dL (ref 12.0–15.0)
Immature Granulocytes: 0 %
Lymphocytes Relative: 27 %
Lymphs Abs: 2.7 10*3/uL (ref 0.7–4.0)
MCH: 29.7 pg (ref 26.0–34.0)
MCHC: 30.9 g/dL (ref 30.0–36.0)
MCV: 95.9 fL (ref 80.0–100.0)
Monocytes Absolute: 0.9 10*3/uL (ref 0.1–1.0)
Monocytes Relative: 9 %
Neutro Abs: 6.1 10*3/uL (ref 1.7–7.7)
Neutrophils Relative %: 61 %
Platelets: 338 10*3/uL (ref 150–400)
RBC: 4.18 MIL/uL (ref 3.87–5.11)
RDW: 14 % (ref 11.5–15.5)
WBC: 10 10*3/uL (ref 4.0–10.5)
nRBC: 0 % (ref 0.0–0.2)

## 2020-11-11 LAB — URINALYSIS, MICROSCOPIC (REFLEX)

## 2020-11-11 LAB — BASIC METABOLIC PANEL
Anion gap: 7 (ref 5–15)
BUN: 18 mg/dL (ref 8–23)
CO2: 31 mmol/L (ref 22–32)
Calcium: 9.9 mg/dL (ref 8.9–10.3)
Chloride: 102 mmol/L (ref 98–111)
Creatinine, Ser: 0.92 mg/dL (ref 0.44–1.00)
GFR, Estimated: >60 mL/min (ref >60)
Glucose, Bld: 88 mg/dL (ref 70–99)
Potassium: 3.6 mmol/L (ref 3.5–5.1)
Sodium: 140 mmol/L (ref 135–145)

## 2020-11-11 NOTE — ED Triage Notes (Signed)
Pt c/o numbness to her right leg starting a week ago. Pt went to UC today and was urged to come to ED to rule out DVT. Pt has hx of PE.

## 2020-11-11 NOTE — ED Provider Notes (Signed)
[facility] DEPT Provider Note   CSN: [id number] Arrival date & time: 11/11/20  1752     History Chief Complaint  Patient presents with   Numbness    Laura [name] is a 65 y.o. female.  Patient is a 65 year old female with a history of hypertension, Polly clonal gammopathy, PE, bilateral lower extremity lymphedema who is presenting today due to numbness in her right lower leg.  Started approximately 1 week ago and has been persistent.  It is no worse but is also not resolved.  She has not noticed any worsening swelling in her leg and denies any weakness.  The symptoms do not radiate up into her medial thigh or buttocks.  She denies any back pain at this time but does report that last week she did have some sciatic symptoms which resolved and she cannot remember what side they were on.  She does have prior history of PEs but she denies any chest pain or shortness of breath at this time.  She denies any knee pain, swelling or redness.  She went to urgent care and they recommended she come here to have a DVT rule out.  The history is provided by the patient.      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   Vitamin D deficiency     Patient Active Problem List   Diagnosis Date Noted   Primary osteoarthritis of right knee 05/11/2020   Primary osteoarthritis of left shoulder 03/20/2019   Other fatigue 01/17/2018   Shortness of  breath on exertion 01/17/2018   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   Anemia, normocytic normochromic 07/04/2017   OA (osteoarthritis) of knee 09/05/2016   Morbid obesity ([place]) 11/26/2014   Right Achilles tendinitis 08/21/2014   Pulmonary embolus ([place]) 05/30/2011   Coagulopathy ([place]) 05/30/2011   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   Dyspnea 04/01/2011   Osteoarthritis of knees, bilateral 12/09/2009   HYPERTROPHY OF FAT PAD, KNEE 12/09/2009    Past Surgical History:  Procedure Laterality Date   BREAST BIOPSY     breast biopsy    CESAREAN SECTION     1993 and [place] Left 09/05/2016   Procedure: LEFT TOTAL KNEE ARTHROPLASTY;  Surgeon: Gaynelle Arabian, MD;  Location: [facility];  Service: Orthopedics;  Laterality: Left;   TOTAL KNEE ARTHROPLASTY Right 05/11/2020   Procedure: TOTAL KNEE ARTHROPLASTY;  Surgeon: Gaynelle Arabian, MD;  Location: [facility];  Service: Orthopedics;  Laterality: Right;  66mn     OB History     Gravida  2   Para  2   Term      Preterm      AB      Living  2      SAB      IAB  Ectopic      Multiple      Live Births              Family History  Problem Relation Age of Onset   Hypertension Mother    Heart failure Mother    Cancer Mother    Depression Mother    Sleep apnea Mother    Obesity Mother    Prostate cancer Father    Multiple myeloma Father    Heart failure Father    Obesity Father    Sleep apnea Father    Kidney disease Father    Sudden Cardiac Death Father    Hypertension Father    Heart attack Neg Hx    Hyperlipidemia Neg Hx    Diabetes Neg Hx    Sudden death Neg Hx    Breast cancer Neg Hx     Social History   Tobacco Use   Smoking status: Never   Smokeless tobacco: Never  Vaping Use   Vaping Use: Never used  Substance Use Topics   Alcohol use: Yes    Alcohol/week: 0.0 standard drinks    Comment: Rarely.   Drug use: No    Home Medications Prior to Admission medications    Medication Sig Start Date End Date Taking? Authorizing Provider  albuterol (VENTOLIN HFA) 108 (90 Base) MCG/ACT inhaler Inhale 1-2 puffs into the lungs every 6 (six) hours as needed for wheezing or shortness of breath.    [provider]  gabapentin (NEURONTIN) 300 MG capsule Take a 300 mg capsule three times a day for two weeks following surgery.Then take a 300 mg capsule two times a day for two weeks. Then take a 300 mg capsule once a day for two weeks. Then discontinue. 05/12/20   Edmisten, Kristie L, PA  hydrochlorothiazide (HYDRODIURIL) 25 MG tablet Take 25 mg by mouth daily.    [provider]  lisinopril (ZESTRIL) 10 MG tablet Take 10 mg by mouth daily.    [provider]  metFORMIN (GLUCOPHAGE) 500 MG tablet Take 1 tablet (500 mg total) by mouth 2 (two) times daily with a meal. Patient not taking: No sig reported 01/02/20   Dennard Nip D, MD  methocarbamol (ROBAXIN) 500 MG tablet Take 1 tablet (500 mg total) by mouth every 6 (six) hours as needed for muscle spasms. 05/12/20   Edmisten, Kristie L, PA  ondansetron (ZOFRAN) 4 MG tablet Take 1 tablet (4 mg total) by mouth every 6 (six) hours as needed for nausea. 05/12/20   Edmisten, Kristie L, PA  oxyCODONE (OXY IR/ROXICODONE) 5 MG immediate release tablet Take 1-2 tablets (5-10 mg total) by mouth every 6 (six) hours as needed for severe pain. Not to exceed 6 tablets a day. 05/12/20   Edmisten, Ok Anis, PA  rivaroxaban (XARELTO) 10 MG TABS tablet Take 1 tablet (10 mg total) by mouth daily. Patient taking differently: Take 10 mg by mouth every evening. 07/04/17   Annia Belt, MD  Semaglutide, 1 MG/DOSE, (OZEMPIC, 1 MG/DOSE,) 2 MG/1.5ML SOPN Inject 1 mg into the skin once a week.    [provider]  traMADol (ULTRAM) 50 MG tablet Take 1-2 tablets (50-100 mg total) by mouth every 6 (six) hours as needed for moderate pain. 05/12/20   Edmisten, Kristie L, PA  triamcinolone cream (KENALOG) 0.1 % Apply 1  application topically daily as needed. 08/07/19   [provider]  Vitamin D, Ergocalciferol, (DRISDOL) 1.25 MG (50000 UNIT) CAPS capsule Take 1 capsule (50,000  Units total) by mouth every 7 (seven) days. 01/02/20   Starlyn Skeans, MD    Allergies    Patient has no known allergies.  Review of Systems   Review of Systems  All other systems reviewed and are negative.  Physical Exam Updated Vital Signs BP (!) 139/96 (BP Location: Left Arm)   Pulse 85   Temp 98.1 F (36.7 C) (Oral)   Resp 18   SpO2 100%   Physical Exam Vitals and nursing note reviewed.  Constitutional:      General: She is not in acute distress.    Appearance: Normal appearance.  HENT:     Head: Normocephalic.  Eyes:     Pupils: Pupils are equal, round, and reactive to light.  Cardiovascular:     Rate and Rhythm: Normal rate.  Pulmonary:     Effort: Pulmonary effort is normal.  Musculoskeletal:        General: No tenderness.     Right lower leg: Edema present.     Left lower leg: Edema present.     Comments: Well-healed midline surgical scar over the knee.  Flexion greater than 90 degrees with the knee.  No pain in the popliteal fossa.  No palpable masses, warmth or erythema noted.  1+ DP pulse and normal sensation in the foot.  Lymphedema present in bilateral lower extremities appear symmetric.  Skin:    General: Skin is warm.  Neurological:     Mental Status: She is alert. Mental status is at baseline.     Sensory: No sensory deficit.     Motor: No weakness.     Gait: Gait normal.  Psychiatric:        Mood and Affect: Mood normal.        Behavior: Behavior normal.    ED Results / Procedures / Treatments   Labs (all labs ordered are listed, but only abnormal results are displayed) Labs Reviewed  URINALYSIS, ROUTINE W REFLEX MICROSCOPIC - Abnormal; Notable for the following components:      Result Value   Leukocytes,Ua TRACE (*)    All other components within normal limits  URINALYSIS,  MICROSCOPIC (REFLEX) - Abnormal; Notable for the following components:   Bacteria, UA FEW (*)    All other components within normal limits  BASIC METABOLIC PANEL  CBC WITH DIFFERENTIAL/PLATELET    EKG None  Radiology VAS [place] LOWER EXTREMITY VENOUS (DVT) (ONLY [facility])  Result Date: 11/11/2020  Lower Venous DVT Study Patient Name:  NATHALYA WOLANSKI  Date of Exam:   11/11/2020 Medical Rec #: [id number]     Accession #:    [id number] Date of Birth: February 22, 1956    Patient Gender: F Patient Age:   52 years Exam [facility]  [facility] Procedure:      VAS [place] LOWER EXTREMITY VENOUS (DVT) Referring Phys: MADISON REDWINE --------------------------------------------------------------------------------  Indications: Swelling.  Risk Factors: None identified. Limitations: Poor ultrasound/tissue interface and body habitus. Comparison Study: No prior studies. Performing Technologist: Oliver Hum RVT  Examination Guidelines: A complete evaluation includes B-mode imaging, spectral Doppler, color Doppler, and power Doppler as needed of all accessible portions of each vessel. Bilateral testing is considered an integral part of a complete examination. Limited examinations for reoccurring indications may be performed as noted. The reflux portion of the exam is performed with the patient in reverse Trendelenburg.  +---------+---------------+---------+-----------+----------+--------------+ RIGHT    CompressibilityPhasicitySpontaneityPropertiesThrombus Aging +---------+---------------+---------+-----------+----------+--------------+ CFV      Full  Yes      Yes                                 +---------+---------------+---------+-----------+----------+--------------+ SFJ      Full                                                        +---------+---------------+---------+-----------+----------+--------------+ FV Prox  Full                                                         +---------+---------------+---------+-----------+----------+--------------+ FV Mid   Full                                                        +---------+---------------+---------+-----------+----------+--------------+ FV DistalFull                                                        +---------+---------------+---------+-----------+----------+--------------+ PFV      Full                                                        +---------+---------------+---------+-----------+----------+--------------+ POP      Full           Yes      Yes                                 +---------+---------------+---------+-----------+----------+--------------+ PTV      Full                                                        +---------+---------------+---------+-----------+----------+--------------+ PERO     Full                                                        +---------+---------------+---------+-----------+----------+--------------+   +----+---------------+---------+-----------+----------+--------------+ LEFTCompressibilityPhasicitySpontaneityPropertiesThrombus Aging +----+---------------+---------+-----------+----------+--------------+ CFV Full           Yes                                          +----+---------------+---------+-----------+----------+--------------+    Summary: RIGHT: - There is no evidence of  deep vein thrombosis in the lower extremity. However, portions of this examination were limited- see technologist comments above.  - No cystic structure found in the popliteal fossa.  LEFT: - No evidence of common femoral vein obstruction.  *See table(s) above for measurements and observations.    Preliminary     Procedures Procedures   Medications Ordered in ED Medications - No data to display  ED Course  I have reviewed the triage vital signs and the nursing notes.  Pertinent labs & imaging results that were available during my care of the  patient were reviewed by me and considered in my medical decision making (see chart for details).    MDM Rules/Calculators/A&P                           65 year old female presenting today with paresthesia in the medial upper tib-fib area that is been present now for a week.  No other symptoms at this time.  Does not appear to have any acute knee issues or concern for effusion or septic joint.  Palpable distal pulse with normal capillary refill and low suspicion for acute arterial issue.  Patient does have prior history of PEs is not currently anticoagulated but DVT scan is negative for DVT.  Patient did have sciatica prior to developing the symptoms and may be radicular in nature.  However she has no weakness or concern for cord compression.  Patient given reassurance.  We will have her follow-up with Dr. Wynelle Link as planned.  She was given return precautions.  Labs done today were reassuring.  MDM   Amount and/or Complexity of Data Reviewed Clinical lab tests: ordered and reviewed Tests in the radiology section of CPT: ordered and reviewed Independent visualization of images, tracings, or specimens: yes    Final Clinical Impression(s) / ED Diagnoses Final diagnoses:  Paresthesia    Rx / DC Orders ED Discharge Orders     None        Blanchie Dessert, MD 11/11/20 2220

## 2020-11-11 NOTE — Progress Notes (Signed)
Right lower extremity venous duplex has been completed. Preliminary results can be found in CV Proc through chart review.  Results were given to [facility] Redwine PA.  11/11/20 7:20 PM Olen Cordial RVT

## 2020-11-11 NOTE — Discharge Instructions (Addendum)
No signs of blood clots today.  Blood work was normal.  May be related to the back but will most likely get better.  If you develop weakness, trouble walking, or new swelling or pain in the knee follow up sooner with Dr. Lequita Halt or your doctor.

## 2020-11-11 NOTE — ED Provider Notes (Signed)
Emergency Medicine Provider Triage Evaluation Note  Laura [name] , a 65 y.o. female  was evaluated in triage.  Pt complains of numbness to lower right leg that began 1 week ago.  Reports a history of multiple PEs and was sent from urgent care to rule out DVT.  Review of Systems  Positive: Leg tingling, no weakness Negative: Chest pain, shortness of breath, leg swelling  Physical Exam  BP (!) 140/96   Pulse 86   Temp 98.3 F (36.8 C) (Oral)   Resp 18   SpO2 99%  Gen:   Awake, no distress   Resp:  Normal effort  MSK:   Moves extremities without difficulty  Other:  Marked edema to bilateral lower legs.  Patient states that this is her normal.  Medical Decision Making  Medically screening exam initiated at 6:39 PM.  Appropriate orders placed.  Brailey Buescher was informed that the remainder of the evaluation will be completed by another provider, this initial triage assessment does not replace that evaluation, and the importance of remaining in the ED until their evaluation is complete.  Ultrasound of leg without DVT.   Woodroe Chen 11/11/20 1900    Gwyneth Sprout, MD 11/11/20 [phone]

## 2020-11-12 ENCOUNTER — Encounter: Payer: BC Managed Care – PPO | Admitting: Occupational Therapy

## 2020-11-17 ENCOUNTER — Encounter: Payer: BC Managed Care – PPO | Admitting: Occupational Therapy

## 2020-11-19 ENCOUNTER — Encounter: Payer: BC Managed Care – PPO | Admitting: Occupational Therapy

## 2020-11-20 ENCOUNTER — Other Ambulatory Visit: Payer: Self-pay | Admitting: Family Medicine

## 2020-11-20 DIAGNOSIS — M7989 Other specified soft tissue disorders: Secondary | ICD-10-CM

## 2020-11-23 ENCOUNTER — Encounter: Payer: BC Managed Care – PPO | Admitting: Occupational Therapy

## 2020-11-26 ENCOUNTER — Encounter: Payer: BC Managed Care – PPO | Admitting: Occupational Therapy

## 2020-12-01 ENCOUNTER — Encounter: Payer: BC Managed Care – PPO | Admitting: Occupational Therapy

## 2020-12-03 ENCOUNTER — Encounter: Payer: BC Managed Care – PPO | Admitting: Occupational Therapy

## 2020-12-07 ENCOUNTER — Ambulatory Visit
Admission: RE | Admit: 2020-12-07 | Discharge: 2020-12-07 | Disposition: A | Discharge status: Home / Self Care | Payer: BC Managed Care – PPO | Source: Ambulatory Visit | Attending: Family Medicine | Admitting: Family Medicine

## 2020-12-07 DIAGNOSIS — M7989 Other specified soft tissue disorders: Secondary | ICD-10-CM

## 2020-12-08 ENCOUNTER — Encounter: Payer: BC Managed Care – PPO | Admitting: Occupational Therapy

## 2020-12-10 ENCOUNTER — Ambulatory Visit: Payer: BC Managed Care – PPO | Admitting: Occupational Therapy

## 2020-12-15 ENCOUNTER — Ambulatory Visit: Payer: BC Managed Care – PPO | Admitting: Occupational Therapy

## 2020-12-17 ENCOUNTER — Ambulatory Visit: Payer: BC Managed Care – PPO | Admitting: Occupational Therapy

## 2020-12-22 ENCOUNTER — Ambulatory Visit: Payer: BC Managed Care – PPO | Admitting: Occupational Therapy

## 2020-12-23 ENCOUNTER — Encounter: Payer: Self-pay | Admitting: Family Medicine

## 2020-12-23 ENCOUNTER — Ambulatory Visit: Payer: Self-pay

## 2020-12-23 ENCOUNTER — Ambulatory Visit (INDEPENDENT_AMBULATORY_CARE_PROVIDER_SITE_OTHER): Payer: BC Managed Care – PPO | Admitting: Family Medicine

## 2020-12-23 VITALS — Ht 66.5 in | Wt 240.0 lb

## 2020-12-23 DIAGNOSIS — M19012 Primary osteoarthritis, left shoulder: Secondary | ICD-10-CM

## 2020-12-23 MED ORDER — TRIAMCINOLONE ACETONIDE 40 MG/ML IJ SUSP
40.0000 mg | Freq: Once | INTRAMUSCULAR | Status: AC
Start: 2020-12-23 — End: 2020-12-23
  Administered 2020-12-23: 40 mg via INTRA_ARTICULAR

## 2020-12-23 NOTE — Progress Notes (Signed)
Laura [name] - 65 y.o. female MRN [id number]  Date of birth: 1955-05-12  SUBJECTIVE:  Including CC & ROS.  No chief complaint on file.   Laura [name] is a 65 y.o. female that is presenting with acute on chronic left shoulder pain.  She is having pain if she lies down on the left shoulder.  Has had pain in the shoulder previously that did get resolution with injection.   Review of Systems See HPI   HISTORY: Past Medical, Surgical, Social, and Family History Reviewed & Updated per EMR.   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   Vitamin D deficiency     Past Surgical History:  Procedure Laterality Date   BREAST BIOPSY     breast biopsy    CESAREAN SECTION     1993 and [place] Left 09/05/2016   Procedure: LEFT TOTAL KNEE ARTHROPLASTY;  Surgeon: Gaynelle Arabian, MD;  Location: [facility];  Service: Orthopedics;  Laterality: Left;   TOTAL KNEE ARTHROPLASTY Right 05/11/2020   Procedure: TOTAL KNEE ARTHROPLASTY;  Surgeon: Gaynelle Arabian, MD;  Location: [facility];  Service: Orthopedics;  Laterality: Right;  10mn    Family History  Problem Relation Age of Onset   Hypertension Mother    Heart failure Mother    Cancer Mother    Depression Mother    Sleep apnea Mother    Obesity Mother    Prostate cancer Father    Multiple myeloma Father    Heart failure Father    Obesity Father    Sleep apnea  Father    Kidney disease Father    Sudden Cardiac Death Father    Hypertension Father    Heart attack Neg Hx    Hyperlipidemia Neg Hx    Diabetes Neg Hx    Sudden death Neg Hx    Breast cancer Neg Hx     Social History   Socioeconomic History   Marital status: Married    Spouse name: CMiami Latulippe  Number of children: 2   Years of education: Not on file   Highest education level: Not on file  Occupational History   Occupation: PEngineer, building services [employer]  Tobacco Use   Smoking status: Never   Smokeless tobacco: Never  Vaping Use   Vaping Use: Never used  Substance and Sexual Activity   Alcohol use: Yes    Alcohol/week: 0.0 standard drinks    Comment: Rarely.   Drug use: No   Sexual activity: Not on file  Other Topics Concern   Not on file  Social History Narrative   Not on file   Social Determinants of Health   Financial Resource Strain: Not on file  Food Insecurity: Not on file  Transportation  Needs: Not on file  Physical Activity: Not on file  Stress: Not on file  Social Connections: Not on file  Intimate Partner Violence: Not on file     PHYSICAL EXAM:  VS: Ht 5' 6.5" (1.689 m)   Wt 240 lb (108.9 kg)   BMI 38.16 kg/m  Physical Exam Gen: NAD, alert, cooperative with exam, well-appearing    Aspiration/Injection Procedure Note Fareedah Mahler 1955/10/05  Procedure: Injection Indications: Left shoulder pain  Procedure Details Consent: Risks of procedure as well as the alternatives and risks of each were explained to the (patient/caregiver).  Consent for procedure obtained. Time Out: Verified patient identification, verified procedure, site/side was marked, verified correct patient position, special equipment/implants available, medications/allergies/relevent history reviewed, required imaging and test results available.  Performed.  The area was cleaned with iodine and alcohol swabs.    The left glenohumeral joint was injected using 3  cc of 1% lidocaine on a 22-gauge 3-1/2 inch needle.  The syringe was switched and a mixture containing 1 cc's of 40 mg Kenalog and 4 cc's of 0.25% bupivacaine was injected.  Ultrasound was used. Images were obtained in short views showing the injection.     A sterile dressing was applied.  Patient did tolerate procedure well.      ASSESSMENT & PLAN:   Primary osteoarthritis of left shoulder Acute on chronic in nature.  Symptoms most consistent with the degenerative changes appreciated  -Counseled on home exercise therapy and supportive care. -Injection today. -Could consider physical therapy.

## 2020-12-23 NOTE — Patient Instructions (Signed)
Good to see you Please try ice as needed  Please keep me updated on the leg sensation   Please send me a message in MyChart with any questions or updates.  Please see me back in 4 weeks or as needed if better.   --Dr. Jordan Likes

## 2020-12-24 ENCOUNTER — Ambulatory Visit: Payer: BC Managed Care – PPO | Admitting: Occupational Therapy

## 2020-12-24 NOTE — Assessment & Plan Note (Signed)
Acute on chronic in nature.  Symptoms most consistent with the degenerative changes appreciated  -Counseled on home exercise therapy and supportive care. -Injection today. -Could consider physical therapy.

## 2020-12-29 ENCOUNTER — Ambulatory Visit: Payer: BC Managed Care – PPO | Admitting: Occupational Therapy

## 2020-12-31 ENCOUNTER — Ambulatory Visit: Payer: BC Managed Care – PPO | Admitting: Occupational Therapy

## 2021-01-05 ENCOUNTER — Ambulatory Visit: Payer: BC Managed Care – PPO | Admitting: Occupational Therapy

## 2021-01-07 ENCOUNTER — Ambulatory Visit: Payer: BC Managed Care – PPO | Admitting: Occupational Therapy

## 2021-01-12 ENCOUNTER — Ambulatory Visit: Payer: BC Managed Care – PPO | Admitting: Occupational Therapy

## 2021-01-14 ENCOUNTER — Ambulatory Visit: Payer: BC Managed Care – PPO | Admitting: Occupational Therapy

## 2021-01-19 ENCOUNTER — Ambulatory Visit: Payer: BC Managed Care – PPO | Admitting: Occupational Therapy

## 2021-01-21 ENCOUNTER — Ambulatory Visit: Payer: BC Managed Care – PPO | Admitting: Occupational Therapy

## 2021-01-26 ENCOUNTER — Ambulatory Visit: Payer: BC Managed Care – PPO | Attending: Family Medicine | Admitting: Occupational Therapy

## 2021-01-26 ENCOUNTER — Other Ambulatory Visit: Payer: Self-pay

## 2021-01-26 DIAGNOSIS — I890 Lymphedema, not elsewhere classified: Secondary | ICD-10-CM | POA: Insufficient documentation

## 2021-01-26 NOTE — Therapy (Signed)
[facility] MAIN [facility] SERVICES [address] [place], [place], [address] Phone: [phone]   Fax:  [phone]  Occupational Therapy Treatment  Patient Details  Name: Laura [name] MRN: [id number] Date of Birth: Nov 26, 1955 Referring Provider (OT): Laurann Montana, MD   Encounter Date: 01/26/2021   OT End of Session - 01/26/21 0819     Visit Number 2    Number of Visits 36    Date for OT Re-Evaluation 04/26/21    OT Start Time 0804    OT Stop Time 0910    OT Time Calculation (min) 66 min    Activity Tolerance Patient tolerated treatment well;No increased pain    Behavior During Therapy [facility] for tasks assessed/performed             Past Medical History:  Diagnosis Date   Anemia, normocytic normochromic 07/04/2017   Chronic Hb 10 +/- 0.5 gms   Arthritis    Asthma    exercise induced    Edema    Frozen, subsequent encounter 02/2013   Diaphragm   Herpes    High blood pressure    History of blood clots    Obesity    Partial tear of Achilles tendon    Pneumonia    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   IgG 1,919 mg %; normal IgM & IgA 04/25/16; no monoclonal protein on IFE   Pre-diabetes    Pulmonary embolism (HCC)    Pulmonary embolus (HCC) 05/30/2011   July, 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   Subjective Assessment - 01/26/21 0820     Subjective  [name] presents for OT Rx  visit 2/36 to address BLE/BLQ lipo-lymphedema. Ms. Pates was last seen on 10/09/20 for initial evaluation. She returns today to commence Intensive Phase Complete Decongestive Therapy (CDT). Pt denies LE-related pain this morning. She tells me she has lost weight since last seen with help from perscription medication. Pt has no new complaints and asks questions throughout our session today.    Pertinent History Onset chronic leg swelling in 40's; HTN, OA, s/p TKA x 2: L 2019, R 2022; Hx PE, Obesity    Limitations impaired body image- disguises leg swelling. Difficulty fitting LB clothing and street shoes.    Repetition Increases Symptoms    Special Tests + Stemmer; Intake FOTO 68/100    Patient Stated Goals reduce swelling and keep it from getting worse, increased infection risk    Currently in Pain? No/denies                 LYMPHEDEMA/ONCOLOGY QUESTIONNAIRE - 01/26/21 1249       Right Lower Extremity Lymphedema   Other RLE A-D voume = 8220.0 ml. RLE E-G vol = 7921.2 ml. RLE A-G volume = 16141.2 ml.    Other LVD for A=D= 7.63%, R>L, for E-G= 12.1%, R>L, and for  full limb A-G= 9.76%, R>L.      Left Lower Extremity Lymphedema   Other LLE A-D voume = 7637.0 ml. LLE E-G vol = 7068.0 ml. LLE A-G volume = 14705.0 ml. ml.                     OT Treatments/Exercises (OP) - 01/26/21 1248       Manual Therapy   Manual Therapy Edema management;Compression Bandaging    Compression Bandaging multi layer wraps to LLE from base of toes to tibial tuberosity using 1 each 8, 10 and 12 cm short stretch wraps over single layer Rosidal foam and cotton stockinett.                    OT Education - 01/26/21 1253     Education Details Emphasis of LE self-care training today on Pt and family edu for initial volumetrics comparison and on gradient compression wrap application at home between sessions.    Person(s) Educated Patient    Methods Explanation;Demonstration;Handout     Comprehension Verbalized understanding;Need further instruction                 OT Long Term Goals - 10/09/20 1110       OT LONG TERM GOAL #1   Title Given this patient's risk-adjustment variables, her Intake Functional Status score of 68/100 on the FOTO tool, patient will experience at least an increase in function of 3 points for a score of 71/100, or higher.    Baseline 68/100    Time 12    Period Weeks    Status New    Target Date 01/07/21      OT LONG TERM GOAL #2   Title Pt will verbalize 4 lymphedema precautions and prevention strategies using printed reference (modified independence) to reduce infection risk limit progression of lymphedema.    Baseline Max A    Period Days    Status New    Target Date --   4th OT Rx visit     OT LONG TERM GOAL #3   Title Pt will be able to apply knee length, multi-layer, short stretch compression wraps to one leg at a time using correct gradient techniques with extra time (modified independence) to decrease limb volume, reduce infection risk and limit progression of lymphedema.    Baseline dependent    Time 4    Period Days    Status New    Target Date --   4th OT Rx visit     OT LONG TERM GOAL #4   Title Pt will achieve at least 10% limb volume reduction bilaterally during Intensive Phase CDT to prevent re-accumulation of lymphatic congestion and progression of fibrosis, to limit infection risk, to improve functional ambulation and transfers, and to improve functional performance of ADLs.    Baseline Max A    Time 12    Period Weeks    Status New    Target Date 01/07/21      OT LONG TERM GOAL #5   Title Pt will achieve and sustain a least 85% compliance with all LE self-care home program components throughout Intensive Phase CDT, including elevation when seated, recommended skin care regime, lymphatic pumping ther ex, 23/7 compression wraps and simple self-MLD.    Baseline Dependent    Time 12    Period Weeks    Status New     Target Date 01/07/21      Long Term Additional Goals  Additional Long Term Goals Yes      OT LONG TERM GOAL #6   Title By DC from OT Pt will be able to don and doff appropriate daytime compression garments using assistive devices to limit lymphatic re-accumulation and LE progression with before transitioning to self-management phase of CDT.    Baseline max A    Time 12    Period Weeks    Status New    Target Date 01/07/21      OT LONG TERM GOAL #7   Title Pt will achieve maximum lymphedema reduction to enable functional improvements including fitting preferred LB clothing and street shoes, and to improve body image for optimal social participation.    Baseline Max A    Time 12    Period Weeks    Status New    Target Date 01/07/21                   Plan - 01/26/21 1103     Clinical Impression Statement Initial BLE limb volumetrics revealRLE (dominant) is larger in volume for the leg with limb volume differential (LVD) measuring  7.63%), R>L. thigh LVD measuring 12.1%, R>L, and full limb LVD measuring 9.76 %, R>L. Remainder of session devotoed to Pt edu for short stretch compression wrapping using gradient technique. Pt opted not to leave wraps in place at end of session since she did not bring recommended footwear. Next visit Pt will commence hands on learning to apply wraps. Cont as per POC.    OT Occupational Profile and History Comprehensive Assessment- Review of records and extensive additional review of physical, cognitive, psychosocial history related to current functional performance    Occupational performance deficits (Please refer to evaluation for details): ADL's;Other   impaired body image 2/2 feet and leg swelling   Body Structure / Function / Physical Skills ADL;Edema;Decreased knowledge of precautions;Decreased knowledge of use of DME;Skin integrity   body image   Rehab Potential Good    Clinical Decision Making Limited treatment options, no task modification  necessary    Comorbidities Affecting Occupational Performance: Presence of comorbidities impacting occupational performance    Modification or Assistance to Complete Evaluation  Min-Moderate modification of tasks or assist with assess necessary to complete eval    OT Frequency 2x / week    OT Duration 12 weeks    OT Treatment/Interventions Self-care/ADL training;Therapeutic exercise;Manual Therapy;Therapeutic activities;Manual lymph drainage;DME and/or AE instruction;Compression bandaging;Coping strategies training;Patient/family education    Plan Intensive and Self-Management Phases of Complete Decongestive Therapy (CDT) : Manual Lymphatic Drainage (MLD, skin care, compression bandaging and garments, skin carether ex    OT Home Exercise Plan emphasis of CDT on swelling reduction and teaching LE self management    Recommended Other Services Consider fitting with Flexitouch advanced sequential compression device , or "pump". The advanced Flexitouch [id number] pneumatic compression device with BLE garments and truncal garment is medically necessary to assist the patient with long term, daily, lymphedema self-care at home. The advanced, 32-chamber Flexitouch is the only available sequential pneumatic compression device providing proximal to distal lymphatic fluid return via regional lymph nodes, deep abdominal pathways and the thoracic duct to the heart.  The Flexitouch provides proximal treatment preceded by proximal clearing of lymphatic fluid in the trunk, thigh, calf and foot. The truncal garment extends fluid return past the functional inguinal lymph nodes into the core, deep abdominal lymphatics to the thoracic duct. The basic pneumatic pump is not appropriate for this patient because it provides distal-to-proximal, retrograde  massage mobilizing tissue fluid against back pressure which then abruptly ends before reaching regional LNs leaving dense, protein rich fluid distal to the lymph nodes at joints.     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   Other fatigue 01/17/2018   Shortness of breath on exertion 01/17/2018   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 [place], [place], [address] Phone: [phone]   Fax:  [phone]  Name: Tangela Dolliver MRN: [id number] Date of Birth: February 23, 1956

## 2021-01-26 NOTE — Patient Instructions (Signed)

## 2021-01-28 ENCOUNTER — Ambulatory Visit: Payer: BC Managed Care – PPO | Admitting: Occupational Therapy

## 2021-02-02 ENCOUNTER — Ambulatory Visit: Payer: BC Managed Care – PPO | Admitting: Occupational Therapy

## 2021-02-03 ENCOUNTER — Ambulatory Visit: Payer: BC Managed Care – PPO | Admitting: Occupational Therapy

## 2021-02-15 ENCOUNTER — Other Ambulatory Visit: Payer: Self-pay

## 2021-02-15 ENCOUNTER — Ambulatory Visit: Payer: BC Managed Care – PPO | Attending: Family Medicine | Admitting: Occupational Therapy

## 2021-02-15 DIAGNOSIS — I890 Lymphedema, not elsewhere classified: Secondary | ICD-10-CM | POA: Diagnosis present

## 2021-02-16 NOTE — Therapy (Signed)
[place] [place] [facility] MAIN [facility] SERVICES [address] [place], [place], [address] Phone: [phone]   Fax:  [phone]  Occupational Therapy Treatment  Patient Details  Name: Laura [name] MRN: [id number] Date of Birth: 04/06/1955 Referring Provider (OT): Laurann Montana, MD   Encounter Date: 02/15/2021   OT End of Session - 02/15/21 1023     Visit Number 3    Number of Visits 36    Date for OT Re-Evaluation 04/26/21    OT Start Time 0310    OT Stop Time 0351    OT Time Calculation (min) 41 min    Activity Tolerance Patient tolerated treatment well;No increased pain    Behavior During Therapy [facility] for tasks assessed/performed             Past Medical History:  Diagnosis Date   Anemia, normocytic normochromic 07/04/2017   Chronic Hb 10 +/- 0.5 gms   Arthritis    Asthma    exercise induced    Edema    Frozen, subsequent encounter 02/2013   Diaphragm   Herpes    High blood pressure    History of blood clots    Obesity    Partial tear of Achilles tendon    Pneumonia    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   IgG 1,919 mg %; normal IgM & IgA 04/25/16; no monoclonal protein on IFE   Pre-diabetes    Pulmonary embolism (HCC)    Pulmonary embolus (HCC) 05/30/2011   July, 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   Subjective Assessment - 02/15/21 1047     Subjective  Vickee Mormino presents for OT Rx  visit 3/36 to address BLE/BLQ lipo-lymphedema. Ms. Demedeiros states she would like to modify her plan of care as getting to [facility] OT sessions from her home near [place] while working has become more and more difficult. She states she would like to undergo CDT as planned, but would like to postpone until after her retirement in 2023.    Pertinent History Onset chronic leg swelling in 40's; HTN, OA, s/p TKA x 2: L 2019, R 2022; Hx PE, Obesity    Limitations impaired body image- disguises leg swelling. Difficulty fitting LB clothing and street shoes.    Repetition Increases Symptoms    Special Tests + Stemmer; Intake FOTO 68/100    Patient Stated Goals reduce swelling and keep it from getting worse, increased infection risk                          OT Treatments/Exercises (OP) - 02/16/21 1050       ADLs   ADL Education Given Yes      Manual Therapy   Manual Therapy Edema management  OT Education - 02/15/21 1050     Education Details Continued skilled Pt/caregiver education  And LE ADL training throughout visit for lymphedema self care/ home program, including compression wrapping, compression garment and device wear/care, lymphatic pumping ther ex, simple self-MLD, and skin care. Discussed initial volumetric measurements. Discussed all long term goals.    Person(s) Educated Patient    Methods Explanation;Demonstration;Handout    Comprehension Verbalized understanding;Need further instruction                 OT Long Term Goals - 10/09/20 1110       OT LONG TERM GOAL #1   Title Given this patient's risk-adjustment variables, her Intake Functional Status score of 68/100 on the FOTO tool, patient will experience at least an increase in function of 3 points for a score of 71/100, or higher.    Baseline 68/100    Time 12    Period Weeks    Status New    Target Date 01/07/21      OT LONG TERM GOAL #2   Title Pt will verbalize 4  lymphedema precautions and prevention strategies using printed reference (modified independence) to reduce infection risk limit progression of lymphedema.    Baseline Max A    Period Days    Status New    Target Date --   4th OT Rx visit     OT LONG TERM GOAL #3   Title Pt will be able to apply knee length, multi-layer, short stretch compression wraps to one leg at a time using correct gradient techniques with extra time (modified independence) to decrease limb volume, reduce infection risk and limit progression of lymphedema.    Baseline dependent    Time 4    Period Days    Status New    Target Date --   4th OT Rx visit     OT LONG TERM GOAL #4   Title Pt will achieve at least 10% limb volume reduction bilaterally during Intensive Phase CDT to prevent re-accumulation of lymphatic congestion and progression of fibrosis, to limit infection risk, to improve functional ambulation and transfers, and to improve functional performance of ADLs.    Baseline Max A    Time 12    Period Weeks    Status New    Target Date 01/07/21      OT LONG TERM GOAL #5   Title Pt will achieve and sustain a least 85% compliance with all LE self-care home program components throughout Intensive Phase CDT, including elevation when seated, recommended skin care regime, lymphatic pumping ther ex, 23/7 compression wraps and simple self-MLD.    Baseline Dependent    Time 12    Period Weeks    Status New    Target Date 01/07/21      Long Term Additional Goals   Additional Long Term Goals Yes      OT LONG TERM GOAL #6   Title By DC from OT Pt will be able to don and doff appropriate daytime compression garments using assistive devices to limit lymphatic re-accumulation and LE progression with before transitioning to self-management phase of CDT.    Baseline max A    Time 12    Period Weeks    Status New    Target Date 01/07/21      OT LONG TERM GOAL #7   Title Pt will achieve maximum lymphedema reduction to  enable functional improvements including fitting preferred LB clothing and street shoes, and to  improve body image for optimal social participation.    Baseline Max A    Time 12    Period Weeks    Status New    Target Date 01/07/21                   Plan - 02/16/21 1052     Clinical Impression Statement Pt requesting change in OT plan of lymphedema care due to time constraints related to work schedule, OT frequency and commute distance ot clinic. Pt and OT agree to postpone Intensive Phase CDT until Pt retires from her job in 2023. In the meantime we'll proceed with Flexitouch trial as Pt will benefit from using this device for  LE self care at home during the Rx interval. Trial scheduled with manufacturer's rep on 12/7 at 2 PM. After trial DC OT and resume when logistics are better for Pt.    OT Occupational Profile and History Comprehensive Assessment- Review of records and extensive additional review of physical, cognitive, psychosocial history related to current functional performance    Occupational performance deficits (Please refer to evaluation for details): ADL's;Other   impaired body image 2/2 feet and leg swelling   Body Structure / Function / Physical Skills ADL;Edema;Decreased knowledge of precautions;Decreased knowledge of use of DME;Skin integrity   body image   Rehab Potential Good    Clinical Decision Making Limited treatment options, no task modification necessary    Comorbidities Affecting Occupational Performance: Presence of comorbidities impacting occupational performance    Modification or Assistance to Complete Evaluation  Min-Moderate modification of tasks or assist with assess necessary to complete eval    OT Frequency 2x / week    OT Duration 12 weeks    OT Treatment/Interventions Self-care/ADL training;Therapeutic exercise;Manual Therapy;Therapeutic activities;Manual lymph drainage;DME and/or AE instruction;Compression bandaging;Coping strategies  training;Patient/family education    Plan Intensive and Self-Management Phases of Complete Decongestive Therapy (CDT) : Manual Lymphatic Drainage (MLD, skin care, compression bandaging and garments, skin carether ex    OT Home Exercise Plan emphasis of CDT on swelling reduction and teaching LE self management    Recommended Other Services Consider fitting with Flexitouch advanced sequential compression device , or "pump". The advanced Flexitouch [phone] pneumatic compression device with BLE garments and truncal garment is medically necessary to assist the patient with long term, daily, lymphedema self-care at home. The advanced, 32-chamber Flexitouch is the only available sequential pneumatic compression device providing proximal to distal lymphatic fluid return via regional lymph nodes, deep abdominal pathways and the thoracic duct to the heart.  The Flexitouch provides proximal treatment preceded by proximal clearing of lymphatic fluid in the trunk, thigh, calf and foot. The truncal garment extends fluid return past the functional inguinal lymph nodes into the core, deep abdominal lymphatics to the thoracic duct. The basic pneumatic pump is not appropriate for this patient because it provides distal-to-proximal, retrograde massage mobilizing tissue fluid against back pressure which then abruptly ends before reaching regional LNs leaving dense, protein rich fluid distal to the lymph nodes at joints.    Consulted and Agree with Plan of Care Patient             Patient will benefit from skilled therapeutic intervention in order to improve the following deficits and impairments:   Body Structure / Function / Physical Skills: ADL, Edema, Decreased knowledge of precautions, Decreased knowledge of use of DME, Skin integrity (body image)       Visit Diagnosis: Lymphedema, not elsewhere classified  Problem List Patient Active Problem List   Diagnosis Date Noted   Primary osteoarthritis of right  knee 05/11/2020   Primary osteoarthritis of left shoulder 03/20/2019   Other fatigue 01/17/2018   Shortness of breath on exertion 01/17/2018   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   Anemia, normocytic normochromic 07/04/2017   OA (osteoarthritis) of knee 09/05/2016   Morbid obesity (HCC) 11/26/2014   Right Achilles tendinitis 08/21/2014   Pulmonary embolus (HCC) 05/30/2011   Coagulopathy (HCC) 05/30/2011   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

## 2021-02-17 ENCOUNTER — Other Ambulatory Visit: Payer: Self-pay

## 2021-02-17 ENCOUNTER — Ambulatory Visit: Payer: BC Managed Care – PPO | Admitting: Occupational Therapy

## 2021-02-17 DIAGNOSIS — I890 Lymphedema, not elsewhere classified: Secondary | ICD-10-CM | POA: Diagnosis not present

## 2021-02-17 NOTE — Therapy (Signed)
[place] MAIN [facility] SERVICES [address] [place], [place], [address] Phone: [phone]   Fax:  [phone]  Occupational Therapy Treatment  Patient Details  Name: Laura [name] MRN: [id number] Date of Birth: 09/29/1955 Referring Provider (OT): Harlan Stains, MD   Encounter Date: 02/17/2021   OT End of Session - 02/17/21 1417     Visit Number 4    Number of Visits 36    Date for OT Re-Evaluation 04/26/21    OT Start Time 0200    OT Stop Time 0300    OT Time Calculation (min) 60 min    Activity Tolerance Patient tolerated treatment well;No increased pain    Behavior During Therapy [facility] for tasks assessed/performed             Past Medical History:  Diagnosis Date   Anemia, normocytic normochromic 07/04/2017   Chronic Hb 10 +/- 0.5 gms   Arthritis    Asthma    exercise induced    Edema    Frozen, subsequent encounter 02/2013   Diaphragm   Herpes    High blood pressure    History of blood clots    Obesity    Partial tear of Achilles tendon    Pneumonia    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   IgG 1,919 mg %; normal IgM & IgA 04/25/16; no monoclonal protein on IFE   Pre-diabetes    Pulmonary embolism ([place])    Pulmonary embolus ([place]) 05/30/2011   July, 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 Difficulty fitting LB clothing and street shoes.    Repetition Increases Symptoms    Special Tests + [place]; Intake FOTO 68/100    Patient Stated Goals reduce swelling and keep it from getting worse, increased infection risk    Currently in Pain? Yes    Pain Location Leg    Pain Orientation Right;Left    Pain Descriptors / Indicators Heaviness;Tiring;Tightness;Sore   fullness                         OT Treatments/Exercises (OP) - 02/17/21 1423       ADLs   ADL Education Given Yes      Manual Therapy   Manual Therapy Edema management    Edema Management Flexitouch trial- 1 hr 30-50 mmHg  OT Education - 02/17/21 1433     Education Details Pt/ family education for basic and advanced sequential pneumatic compression devices, or "pump", including clinical indications, how thy work, how the basic pneumatic "pump" differs from the advanced Flexitouch, indications, contraindications and precaution, s and care and use routines for pneumatic compression devices. Patient's and spouse's questions were answered throughout trial and handouts and Internet resources given for reference.    Person(s) Educated Patient    Methods Explanation;Demonstration;Handout    Comprehension Verbalized understanding;Need further instruction                 OT Long Term Goals - 10/09/20 1110       OT LONG TERM GOAL #1   Title Given this patient's risk-adjustment variables, her Intake Functional Status score of 68/100 on the FOTO tool, patient will experience at least an increase in function of 3 points for a score  of 71/100, or higher.    Baseline 68/100    Time 12    Period Weeks    Status New    Target Date 01/07/21      OT LONG TERM GOAL #2   Title Pt will verbalize 4 lymphedema precautions and prevention strategies using printed reference (modified independence) to reduce infection risk limit progression of lymphedema.    Baseline Max A    Period Days    Status New    Target Date --   4th OT Rx visit     OT LONG TERM GOAL #3   Title Pt will be able to apply knee length, multi-layer, short stretch compression wraps to one leg at a time using correct gradient techniques with extra time (modified independence) to decrease limb volume, reduce infection risk and limit progression of lymphedema.    Baseline dependent    Time 4    Period Days    Status New    Target Date --   4th OT Rx visit     OT LONG TERM GOAL #4   Title Pt will achieve at least 10% limb volume reduction bilaterally during Intensive Phase CDT to prevent re-accumulation of lymphatic congestion and progression of fibrosis, to limit infection risk, to improve functional ambulation and transfers, and to improve functional performance of ADLs.    Baseline Max A    Time 12    Period Weeks    Status New    Target Date 01/07/21      OT LONG TERM GOAL #5   Title Pt will achieve and sustain a least 85% compliance with all LE self-care home program components throughout Intensive Phase CDT, including elevation when seated, recommended skin care regime, lymphatic pumping ther ex, 23/7 compression wraps and simple self-MLD.    Baseline Dependent    Time 12    Period Weeks    Status New    Target Date 01/07/21      Long Term Additional Goals   Additional Long Term Goals Yes      OT LONG TERM GOAL #6   Title By DC from OT Pt will be able to don and doff appropriate daytime compression garments using assistive devices to limit lymphatic re-accumulation and LE progression with before transitioning to self-management phase of CDT.     Baseline max A    Time 12    Period Weeks    Status New    Target Date 01/07/21      OT LONG TERM GOAL #7   Title Pt will achieve maximum  lymphedema reduction to enable functional improvements including fitting preferred LB clothing and street shoes, and to improve body image for optimal social participation.    Baseline Max A    Time 12    Period Weeks    Status New    Target Date 01/07/21                   Plan - 02/17/21 1435     Clinical Impression Statement Laura [name] presents with mild, stage II, lipo-lymphedema. She completed a 1hour trial with the advanced Flexitouch Plus pneumatic compression device [id number]) using 30-50 mmHg compression on the LLE/LLQ without increased pain or SOB. This advanced device with BLE garments and truncal garment is medically necessary to assist Laura [name] with optimal daily lymphedema self-care over time at home. The advanced, 32-chamber Flexitouch is the only available sequential pneumatic compression device that provides proximal to distal fluid return via regional lymph nodes, deep abdominal pathways and the thoracic duct to the heart. The basic pneumatic pump is not appropriate for this patient because it provides distal-to-proximal, retrograde massage mobilizing tissue fluid against back abruptly ending distal to the regional lymph nodes in the groin leaving a dense ring of protein rich fluid distal to the inguinal lymph nodes in the thigh. The Flexitouch Plus [phone] Advanced Pneumatic Compression Device is medically necessary to treat this patient's symptoms of BLE, thigh, and abdominal and lymphedema.    OT Occupational Profile and History Comprehensive Assessment- Review of records and extensive additional review of physical, cognitive, psychosocial history related to current functional performance    Occupational performance deficits (Please refer to evaluation for details): ADL's;Other   impaired body image 2/2 feet and leg swelling    Body Structure / Function / Physical Skills ADL;Edema;Decreased knowledge of precautions;Decreased knowledge of use of DME;Skin integrity   body image   Rehab Potential Good    Clinical Decision Making Several treatment options, min-mod task modification necessary    Comorbidities Affecting Occupational Performance: Presence of comorbidities impacting occupational performance    Modification or Assistance to Complete Evaluation  Min-Moderate modification of tasks or assist with assess necessary to complete eval    OT Frequency 2x / week    OT Duration 12 weeks    OT Treatment/Interventions Self-care/ADL training;Therapeutic exercise;Manual Therapy;Therapeutic activities;Manual lymph drainage;DME and/or AE instruction;Compression bandaging;Coping strategies training;Patient/family education    Plan Intensive and Self-Management Phases of Complete Decongestive Therapy (CDT) : Manual Lymphatic Drainage (MLD, skin care, compression bandaging and garments, skin carether ex    OT Home Exercise Plan emphasis of CDT on swelling reduction and teaching LE self management    Recommended Other Services Consider fitting with Flexitouch advanced sequential compression device , or "pump". The advanced Flexitouch [phone] pneumatic compression device with BLE garments and truncal garment is medically necessary to assist the patient with long term, daily, lymphedema self-care at home. The advanced, 32-chamber Flexitouch is the only available sequential pneumatic compression device providing proximal to distal lymphatic fluid return via regional lymph nodes, deep abdominal pathways and the thoracic duct to the heart.  The Flexitouch provides proximal treatment preceded by proximal clearing of lymphatic fluid in the trunk, thigh, calf and foot. The truncal garment extends fluid return past the functional inguinal lymph nodes into the core, deep abdominal lymphatics to the thoracic duct. The basic pneumatic pump is not  appropriate for this patient because it provides distal-to-proximal, retrograde massage mobilizing tissue fluid against back pressure which then abruptly ends before reaching regional LNs  leaving dense, protein rich fluid distal to the lymph nodes at joints.    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   Other fatigue 01/17/2018   Shortness of breath on exertion 01/17/2018   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   Anemia, normocytic normochromic 07/04/2017   OA (osteoarthritis) of knee 09/05/2016   Morbid obesity ([place]) 11/26/2014   Right Achilles tendinitis 08/21/2014   Pulmonary embolus ([place]) 05/30/2011   Coagulopathy ([place]) 05/30/2011   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

## 2021-02-17 NOTE — Patient Instructions (Signed)

## 2021-02-19 ENCOUNTER — Encounter: Payer: BC Managed Care – PPO | Admitting: Occupational Therapy

## 2021-02-22 ENCOUNTER — Ambulatory Visit: Payer: BC Managed Care – PPO | Admitting: Occupational Therapy

## 2021-02-24 ENCOUNTER — Encounter: Payer: BC Managed Care – PPO | Admitting: Occupational Therapy

## 2021-02-26 ENCOUNTER — Encounter: Payer: BC Managed Care – PPO | Admitting: Occupational Therapy

## 2021-03-02 ENCOUNTER — Encounter: Payer: BC Managed Care – PPO | Admitting: Occupational Therapy

## 2021-03-04 ENCOUNTER — Encounter: Payer: BC Managed Care – PPO | Admitting: Occupational Therapy

## 2021-03-18 ENCOUNTER — Encounter: Payer: BC Managed Care – PPO | Admitting: Occupational Therapy

## 2021-03-23 ENCOUNTER — Encounter: Payer: BC Managed Care – PPO | Admitting: Occupational Therapy

## 2021-03-25 ENCOUNTER — Encounter: Payer: BC Managed Care – PPO | Admitting: Occupational Therapy

## 2021-03-30 ENCOUNTER — Encounter: Payer: BC Managed Care – PPO | Admitting: Occupational Therapy

## 2021-04-01 ENCOUNTER — Encounter: Payer: BC Managed Care – PPO | Admitting: Occupational Therapy

## 2021-04-01 ENCOUNTER — Other Ambulatory Visit: Payer: Self-pay

## 2021-04-01 ENCOUNTER — Emergency Department (HOSPITAL_COMMUNITY): Payer: BC Managed Care – PPO

## 2021-04-01 ENCOUNTER — Emergency Department (HOSPITAL_COMMUNITY)
Admission: EM | Admit: 2021-04-01 | Discharge: 2021-04-01 | Disposition: A | Discharge status: Home / Self Care | Payer: BC Managed Care – PPO | Attending: Emergency Medicine | Admitting: Emergency Medicine

## 2021-04-01 ENCOUNTER — Encounter (HOSPITAL_COMMUNITY): Payer: Self-pay | Admitting: Emergency Medicine

## 2021-04-01 DIAGNOSIS — S0990XA Unspecified injury of head, initial encounter: Secondary | ICD-10-CM | POA: Diagnosis present

## 2021-04-01 DIAGNOSIS — Y92410 Unspecified street and highway as the place of occurrence of the external cause: Secondary | ICD-10-CM | POA: Insufficient documentation

## 2021-04-01 DIAGNOSIS — Z79899 Other long term (current) drug therapy: Secondary | ICD-10-CM | POA: Insufficient documentation

## 2021-04-01 DIAGNOSIS — Z7901 Long term (current) use of anticoagulants: Secondary | ICD-10-CM | POA: Insufficient documentation

## 2021-04-01 DIAGNOSIS — S50811A Abrasion of right forearm, initial encounter: Secondary | ICD-10-CM | POA: Insufficient documentation

## 2021-04-01 DIAGNOSIS — I10 Essential (primary) hypertension: Secondary | ICD-10-CM | POA: Insufficient documentation

## 2021-04-01 MED ORDER — METHOCARBAMOL 500 MG PO TABS: 500.0000 mg | ORAL_TABLET | Freq: Two times a day (BID) | ORAL | 0 refills | Status: DC
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
## 2021-04-06 ENCOUNTER — Encounter: Payer: BC Managed Care – PPO | Admitting: Occupational Therapy

## 2021-04-06 ENCOUNTER — Other Ambulatory Visit: Payer: Self-pay | Admitting: Family Medicine

## 2021-04-06 DIAGNOSIS — E041 Nontoxic single thyroid nodule: Secondary | ICD-10-CM

## 2021-04-08 ENCOUNTER — Encounter: Payer: BC Managed Care – PPO | Admitting: Occupational Therapy

## 2021-04-13 ENCOUNTER — Encounter: Payer: BC Managed Care – PPO | Admitting: Occupational Therapy

## 2021-04-15 ENCOUNTER — Other Ambulatory Visit: Payer: BC Managed Care – PPO

## 2021-04-15 ENCOUNTER — Encounter: Payer: BC Managed Care – PPO | Admitting: Occupational Therapy

## 2021-05-03 ENCOUNTER — Other Ambulatory Visit: Payer: BC Managed Care – PPO

## 2021-05-10 ENCOUNTER — Ambulatory Visit
Admission: RE | Admit: 2021-05-10 | Discharge: 2021-05-10 | Disposition: A | Discharge status: Home / Self Care | Payer: BC Managed Care – PPO | Source: Ambulatory Visit | Attending: Family Medicine | Admitting: Family Medicine

## 2021-05-10 DIAGNOSIS — E041 Nontoxic single thyroid nodule: Secondary | ICD-10-CM

## 2021-05-13 ENCOUNTER — Other Ambulatory Visit: Payer: Self-pay | Admitting: Family Medicine

## 2021-05-13 DIAGNOSIS — E041 Nontoxic single thyroid nodule: Secondary | ICD-10-CM

## 2021-05-18 ENCOUNTER — Other Ambulatory Visit: Payer: Self-pay | Admitting: Family Medicine

## 2021-05-18 DIAGNOSIS — Z1231 Encounter for screening mammogram for malignant neoplasm of breast: Secondary | ICD-10-CM

## 2021-05-20 ENCOUNTER — Ambulatory Visit: Payer: BC Managed Care – PPO

## 2021-05-25 ENCOUNTER — Ambulatory Visit
Admission: RE | Admit: 2021-05-25 | Discharge: 2021-05-25 | Disposition: A | Discharge status: Home / Self Care | Payer: BC Managed Care – PPO | Source: Ambulatory Visit | Attending: Family Medicine | Admitting: Family Medicine

## 2021-05-25 DIAGNOSIS — Z1231 Encounter for screening mammogram for malignant neoplasm of breast: Secondary | ICD-10-CM

## 2021-06-09 ENCOUNTER — Other Ambulatory Visit: Payer: BC Managed Care – PPO

## 2021-06-10 ENCOUNTER — Ambulatory Visit
Admission: RE | Admit: 2021-06-10 | Discharge: 2021-06-10 | Disposition: A | Discharge status: Home / Self Care | Payer: BC Managed Care – PPO | Source: Ambulatory Visit | Attending: Family Medicine | Admitting: Family Medicine

## 2021-06-10 ENCOUNTER — Other Ambulatory Visit (HOSPITAL_COMMUNITY)
Admission: RE | Admit: 2021-06-10 | Discharge: 2021-06-10 | Disposition: A | Discharge status: Home / Self Care | Payer: BC Managed Care – PPO | Source: Ambulatory Visit | Attending: Family Medicine | Admitting: Family Medicine

## 2021-06-10 DIAGNOSIS — E041 Nontoxic single thyroid nodule: Secondary | ICD-10-CM | POA: Insufficient documentation

## 2021-06-11 LAB — CYTOLOGY - NON PAP

## 2021-10-13 ENCOUNTER — Ambulatory Visit: Payer: BC Managed Care – PPO | Admitting: Family Medicine

## 2021-10-13 ENCOUNTER — Ambulatory Visit: Payer: Self-pay

## 2021-10-13 ENCOUNTER — Encounter: Payer: Self-pay | Admitting: Family Medicine

## 2021-10-13 VITALS — BP 102/71 | Ht 66.5 in | Wt 240.0 lb

## 2021-10-13 DIAGNOSIS — M19012 Primary osteoarthritis, left shoulder: Secondary | ICD-10-CM | POA: Diagnosis not present

## 2021-10-13 MED ORDER — TRIAMCINOLONE ACETONIDE 40 MG/ML IJ SUSP
40.0000 mg | Freq: Once | INTRAMUSCULAR | Status: AC
  Administered 2021-10-13: 40 mg via INTRA_ARTICULAR

## 2021-10-13 NOTE — Addendum Note (Signed)
Addended by: Merrilyn Puma on: 10/13/2021 01:30 PM   Modules accepted: Orders

## 2021-10-13 NOTE — Assessment & Plan Note (Signed)
Acute on chronic in nature.  Effusion appreciated on exam. -Counseled on home exercise therapy and supportive care. -Injection today. -Could consider physical therapy or further imaging.

## 2021-10-13 NOTE — Patient Instructions (Signed)
Good to see you Please use ice as needed  Please send me a message in MyChart with any questions or updates.  Please see me back as needed.   --Dr. Marylene Masek  

## 2021-10-13 NOTE — Progress Notes (Signed)
  Laura [name] - 66 y.o. female MRN [id number]  Date of birth: 23-Mar-1955  SUBJECTIVE:  Including CC & ROS.  No chief complaint on file.   Laura [name] is a 66 y.o. female that is presenting with acute on chronic left shoulder pain.  No injury in the interim.  The pain is similar to her previous pain.  She feels it mostly at night.   Review of Systems See HPI   HISTORY: Past Medical, Surgical, Social, and Family History Reviewed & Updated per EMR.   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   Vitamin D deficiency     Past Surgical History:  Procedure Laterality Date   BREAST BIOPSY     breast biopsy    CESAREAN SECTION     1993 and 1997   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   Procedure: LEFT TOTAL KNEE ARTHROPLASTY;  Surgeon: Ollen Gross, MD;  Location: [facility];  Service: Orthopedics;  Laterality: Left;   TOTAL KNEE ARTHROPLASTY Right 05/11/2020   Procedure: TOTAL KNEE ARTHROPLASTY;  Surgeon: Ollen Gross, MD;  Location: [facility];  Service: Orthopedics;  Laterality: Right;      PHYSICAL EXAM:  VS: BP 102/71 (BP Location: Left Arm, Patient Position: Sitting)   Ht 5' 6.5" (1.689 m)   Wt 240 lb (108.9 kg)   BMI 38.16 kg/m  Physical Exam Gen: NAD, alert, cooperative with exam, well-appearing MSK:  Neurovascularly intact     Aspiration/Injection Procedure Note Laura [name] 23-Dec-1955  Procedure: Injection Indications: left shoulder pain  Procedure Details Consent: Risks of procedure as well as the alternatives and risks of each were explained to the (patient/caregiver).  Consent for procedure obtained. Time Out: Verified patient identification, verified procedure, site/side was marked, verified correct patient position, special equipment/implants available, medications/allergies/relevent history reviewed, required imaging and test results available.  Performed.  The area was cleaned with iodine and alcohol swabs.    The left glenohumeral joint was injected using 3 cc of 1% lidocaine on a 22-gauge 3-1/2 inch needle.  The syringe was switched to mixture containing 1 cc's of 40 mg Kenalog and 4 cc's of 0.25% bupivacaine was injected.  Ultrasound was used. Images were obtained in short views showing the injection.     A sterile dressing was applied.  Patient did tolerate procedure well.     ASSESSMENT & PLAN:   Primary osteoarthritis of left shoulder Acute on chronic in nature.  Effusion appreciated on exam. -Counseled on home exercise therapy and supportive care. -Injection today. -Could consider physical therapy or further imaging.

## 2021-10-20 ENCOUNTER — Encounter (INDEPENDENT_AMBULATORY_CARE_PROVIDER_SITE_OTHER): Payer: Self-pay

## 2022-03-18 ENCOUNTER — Ambulatory Visit: Payer: Self-pay

## 2022-03-18 ENCOUNTER — Ambulatory Visit: Payer: BC Managed Care – PPO | Admitting: Family Medicine

## 2022-03-18 ENCOUNTER — Encounter: Payer: Self-pay | Admitting: Family Medicine

## 2022-03-18 VITALS — BP 120/82 | Ht 67.0 in | Wt 240.0 lb

## 2022-03-18 DIAGNOSIS — M19012 Primary osteoarthritis, left shoulder: Secondary | ICD-10-CM

## 2022-03-18 DIAGNOSIS — M25851 Other specified joint disorders, right hip: Secondary | ICD-10-CM | POA: Insufficient documentation

## 2022-03-18 DIAGNOSIS — M169 Osteoarthritis of hip, unspecified: Secondary | ICD-10-CM | POA: Insufficient documentation

## 2022-03-18 MED ORDER — TRIAMCINOLONE ACETONIDE 40 MG/ML IJ SUSP
40.0000 mg | Freq: Once | INTRAMUSCULAR | Status: AC
  Administered 2022-03-18: 40 mg via INTRA_ARTICULAR

## 2022-03-18 NOTE — Patient Instructions (Signed)
Good to see you Please alternate heat and ice on the hip  Please try the exercises  Please try adjusting to cycling or swimming   Please send me a message in MyChart with any questions or updates.  Please see me back in 4-6 weeks.   --Dr. Raeford Razor

## 2022-03-18 NOTE — Progress Notes (Signed)
  Laura [name] - 67 y.o. female MRN [id number]  Date of birth: 1956-02-08  SUBJECTIVE:  Including CC & ROS.  No chief complaint on file.   Laura [name] is a 67 y.o. female that is  presenting with acute right hip pain and acute on chronic left shoulder pain.    Review of Systems See HPI   HISTORY: Past Medical, Surgical, Social, and Family History Reviewed & Updated per EMR.   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   Vitamin D deficiency     Past Surgical History:  Procedure Laterality Date   BREAST BIOPSY     breast biopsy    CESAREAN SECTION     1993 and [place] Left 09/05/2016   Procedure: LEFT TOTAL KNEE ARTHROPLASTY;  Surgeon: Gaynelle Arabian, MD;  Location: [facility];  Service: Orthopedics;  Laterality: Left;   TOTAL KNEE ARTHROPLASTY Right 05/11/2020   Procedure: TOTAL KNEE ARTHROPLASTY;  Surgeon: Gaynelle Arabian, MD;  Location: [facility];  Service: Orthopedics;  Laterality: Right;  93min     PHYSICAL EXAM:  VS: BP 120/82   Ht 5\' 7"  (1.702 m)   Wt 240 lb (108.9 kg)   BMI 37.59 kg/m  Physical Exam Gen: NAD, alert, cooperative with exam, well-appearing MSK:  Neurovascularly intact     Aspiration/Injection Procedure Note Laura [name] December 18, 1955  Procedure: Injection Indications: left shoulder pain  Procedure Details Consent: Risks of procedure as well as the  alternatives and risks of each were explained to the (patient/caregiver).  Consent for procedure obtained. Time Out: Verified patient identification, verified procedure, site/side was marked, verified correct patient position, special equipment/implants available, medications/allergies/relevent history reviewed, required imaging and test results available.  Performed.  The area was cleaned with iodine and alcohol swabs.    The left glenohumeral joint was injected with 3 cc of 1% lidocaine on a 22-gauge 3-1/2 inch needle.  The syringe was switched and a mixture containing 1 cc of 40 mg Kenalog and 4 cc of 0.25% bupivacaine was injected.  Ultrasound was used. Images were obtained in short views showing the injection.     A sterile dressing was applied.  Patient did tolerate procedure well.     ASSESSMENT & PLAN:   Hip impingement syndrome, right Acutely occurring.  Having pain in the groin.  She has been walking more over the past month.  Notices the pain more with getting up from a seated position. -Counseled on home exercise therapy and supportive care. -Could consider physical therapy or further imaging  Primary osteoarthritis of left shoulder Acute on chronic in nature.  Has known degenerative changes within the joint. -Counseled on home exercise therapy and supportive care. -Injection today. -Could consider physical therapy.

## 2022-03-18 NOTE — Assessment & Plan Note (Signed)
Acute on chronic in nature.  Has known degenerative changes within the joint. -Counseled on home exercise therapy and supportive care. -Injection today. -Could consider physical therapy.

## 2022-03-18 NOTE — Assessment & Plan Note (Signed)
Acutely occurring.  Having pain in the groin.  She has been walking more over the past month.  Notices the pain more with getting up from a seated position. -Counseled on home exercise therapy and supportive care. -Could consider physical therapy or further imaging

## 2022-04-06 ENCOUNTER — Other Ambulatory Visit: Payer: Self-pay | Admitting: Family Medicine

## 2022-04-06 ENCOUNTER — Encounter: Payer: Self-pay | Admitting: Family Medicine

## 2022-04-06 MED ORDER — PREDNISONE 5 MG PO TABS: ORAL_TABLET | ORAL | 0 refills | Status: AC

## 2022-04-14 ENCOUNTER — Other Ambulatory Visit: Payer: Self-pay | Admitting: Family Medicine

## 2022-04-14 DIAGNOSIS — E041 Nontoxic single thyroid nodule: Secondary | ICD-10-CM

## 2022-04-27 ENCOUNTER — Ambulatory Visit: Payer: BC Managed Care – PPO | Admitting: Family Medicine

## 2022-05-02 ENCOUNTER — Inpatient Hospital Stay: Payer: BC Managed Care – PPO | Admitting: Family

## 2022-05-02 ENCOUNTER — Inpatient Hospital Stay: Payer: BC Managed Care – PPO | Attending: Hematology & Oncology

## 2022-05-02 ENCOUNTER — Other Ambulatory Visit: Payer: Self-pay | Admitting: Family

## 2022-05-02 DIAGNOSIS — D890 Polyclonal hypergammaglobulinemia: Secondary | ICD-10-CM

## 2022-05-13 ENCOUNTER — Ambulatory Visit
Admission: RE | Admit: 2022-05-13 | Discharge: 2022-05-13 | Disposition: A | Discharge status: Home / Self Care | Payer: BC Managed Care – PPO | Source: Ambulatory Visit | Attending: Family Medicine | Admitting: Family Medicine

## 2022-05-13 DIAGNOSIS — E041 Nontoxic single thyroid nodule: Secondary | ICD-10-CM

## 2022-05-19 ENCOUNTER — Ambulatory Visit (HOSPITAL_BASED_OUTPATIENT_CLINIC_OR_DEPARTMENT_OTHER)
Admission: RE | Admit: 2022-05-19 | Discharge: 2022-05-19 | Disposition: A | Discharge status: Home / Self Care | Payer: BC Managed Care – PPO | Source: Ambulatory Visit | Attending: Family Medicine | Admitting: Family Medicine

## 2022-05-19 ENCOUNTER — Inpatient Hospital Stay: Payer: BC Managed Care – PPO | Admitting: Medical Oncology

## 2022-05-19 ENCOUNTER — Encounter: Payer: Self-pay | Admitting: Family Medicine

## 2022-05-19 ENCOUNTER — Inpatient Hospital Stay: Payer: BC Managed Care – PPO

## 2022-05-19 ENCOUNTER — Ambulatory Visit (INDEPENDENT_AMBULATORY_CARE_PROVIDER_SITE_OTHER): Payer: BC Managed Care – PPO | Admitting: Family Medicine

## 2022-05-19 VITALS — BP 134/82 | Ht 67.0 in | Wt 245.0 lb

## 2022-05-19 DIAGNOSIS — M25851 Other specified joint disorders, right hip: Secondary | ICD-10-CM | POA: Diagnosis not present

## 2022-05-19 NOTE — Patient Instructions (Signed)
Good to see you Please try the voltaren over the counter gel on the area  We have made a referral to physical therapy  You can consider the compression  We'll call with the xray results.   Please send me a message in MyChart with any questions or updates.  Please see me back in 4 weeks.   --Dr. Raeford Razor

## 2022-05-19 NOTE — Assessment & Plan Note (Signed)
Acutely occurring.  She has experienced this pain over the past few months.  She notices the pain on the superior border of the labrum.  Appears more consistent with the joint space as opposed to referred pain from the lumbar spine. -Counseled on home exercise therapy and supportive care. -Hip and lumbar x-ray. -Referral to physical therapy. -Could consider injection.

## 2022-05-19 NOTE — Progress Notes (Signed)
  Laura [name] - 67 y.o. female MRN [id number]  Date of birth: 1955-07-04  SUBJECTIVE:  Including CC & ROS.  No chief complaint on file.   Laura [name] is a 66 y.o. female that is presenting with worsening right lateral anterior hip pain.  She received some improvement with the prednisone but the pain is still occurring.  Localized to this area.  No radicular pain..   Review of Systems See HPI   HISTORY: Past Medical, Surgical, Social, and Family History Reviewed & Updated per EMR.   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   Vitamin D deficiency     Past Surgical History:  Procedure Laterality Date   BREAST BIOPSY     breast biopsy    CESAREAN SECTION     1993 and [place] Left 09/05/2016   Procedure: LEFT TOTAL KNEE ARTHROPLASTY;  Surgeon: Gaynelle Arabian, MD;  Location: [facility];  Service: Orthopedics;  Laterality: Left;   TOTAL KNEE ARTHROPLASTY Right 05/11/2020   Procedure: TOTAL KNEE ARTHROPLASTY;  Surgeon: Gaynelle Arabian, MD;  Location: [facility];  Service: Orthopedics;  Laterality: Right;  43mn     PHYSICAL EXAM:  VS: BP 134/82   Ht '5\' 7"'$  (1.702 m)   Wt 245 lb (111.1 kg)   BMI 38.37 kg/m  Physical Exam Gen: NAD, alert, cooperative with exam, well-appearing MSK:  Neurovascularly intact       ASSESSMENT & PLAN:   Hip impingement syndrome, right Acutely occurring.  She  has experienced this pain over the past few months.  She notices the pain on the superior border of the labrum.  Appears more consistent with the joint space as opposed to referred pain from the lumbar spine. -Counseled on home exercise therapy and supportive care. -Hip and lumbar x-ray. -Referral to physical therapy. -Could consider injection.

## 2022-05-24 ENCOUNTER — Telehealth: Payer: Self-pay | Admitting: Family Medicine

## 2022-05-24 NOTE — Telephone Encounter (Signed)
Unable to leave VM for patient. If she calls back please have her speak with a nurse/CMA and inform that her xrays show degenerative changes in her hips that is likely the source of her pain.   If any questions then please take the best time and phone number to call and I will try to call her back.   Rosemarie Ax, MD [facility] 05/24/2022, 8:43 AM

## 2022-06-17 ENCOUNTER — Encounter: Payer: Self-pay | Admitting: Family Medicine

## 2022-06-17 ENCOUNTER — Other Ambulatory Visit: Payer: Self-pay | Admitting: Family Medicine

## 2022-06-17 MED ORDER — METHOCARBAMOL 500 MG PO TABS: 500.0000 mg | ORAL_TABLET | Freq: Two times a day (BID) | ORAL | 1 refills | Status: AC | PRN

## 2022-06-22 ENCOUNTER — Encounter: Payer: Self-pay | Admitting: Family Medicine

## 2022-06-22 ENCOUNTER — Ambulatory Visit: Payer: BC Managed Care – PPO | Admitting: Family Medicine

## 2022-06-22 VITALS — BP 136/80 | Ht 67.0 in | Wt 245.0 lb

## 2022-06-22 DIAGNOSIS — M5090 Cervical disc disorder, unspecified, unspecified cervical region: Secondary | ICD-10-CM

## 2022-06-22 DIAGNOSIS — M25851 Other specified joint disorders, right hip: Secondary | ICD-10-CM | POA: Diagnosis not present

## 2022-06-22 MED ORDER — GABAPENTIN 300 MG PO CAPS: 300.0000 mg | ORAL_CAPSULE | Freq: Three times a day (TID) | ORAL | 1 refills | Status: AC

## 2022-06-22 NOTE — Assessment & Plan Note (Signed)
Acutely occurring.  She has limited range of motion with significant degenerative changes appreciated on previous CT scan. -Counseled on home exercise therapy and supportive care. -Counseled on soft cervical collar. -Referral to physical therapy. -Counseled on hydrocodone. -Gabapentin. -Could consider trigger point injections or further imaging

## 2022-06-22 NOTE — Progress Notes (Signed)
  Laura [name] - 67 y.o. female MRN [id number]  Date of birth: October 21, 1955  SUBJECTIVE:  Including CC & ROS.  No chief complaint on file.   Laura [name] is a 67 y.o. female that is presenting with severe neck pain.  The pain has been ongoing for few weeks.  She denies any injury or inciting event.  She has limited range of motion in extension and lateral rotation.  Previous CT scan cervical spine shows significant degenerative changes from C4-C7.  No history of previous surgery.    Review of Systems See HPI   HISTORY: Past Medical, Surgical, Social, and Family History Reviewed & Updated per EMR.   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   Vitamin D deficiency     Past Surgical History:  Procedure Laterality Date   BREAST BIOPSY     breast biopsy    CESAREAN SECTION     1993 and 1997   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   Procedure: LEFT TOTAL KNEE ARTHROPLASTY;  Surgeon: Ollen Gross, MD;  Location: [facility];  Service: Orthopedics;  Laterality: Left;   TOTAL KNEE ARTHROPLASTY Right 05/11/2020   Procedure: TOTAL KNEE ARTHROPLASTY;  Surgeon: Ollen Gross, MD;  Location: [facility];  Service: Orthopedics;  Laterality: Right;      PHYSICAL EXAM:  VS: BP 136/80 (BP Location: Left Arm, Patient Position: Sitting)   Ht 5\' 7"  (1.702 m)   Wt 245 lb (111.1 kg)   BMI 38.37 kg/m  Physical Exam Gen: NAD, alert,  cooperative with exam, well-appearing MSK:  Neurovascularly intact       ASSESSMENT & PLAN:   Hip impingement syndrome, right Continues to have pain with degenerative changes appreciated on imaging. -Counseled on home exercise therapy and supportive care. -Referral to physical therapy  Cervical disc disease Acutely occurring.  She has limited range of motion with significant degenerative changes appreciated on previous CT scan. -Counseled on home exercise therapy and supportive care. -Counseled on soft cervical collar. -Referral to physical therapy. -Counseled on hydrocodone. -Gabapentin. -Could consider trigger point injections or further imaging

## 2022-06-22 NOTE — Assessment & Plan Note (Addendum)
Continues to have pain with degenerative changes appreciated on imaging. -Counseled on home exercise therapy and supportive care. -Referral to physical therapy

## 2022-06-22 NOTE — Patient Instructions (Signed)
Good to see you Please try heat  Please try the exercises  Please use the soft collar intermittently  We have made a referral to physical therapy  Please start with one pill of gabapentin at night. You can increase this to 2 or 3 times daily   Please send me a message in MyChart with any questions or updates.  Please see me back in 1-2 weeks.   --Dr. Jordan Likes

## 2022-06-25 IMAGING — US US FNA BIOPSY THYROID 1ST LESION
1 series · 10 of 10 positions shown · non-contrast
Comparison: US Thyroid 05/10/21

MEDICATIONS:
None

COMPLICATIONS:
None immediate.

INDICATION: Indeterminate thyroid nodule

EXAM:
ULTRASOUND GUIDED FINE NEEDLE ASPIRATION OF INDETERMINATE THYROID
NODULE
TECHNIQUE: Informed written consent was obtained from the patient after a
discussion of the risks, benefits and alternatives to treatment.
Questions regarding the procedure were encouraged and answered. A
timeout was performed prior to the initiation of the procedure.

[Series 1: us fna biopsy thyroid 1st lesion · 0.06mm/px · 10 acquisitions, 10 frames shown]
[im 1/10]
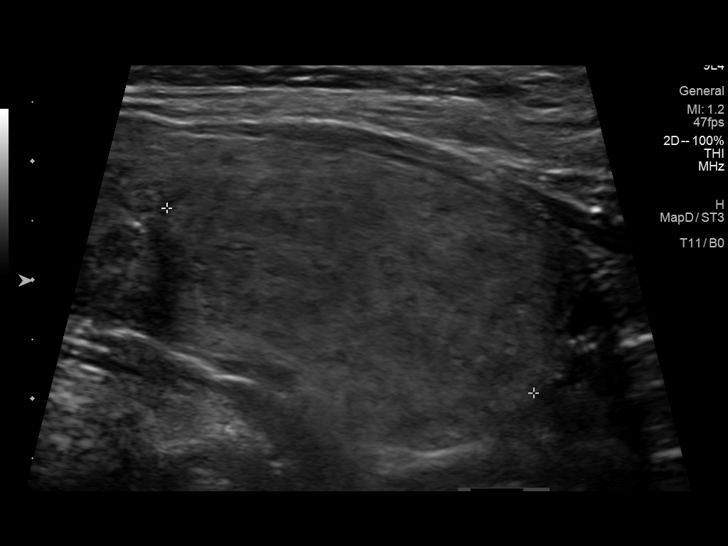
[im 2/10]
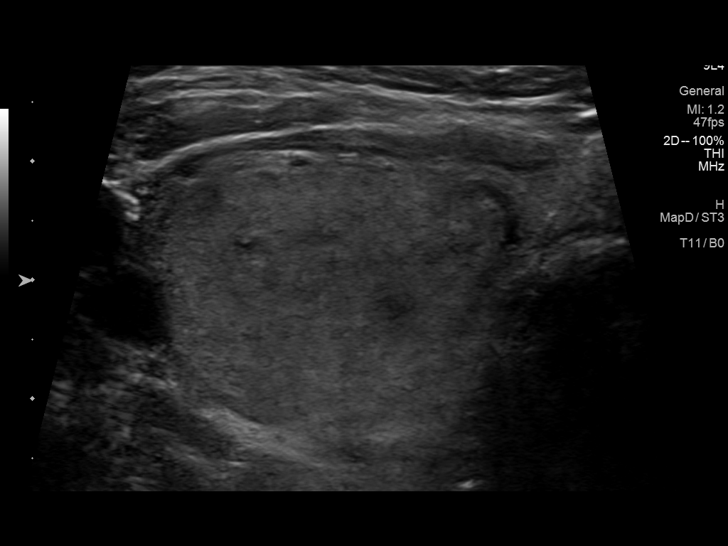
[im 3/10]
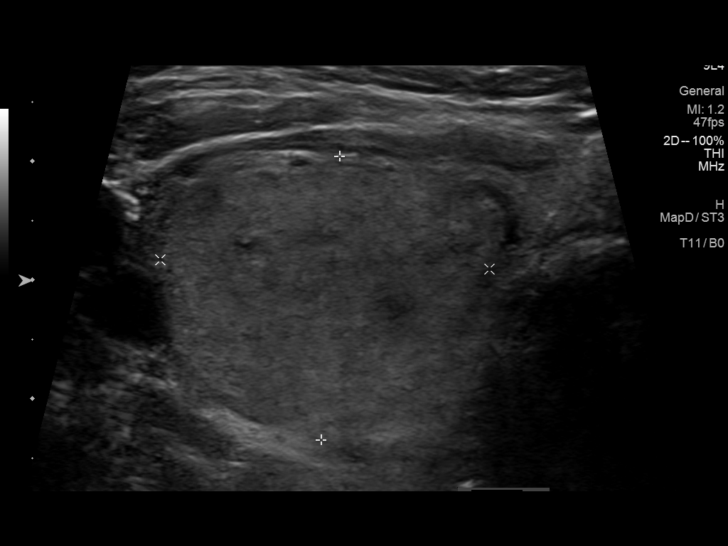
[im 4/10]
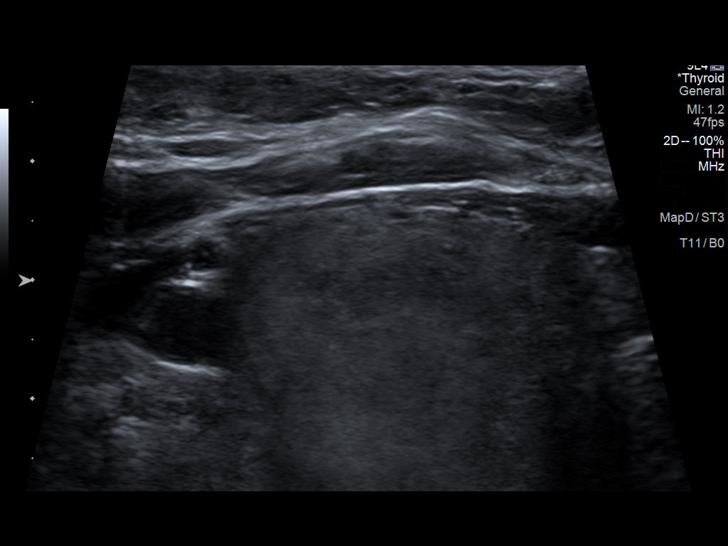
[im 5/10]
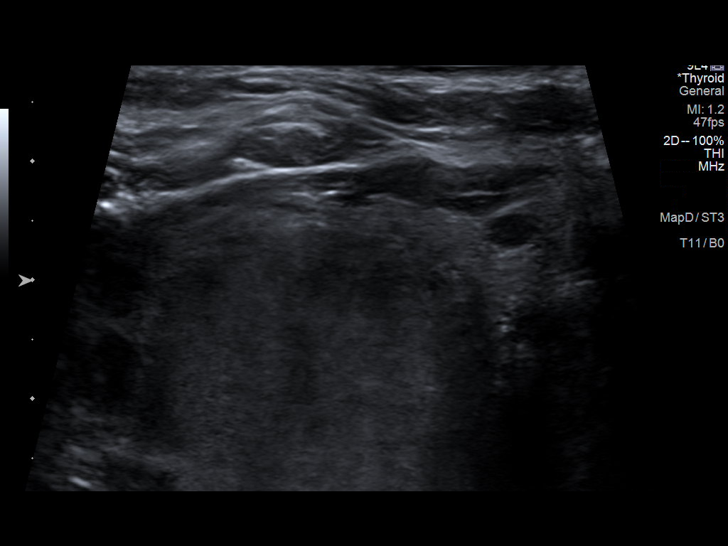
[im 6/10]
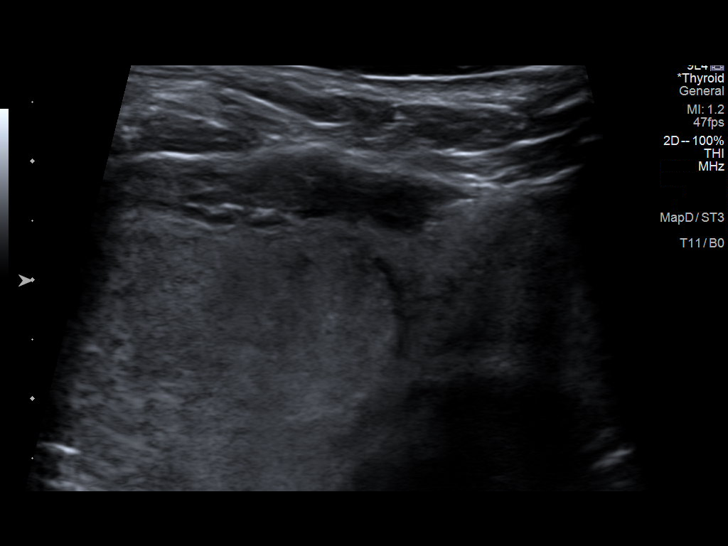
[im 7/10]
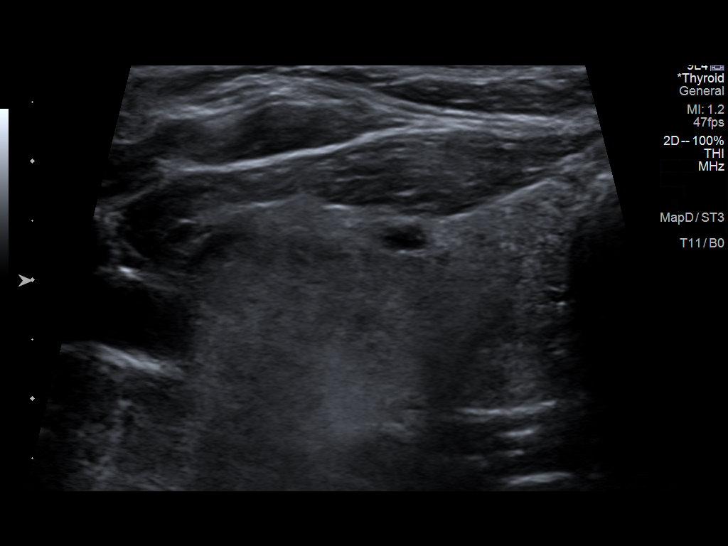
[im 8/10]
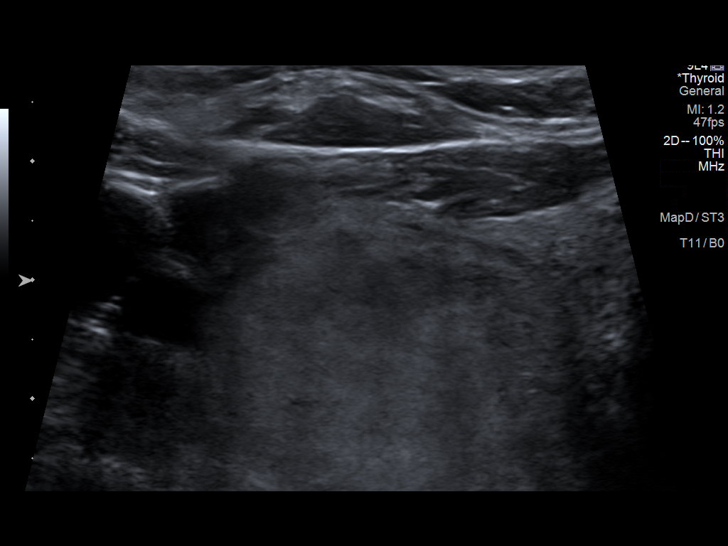
[im 9/10]
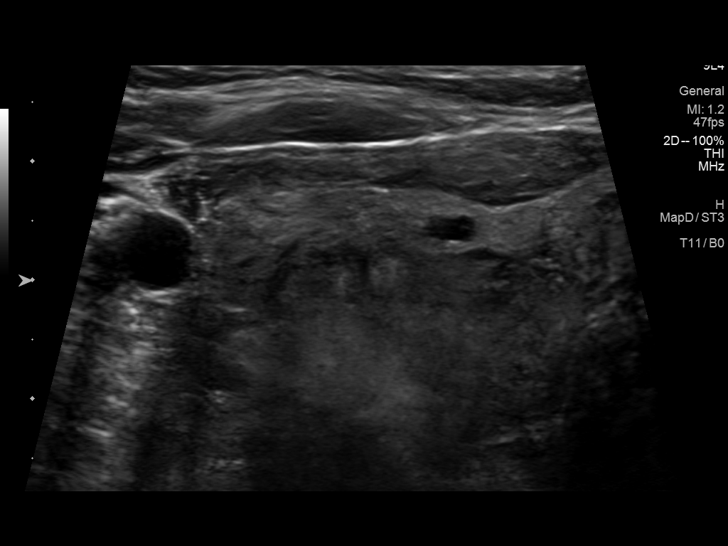
[im 10/10]
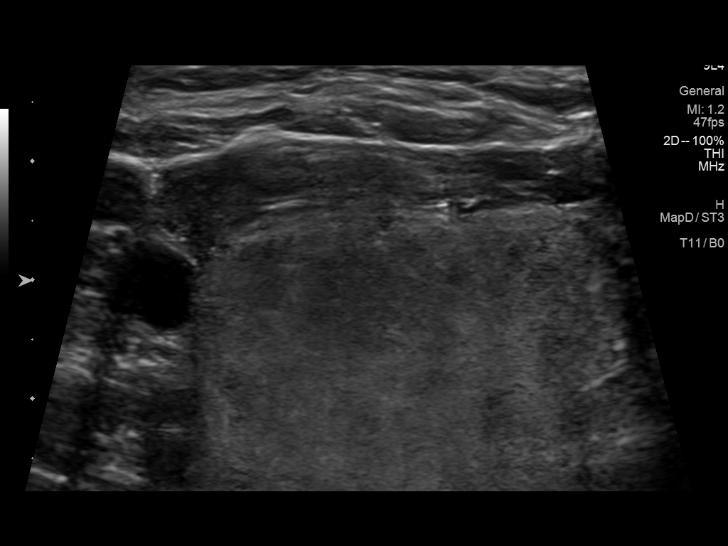

[10 of 10 positions shown; findings below may reference images not displayed]

Pre-procedural ultrasound scanning demonstrated unchanged size and
appearance of the indeterminate nodule within the right lobe of the
thyroid

The procedure was planned. The neck was prepped in the usual sterile
fashion, and a sterile drape was applied covering the operative
field. A timeout was performed prior to the initiation of the
procedure. Local anesthesia was provided with 1% lidocaine.

Under direct ultrasound guidance, 5 FNA biopsies were performed of
the nodule with a 27 gauge needle. Multiple ultrasound images were
saved for procedural documentation purposes. The samples were
prepared and submitted to pathology. Two of the specimens were
reserved for Afirma testing.

Limited post procedural scanning was negative for hematoma or
additional complication. Dressings were placed. The patient
tolerated the above procedures procedure well without immediate
postprocedural complication.
FINDINGS: Nodule reference number based on prior diagnostic ultrasound: 2

Maximum size: 3.6 cm

Location: Right; inferior

ACR TI-RADS risk category: TR3 (3 points)

Reason for biopsy: meets ACR TI-RADS criteria

Ultrasound imaging confirms appropriate placement of the needles
within the thyroid nodule.
IMPRESSION: Technically successful ultrasound guided fine needle aspiration of
right inferior lobe nodule the thyroid

Performed and read by Justo, Kojo

## 2022-06-27 ENCOUNTER — Encounter: Payer: Self-pay | Admitting: *Deleted

## 2022-07-07 ENCOUNTER — Other Ambulatory Visit: Payer: Self-pay

## 2022-07-07 ENCOUNTER — Ambulatory Visit (INDEPENDENT_AMBULATORY_CARE_PROVIDER_SITE_OTHER): Payer: BC Managed Care – PPO | Admitting: Family Medicine

## 2022-07-07 ENCOUNTER — Encounter: Payer: Self-pay | Admitting: Family Medicine

## 2022-07-07 VITALS — Ht 67.0 in | Wt 245.0 lb

## 2022-07-07 DIAGNOSIS — M5090 Cervical disc disorder, unspecified, unspecified cervical region: Secondary | ICD-10-CM | POA: Diagnosis not present

## 2022-07-07 DIAGNOSIS — M25551 Pain in right hip: Secondary | ICD-10-CM

## 2022-07-07 MED ORDER — METHYLPREDNISOLONE ACETATE 40 MG/ML IJ SUSP
40.0000 mg | Freq: Once | INTRAMUSCULAR | Status: AC
  Administered 2022-07-07: 40 mg via INTRA_ARTICULAR

## 2022-07-07 NOTE — Patient Instructions (Signed)
Good to see you Please alternate heat and ice  Please continue the exercises  Please follow up if the pain continues despite the injection   Please send me a message in MyChart with any questions or updates.  Please see me back in 4-6 weeks.   --Dr. Jordan Likes

## 2022-07-07 NOTE — Assessment & Plan Note (Signed)
Acutely worsening.  Having more pain laterally.  She does have degenerative changes of the hip but now localizes the pain in the greater trochanteric area. -Counseled on home exercise therapy and supportive care. - Injection today. - Could consider intra-articular injection

## 2022-07-07 NOTE — Progress Notes (Signed)
  Laura [name] - 67 y.o. female MRN [id number]  Date of birth: 01/16/1956  SUBJECTIVE:  Including CC & ROS.  No chief complaint on file.   Laura [name] is a 67 y.o. female that is following up for her neck pain and presenting with acute worsening of her right hip pain.  The neck pain is slowly improving.  The lateral hip pain is becoming debilitating.    Review of Systems See HPI   HISTORY: Past Medical, Surgical, Social, and Family History Reviewed & Updated per EMR.   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   Vitamin D deficiency     Past Surgical History:  Procedure Laterality Date   BREAST BIOPSY     breast biopsy    CESAREAN SECTION     1993 and 1997   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   Procedure: LEFT TOTAL KNEE ARTHROPLASTY;  Surgeon: Ollen Gross, MD;  Location: [facility];  Service: Orthopedics;  Laterality: Left;   TOTAL KNEE ARTHROPLASTY Right 05/11/2020   Procedure: TOTAL KNEE ARTHROPLASTY;  Surgeon: Ollen Gross, MD;  Location: [facility];  Service: Orthopedics;  Laterality: Right;      PHYSICAL EXAM:  VS: Ht  (1.702 m)   Wt 245 lb (111.1 kg)   BMI 38.37 kg/m  Physical Exam Gen: NAD, alert, cooperative with exam, well-appearing MSK:  Neurovascularly intact     Aspiration/Injection Procedure Note BLEU MOISAN 12-01-1955  Procedure: Injection Indications: Right hip  pain  Procedure Details Consent: Risks of procedure as well as the alternatives and risks of each were explained to the (patient/caregiver).  Consent for procedure obtained. Time Out: Verified patient identification, verified procedure, site/side was marked, verified correct patient position, special equipment/implants available, medications/allergies/relevent history reviewed, required imaging and test results available.  Performed.  The area was cleaned with iodine and alcohol swabs.    The right trochanteric bursa was injected using 1 cc's of 40 mg Depo-Medrol and 4 cc's of 0.25% bupivacaine was injected on a 22-gauge 3-1/2 inch needle.  Ultrasound was used. Images were obtained in short views showing the injection.     A sterile dressing was applied.  Patient did tolerate procedure well.     ASSESSMENT & PLAN:   Cervical disc disease Slowly getting improvement in her pain and function. -Counseled on home exercise therapy and supportive care. 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  Having more pain laterally.  She does have degenerative changes of the hip but now localizes the pain in the greater trochanteric area. -Counseled on home exercise therapy and supportive care. - Injection today. - Could consider intra-articular injection

## 2022-07-07 NOTE — Assessment & Plan Note (Signed)
Slowly getting improvement in her pain and function. -Counseled on home exercise therapy and supportive care. -Could consider further imaging

## 2022-07-19 ENCOUNTER — Other Ambulatory Visit: Payer: Self-pay

## 2022-07-19 ENCOUNTER — Ambulatory Visit (INDEPENDENT_AMBULATORY_CARE_PROVIDER_SITE_OTHER): Payer: BC Managed Care – PPO | Admitting: Family Medicine

## 2022-07-19 ENCOUNTER — Encounter: Payer: Self-pay | Admitting: Family Medicine

## 2022-07-19 VITALS — BP 102/78 | Ht 67.0 in | Wt 245.0 lb

## 2022-07-19 DIAGNOSIS — M1611 Unilateral primary osteoarthritis, right hip: Secondary | ICD-10-CM

## 2022-07-19 MED ORDER — TRIAMCINOLONE ACETONIDE 40 MG/ML IJ SUSP
40.0000 mg | Freq: Once | INTRAMUSCULAR | Status: AC
  Administered 2022-07-19: 40 mg via INTRA_ARTICULAR

## 2022-07-19 NOTE — Assessment & Plan Note (Signed)
Acutely worsening with limping and limited hip flexion.  No improvement with greater trochanteric injection. -Counseled on home exercise therapy and supportive care. -Intra-articular injection today. - Could consider physical therapy or further imaging

## 2022-07-19 NOTE — Patient Instructions (Signed)
Good to see you Please alternate heat and ice  Please continue the exercises   Please send me a message in MyChart with any questions or updates.  Please see me back as needed.   --Dr. Jordan Likes

## 2022-07-19 NOTE — Progress Notes (Signed)
  Laura [name] - 67 y.o. female MRN [id number]  Date of birth: [birth year]  SUBJECTIVE:  Including CC & ROS.  No chief complaint on file.   KALIANA [name] is a 67 y.o. female that is presenting with worsening of her right hip pain.  Received a greater trochanter bursa injection with no improvement.    Review of Systems See HPI   HISTORY: Past Medical, Surgical, Social, and Family History Reviewed & Updated per EMR.   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   Vitamin D deficiency     Past Surgical History:  Procedure Laterality Date   BREAST BIOPSY     breast biopsy    CESAREAN SECTION     1993 and 1997   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   Procedure: LEFT TOTAL KNEE ARTHROPLASTY;  Surgeon: Ollen Gross, MD;  Location: [facility];  Service: Orthopedics;  Laterality: Left;   TOTAL KNEE ARTHROPLASTY Right 05/11/2020   Procedure: TOTAL KNEE ARTHROPLASTY;  Surgeon: Ollen Gross, MD;  Location: [facility];  Service: Orthopedics;  Laterality: Right;      PHYSICAL EXAM:  VS: BP 102/78 (BP Location: Left Arm, Patient Position: Sitting)   Ht 5\' 7"  (1.702 m)   Wt 245 lb (111.1 kg)   BMI 38.37 kg/m  Physical Exam Gen: NAD, alert, cooperative with exam, well-appearing MSK:  Neurovascularly intact     Aspiration/Injection Procedure Note Laura [name] [birth year]  Procedure: Injection Indications:  Right hip pain  Procedure Details Consent: Risks of procedure as well as the alternatives and risks of each were explained to the (patient/caregiver).  Consent for procedure obtained. Time Out: Verified patient identification, verified procedure, site/side was marked, verified correct patient position, special equipment/implants available, medications/allergies/relevent history reviewed, required imaging and test results available.  Performed.  The area was cleaned with iodine and alcohol swabs.    The right hip joint was injected using 4 cc of 1% lidocaine and 0.4 cc of 8.4% sodium bicarbonate on a 22-gauge 3-1/2 inch needle.  The syringe was switched to mixture containing 1 cc's of 40 mg Kenalog and 4 cc's of 0.25% bupivacaine was injected.  Ultrasound was used. Images were obtained in short views showing the injection.     A sterile dressing was applied.  Patient did tolerate procedure well.     ASSESSMENT & PLAN:   OA (osteoarthritis) of hip Acutely worsening with limping and limited hip flexion.  No improvement with greater trochanteric injection. -Counseled on home exercise therapy and supportive care. -Intra-articular injection today. - Could consider physical therapy or further imaging

## 2022-08-05 ENCOUNTER — Other Ambulatory Visit: Payer: Self-pay | Admitting: Family Medicine

## 2022-08-05 ENCOUNTER — Encounter: Payer: Self-pay | Admitting: Family Medicine

## 2022-08-05 DIAGNOSIS — M1611 Unilateral primary osteoarthritis, right hip: Secondary | ICD-10-CM

## 2022-09-13 ENCOUNTER — Ambulatory Visit (INDEPENDENT_AMBULATORY_CARE_PROVIDER_SITE_OTHER): Payer: BC Managed Care – PPO | Admitting: Sports Medicine

## 2022-09-13 VITALS — BP 131/71 | Ht 66.5 in | Wt 240.0 lb

## 2022-09-13 DIAGNOSIS — M170 Bilateral primary osteoarthritis of knee: Secondary | ICD-10-CM | POA: Diagnosis not present

## 2022-09-13 DIAGNOSIS — M1611 Unilateral primary osteoarthritis, right hip: Secondary | ICD-10-CM

## 2022-09-13 NOTE — Assessment & Plan Note (Signed)
Now she is status post bilateral knee replacements The knees do appear stable on examination I do not think her right knee is the source of her pain  We will give her some limited quadricep strengthening exercises along with her hip exercises to try to improve function

## 2022-09-13 NOTE — Assessment & Plan Note (Signed)
The right hip has advanced osteoarthritis and I believe this is causing the radiation of pain into her right knee We will give her hip abduction series of exercises She should do these bilaterally We will give her a couple of exercises to strengthen the quadriceps  Began Tylenol for pain 2-3 times a day Try topical Voltaren around the outside of the knee if that is painful  Recheck in about 6 weeks She could be a candidate for injection of the right hip

## 2022-09-13 NOTE — Progress Notes (Signed)
Chief complaint right hip and right lateral knee pain  Patient has advanced osteoarthritis of her knees and has undergone 2 knee replacements by Dr. Merlyn Albert She is now having more pain in her right groin and hip on the lateral aspect This pain seems to come down the outside of her right leg to her lateral knee  She has had bilateral pulmonary emboli in the past and is on anticoagulation Anti-inflammatories do help but she cannot take these orally  She comes for evaluation and advice  Physical exam Obese but pleasant black female in no acute distress BP 131/71   Ht 5' 6.5" (1.689 m)   Wt 240 lb (108.9 kg)   BMI 38.16 kg/m   Significant limitation of rotation of the right hip Limitation of flexion of the right hip Compared to the left hip she has about 40% less motion  Evaluation of the hip abduction strength reveals that she has marked weakness bilaterally but worse on the right  Bilateral knees reveal a large midline scar Evaluation of knee stability reveals that she is very stable to translation and horizontal motion She has good flexion There is no palpable tenderness laterally where she experiences pain

## 2022-11-03 ENCOUNTER — Ambulatory Visit: Payer: BC Managed Care – PPO | Admitting: Sports Medicine

## 2022-11-22 ENCOUNTER — Ambulatory Visit: Payer: BC Managed Care – PPO | Admitting: Sports Medicine

## 2023-01-13 ENCOUNTER — Other Ambulatory Visit: Payer: Self-pay | Admitting: Family Medicine

## 2023-01-13 DIAGNOSIS — E2839 Other primary ovarian failure: Secondary | ICD-10-CM

## 2023-01-27 ENCOUNTER — Other Ambulatory Visit: Payer: Self-pay | Admitting: Family Medicine

## 2023-01-27 DIAGNOSIS — Z1231 Encounter for screening mammogram for malignant neoplasm of breast: Secondary | ICD-10-CM

## 2023-02-03 NOTE — Progress Notes (Signed)
 Chief Complaint  Patient presents with  . Skin Check    SUBJECTIVE: Ms. Niese is a 67 y.o. female who is a return patient to the clinic for evaluation of atopic dermatitis. She currently uses TAC 0.1% cream which has provided significant improvement. She uses a humidifier at home. She feels that her eczema worsens in the winter and improves in the summer.   ROS:  Constitutional: The patient states that they otherwise feel well.  Skin: Negative for any other lumps, bumps, or skin concerns.   Past medical history, family history, and social history is noted in the encounter and has been updated and reviewed.  Past Medical History History reviewed. No pertinent past medical history. Family History Family History  Problem Relation Name Age of Onset  . Eczema Neg Hx    . Psoriasis Neg Hx     Social History Social History   Socioeconomic History  . Marital status: Married    Spouse name: Not on file  . Number of children: Not on file  . Years of education: Not on file  . Highest education level: Not on file  Occupational History  . Not on file  Tobacco Use  . Smoking status: Never  . Smokeless tobacco: Never  Substance and Sexual Activity  . Alcohol use: Yes  . Drug use: Not on file  . Sexual activity: Not on file  Other Topics Concern  . Not on file  Social History Narrative  . Not on file   Social Determinants of Health   Food Insecurity: Not on file  Transportation Needs: Not on file  Safety: Not on file  Living Situation: Not on file   OBJECTIVE: Ht 1.676 m (5' 6)   Wt 118 kg (260 lb)   BMI 41.97 kg/m   Patient is a healthy appearing 67 y.o. female who appears A&Ox3 with appropriate mood and affect.  Skin examination was performed via inspection and palpation of the head (face, ears), eyelids, lips, neck, chest, abdomen, bilateral upper extremities, bilateral hands, fingers, fingernails, thighs. The areas covered by the patient's undergarments were not  examined today. The patient does not note any areas of concern underneath their undergarments today.  Significant exam findings include:  Dry, scaly patches on the right breast, inner thighs, bilateral forearms.  BSA: <6%  There are no other significant exam findings on patient's skin exam today  ASSESSMENT:  1. Intrinsic atopic dermatitis  triamcinolone  acetonide (KENALOG ) 0.1 % cream   hydrocortisone 2.5 % cream      PLAN: Extensive counseling was provided to the patient regarding the diagnosis and treatment options. Recommend a fragrance free skin regimen.  Procedures performed today:  none Prescriptions given today:  TAC 0.1% cream , apply to the body as needed. Never to the face.  Hydrocortisone 2.5% cream, apply to the face as needed.   Advised monthly self total body skin examination and for the patient to report any new or changing lesions. Advised the use of photoprotection including the use of over the counter sunscreens and photoprotective clothing. We discussed the ABCDE's of melanoma. The patient was encouraged to call or send a message through [facility] for any questions or concerns.  Return in about 1 year (around 02/03/2024). - but sooner as needed.

## 2023-03-03 ENCOUNTER — Ambulatory Visit: Payer: BC Managed Care – PPO

## 2023-04-25 ENCOUNTER — Ambulatory Visit: Payer: Medicare Other | Admitting: Sports Medicine

## 2023-05-02 ENCOUNTER — Ambulatory Visit (INDEPENDENT_AMBULATORY_CARE_PROVIDER_SITE_OTHER): Payer: Medicare Other | Admitting: Sports Medicine

## 2023-05-02 VITALS — BP 138/66 | Ht 66.0 in | Wt 269.0 lb

## 2023-05-02 DIAGNOSIS — M25551 Pain in right hip: Secondary | ICD-10-CM | POA: Diagnosis present

## 2023-05-02 MED ORDER — TRAMADOL HCL 50 MG PO TABS: 50.0000 mg | ORAL_TABLET | Freq: Four times a day (QID) | ORAL | 2 refills | Status: AC | PRN

## 2023-05-02 NOTE — Progress Notes (Signed)
Chief complaint right hip and knee pain  Patient was scheduled for a total hip replacement on the right but because her BMI was above 40 this was canceled until she loses 20 pounds Currently she is having significant pain both at the hip but a lot at the lateral right knee She cannot take NSAIDs because she is on Xarelto She does take Tylenol but only gets minimal relief She does not want to take oxycodone so I discontinued this We discussed tramadol which she is taken in the past and that did help some  She has a history of both knees being replaced but they are actually working pretty well  Physical exam Obese black female in no acute distress BP 138/66   Ht 5\' 6"  (1.676 m)   Wt 269 lb (122 kg)   BMI 43.42 kg/m  Right knee exam is positive for a midline scar but she has good motion and no pain with motion of the knee.  Her location of pain is more over the iliotibial band and lateral knee Hip motion is significantly limited Hip abduction is somewhat weak and hip flexion is moderately weak

## 2023-05-02 NOTE — Assessment & Plan Note (Signed)
Currently she is having greater trochanteric pain type with radiation down her iliotibial band This is probably related to her hip osteoarthritis and the compensation she makes for it We will give her tramadol 50 3 times daily to help with pain Given chair exercises to work on hip abduction and flexion strength  I did suggest trying intermittent fasting to see if she could achieve her weight loss Hopefully she will be able to do her hip replacement surgery in April Fortunately she did well with both knee replacement surgeries  I will see her back as needed

## 2023-05-19 ENCOUNTER — Ambulatory Visit: Payer: BLUE CROSS/BLUE SHIELD

## 2023-05-22 ENCOUNTER — Other Ambulatory Visit: Payer: Self-pay | Admitting: Nurse Practitioner

## 2023-05-22 DIAGNOSIS — E041 Nontoxic single thyroid nodule: Secondary | ICD-10-CM

## 2023-05-23 ENCOUNTER — Ambulatory Visit: Admitting: Sports Medicine

## 2023-05-26 ENCOUNTER — Ambulatory Visit
Admission: RE | Admit: 2023-05-26 | Discharge: 2023-05-26 | Disposition: A | Discharge status: Home / Self Care | Source: Ambulatory Visit | Attending: Family Medicine | Admitting: Family Medicine

## 2023-05-26 DIAGNOSIS — Z1231 Encounter for screening mammogram for malignant neoplasm of breast: Secondary | ICD-10-CM

## 2023-06-02 ENCOUNTER — Ambulatory Visit
Admission: RE | Admit: 2023-06-02 | Discharge: 2023-06-02 | Disposition: A | Discharge status: Home / Self Care | Source: Ambulatory Visit | Attending: Nurse Practitioner | Admitting: Nurse Practitioner

## 2023-06-02 DIAGNOSIS — E041 Nontoxic single thyroid nodule: Secondary | ICD-10-CM

## 2023-09-18 ENCOUNTER — Other Ambulatory Visit: Payer: BC Managed Care – PPO

## 2024-01-16 ENCOUNTER — Other Ambulatory Visit: Payer: Self-pay | Admitting: General Surgery

## 2024-01-16 DIAGNOSIS — D1721 Benign lipomatous neoplasm of skin and subcutaneous tissue of right arm: Secondary | ICD-10-CM

## 2024-01-26 ENCOUNTER — Other Ambulatory Visit

## 2024-02-06 ENCOUNTER — Other Ambulatory Visit (HOSPITAL_BASED_OUTPATIENT_CLINIC_OR_DEPARTMENT_OTHER): Payer: Self-pay | Admitting: Family Medicine

## 2024-02-06 DIAGNOSIS — E2839 Other primary ovarian failure: Secondary | ICD-10-CM

## 2024-02-16 ENCOUNTER — Inpatient Hospital Stay: Admission: RE | Admit: 2024-02-16 | Discharge: 2024-02-16 | Attending: General Surgery | Admitting: General Surgery

## 2024-02-16 DIAGNOSIS — D1721 Benign lipomatous neoplasm of skin and subcutaneous tissue of right arm: Secondary | ICD-10-CM

## 2024-04-12 ENCOUNTER — Telehealth: Payer: Self-pay

## 2024-04-12 ENCOUNTER — Inpatient Hospital Stay

## 2024-04-12 ENCOUNTER — Inpatient Hospital Stay: Admitting: Oncology

## 2024-05-06 ENCOUNTER — Other Ambulatory Visit (HOSPITAL_BASED_OUTPATIENT_CLINIC_OR_DEPARTMENT_OTHER)

## 2024-05-15 ENCOUNTER — Inpatient Hospital Stay

## 2024-05-15 ENCOUNTER — Inpatient Hospital Stay: Admitting: Hematology
